# Patient Record
Sex: Female | Born: 1950 | Race: White | Hispanic: No | Marital: Married | Smoking: Former smoker
Health system: Southern US, Community
[De-identification: ages and names within clinical notes are randomized; demographics above are authoritative.]

## PROBLEM LIST (undated history)

## (undated) DIAGNOSIS — A6920 Lyme disease, unspecified: Secondary | ICD-10-CM

## (undated) DIAGNOSIS — M797 Fibromyalgia: Secondary | ICD-10-CM

## (undated) DIAGNOSIS — I499 Cardiac arrhythmia, unspecified: Secondary | ICD-10-CM

## (undated) DIAGNOSIS — S161XXA Strain of muscle, fascia and tendon at neck level, initial encounter: Secondary | ICD-10-CM

## (undated) DIAGNOSIS — R51 Headache: Secondary | ICD-10-CM

## (undated) DIAGNOSIS — C801 Malignant (primary) neoplasm, unspecified: Secondary | ICD-10-CM

## (undated) DIAGNOSIS — E039 Hypothyroidism, unspecified: Secondary | ICD-10-CM

## (undated) DIAGNOSIS — J84112 Idiopathic pulmonary fibrosis: Secondary | ICD-10-CM

## (undated) DIAGNOSIS — Z720 Tobacco use: Secondary | ICD-10-CM

## (undated) DIAGNOSIS — Z8739 Personal history of other diseases of the musculoskeletal system and connective tissue: Secondary | ICD-10-CM

## (undated) DIAGNOSIS — I739 Peripheral vascular disease, unspecified: Secondary | ICD-10-CM

## (undated) DIAGNOSIS — I8290 Acute embolism and thrombosis of unspecified vein: Secondary | ICD-10-CM

## (undated) DIAGNOSIS — Z87898 Personal history of other specified conditions: Secondary | ICD-10-CM

## (undated) DIAGNOSIS — J8410 Pulmonary fibrosis, unspecified: Secondary | ICD-10-CM

## (undated) DIAGNOSIS — K219 Gastro-esophageal reflux disease without esophagitis: Secondary | ICD-10-CM

## (undated) DIAGNOSIS — E785 Hyperlipidemia, unspecified: Secondary | ICD-10-CM

## (undated) DIAGNOSIS — I10 Essential (primary) hypertension: Secondary | ICD-10-CM

## (undated) HISTORY — DX: Acute embolism and thrombosis of unspecified vein: I82.90

## (undated) HISTORY — DX: Personal history of other specified conditions: Z87.898

## (undated) HISTORY — DX: Gastro-esophageal reflux disease without esophagitis: K21.9

## (undated) HISTORY — PX: VAGINA SURGERY: SHX829

## (undated) HISTORY — PX: TENDON REPAIR: SHX5111

## (undated) HISTORY — DX: Essential (primary) hypertension: I10

## (undated) HISTORY — PX: ABDOMINAL HYSTERECTOMY: SHX81

## (undated) HISTORY — PX: LUMBAR SPINE SURGERY: SHX701

## (undated) HISTORY — DX: Tobacco use: Z72.0

## (undated) HISTORY — DX: Hypothyroidism, unspecified: E03.9

## (undated) HISTORY — DX: Hyperlipidemia, unspecified: E78.5

## (undated) HISTORY — DX: Peripheral vascular disease, unspecified: I73.9

## (undated) HISTORY — PX: TUBAL LIGATION: SHX77

## (undated) HISTORY — PX: TONSILLECTOMY: SUR1361

## (undated) HISTORY — PX: OVARY SURGERY: SHX727

## (undated) HISTORY — DX: Headache: R51

## (undated) HISTORY — PX: ULNAR NERVE TRANSPOSITION: SHX2595

## (undated) HISTORY — DX: Idiopathic pulmonary fibrosis: J84.112

## (undated) HISTORY — DX: Cardiac arrhythmia, unspecified: I49.9

## (undated) HISTORY — DX: Malignant (primary) neoplasm, unspecified: C80.1

## (undated) HISTORY — DX: Strain of muscle, fascia and tendon at neck level, initial encounter: S16.1XXA

## (undated) HISTORY — DX: Lyme disease, unspecified: A69.20

## (undated) HISTORY — DX: Fibromyalgia: M79.7

## (undated) HISTORY — PX: VULVECTOMY PARTIAL: SHX6187

## (undated) HISTORY — DX: Personal history of other diseases of the musculoskeletal system and connective tissue: Z87.39

---

## 1988-12-05 HISTORY — PX: BREAST BIOPSY: SHX20

## 2004-11-02 ENCOUNTER — Ambulatory Visit: Payer: Self-pay

## 2005-12-22 ENCOUNTER — Ambulatory Visit: Payer: Self-pay

## 2006-03-31 ENCOUNTER — Ambulatory Visit: Payer: Self-pay

## 2007-09-18 ENCOUNTER — Ambulatory Visit: Payer: Self-pay

## 2008-03-25 ENCOUNTER — Ambulatory Visit: Payer: Self-pay

## 2008-07-25 ENCOUNTER — Encounter: Admission: RE | Admit: 2008-07-25 | Discharge: 2008-07-25 | Payer: Self-pay | Admitting: Neurology

## 2008-10-10 ENCOUNTER — Encounter: Admission: RE | Admit: 2008-10-10 | Discharge: 2008-10-10 | Payer: Self-pay | Admitting: Neurology

## 2008-12-29 ENCOUNTER — Encounter: Admission: RE | Admit: 2008-12-29 | Discharge: 2008-12-29 | Payer: Self-pay | Admitting: Neurology

## 2009-01-08 ENCOUNTER — Encounter: Admission: RE | Admit: 2009-01-08 | Discharge: 2009-01-08 | Payer: Self-pay | Admitting: Neurology

## 2009-03-20 ENCOUNTER — Encounter: Admission: RE | Admit: 2009-03-20 | Discharge: 2009-03-20 | Payer: Self-pay | Admitting: Neurology

## 2010-03-15 ENCOUNTER — Ambulatory Visit: Payer: Self-pay

## 2011-03-18 ENCOUNTER — Ambulatory Visit: Payer: Self-pay

## 2013-01-25 DIAGNOSIS — M47817 Spondylosis without myelopathy or radiculopathy, lumbosacral region: Secondary | ICD-10-CM | POA: Insufficient documentation

## 2013-01-25 DIAGNOSIS — R5382 Chronic fatigue, unspecified: Secondary | ICD-10-CM | POA: Insufficient documentation

## 2013-01-25 DIAGNOSIS — G039 Meningitis, unspecified: Secondary | ICD-10-CM | POA: Insufficient documentation

## 2013-01-25 DIAGNOSIS — Z5181 Encounter for therapeutic drug level monitoring: Secondary | ICD-10-CM | POA: Insufficient documentation

## 2013-01-25 DIAGNOSIS — M79609 Pain in unspecified limb: Secondary | ICD-10-CM | POA: Insufficient documentation

## 2013-01-25 DIAGNOSIS — M4800 Spinal stenosis, site unspecified: Secondary | ICD-10-CM | POA: Insufficient documentation

## 2013-01-25 DIAGNOSIS — IMO0001 Reserved for inherently not codable concepts without codable children: Secondary | ICD-10-CM | POA: Insufficient documentation

## 2013-02-14 ENCOUNTER — Ambulatory Visit: Payer: Self-pay

## 2013-05-01 ENCOUNTER — Encounter: Payer: Self-pay | Admitting: Neurology

## 2013-05-09 ENCOUNTER — Ambulatory Visit: Payer: Self-pay

## 2013-06-10 ENCOUNTER — Encounter: Payer: Self-pay | Admitting: Neurology

## 2013-06-10 DIAGNOSIS — Z5181 Encounter for therapeutic drug level monitoring: Secondary | ICD-10-CM

## 2013-06-10 DIAGNOSIS — M47817 Spondylosis without myelopathy or radiculopathy, lumbosacral region: Secondary | ICD-10-CM

## 2013-06-10 DIAGNOSIS — G039 Meningitis, unspecified: Secondary | ICD-10-CM

## 2013-06-10 DIAGNOSIS — M79609 Pain in unspecified limb: Secondary | ICD-10-CM

## 2013-06-10 DIAGNOSIS — R5382 Chronic fatigue, unspecified: Secondary | ICD-10-CM

## 2013-06-10 DIAGNOSIS — M4800 Spinal stenosis, site unspecified: Secondary | ICD-10-CM

## 2013-06-10 DIAGNOSIS — IMO0001 Reserved for inherently not codable concepts without codable children: Secondary | ICD-10-CM

## 2013-06-11 ENCOUNTER — Telehealth: Payer: Self-pay | Admitting: Neurology

## 2013-06-11 ENCOUNTER — Encounter: Payer: Self-pay | Admitting: Neurology

## 2013-06-11 ENCOUNTER — Ambulatory Visit (INDEPENDENT_AMBULATORY_CARE_PROVIDER_SITE_OTHER): Payer: BC Managed Care – PPO | Admitting: Neurology

## 2013-06-11 VITALS — BP 120/71 | HR 95 | Ht 64.5 in | Wt 134.0 lb

## 2013-06-11 DIAGNOSIS — Z5181 Encounter for therapeutic drug level monitoring: Secondary | ICD-10-CM

## 2013-06-11 DIAGNOSIS — M79609 Pain in unspecified limb: Secondary | ICD-10-CM

## 2013-06-11 DIAGNOSIS — IMO0001 Reserved for inherently not codable concepts without codable children: Secondary | ICD-10-CM

## 2013-06-11 LAB — CBC WITH DIFFERENTIAL
Hemoglobin: 11.6 g/dL (ref 11.1–15.9)
Lymphocytes Absolute: 2.4 10*3/uL (ref 0.7–3.1)
MCH: 31.4 pg (ref 26.6–33.0)
MCHC: 34.1 g/dL (ref 31.5–35.7)
Monocytes: 6 % (ref 4–12)
Neutrophils Absolute: 5 10*3/uL (ref 1.4–7.0)
Platelets: 333 10*3/uL (ref 150–379)

## 2013-06-11 LAB — COMPREHENSIVE METABOLIC PANEL
AST: 15 IU/L (ref 0–40)
Albumin/Globulin Ratio: 2.1 (ref 1.1–2.5)
Albumin: 4.4 g/dL (ref 3.6–4.8)
BUN: 14 mg/dL (ref 8–27)
CO2: 32 mmol/L — ABNORMAL HIGH (ref 18–29)
Chloride: 93 mmol/L — ABNORMAL LOW (ref 96–108)
GFR calc Af Amer: 89 mL/min/{1.73_m2} (ref >59)
GFR calc non Af Amer: 77 mL/min/{1.73_m2} (ref >59)
Globulin, Total: 2.1 g/dL (ref 1.5–4.5)
Total Bilirubin: 0.1 mg/dL (ref 0.0–1.2)

## 2013-06-11 MED ORDER — BACLOFEN 10 MG PO TABS: 10.0000 mg | ORAL_TABLET | Freq: Every day | ORAL | Status: DC

## 2013-06-11 MED ORDER — OXYCODONE-ACETAMINOPHEN 5-325 MG PO TABS: 1.0000 | ORAL_TABLET | Freq: Four times a day (QID) | ORAL | Status: DC | PRN

## 2013-06-11 MED ORDER — METHOCARBAMOL 500 MG PO TABS
500.0000 mg | ORAL_TABLET | Freq: Two times a day (BID) | ORAL | Status: DC | PRN
Start: 2013-06-11 — End: 2014-05-09

## 2013-06-11 NOTE — Progress Notes (Signed)
Reason for visit: Fibromyalgia  Kelli Morris is an 62 y.o. female  History of present illness:  Kelli Morris is a 62 year old right-handed white female with a history of fibromyalgia. The patient complains of almost nightly nocturnal leg cramps. The patient is on Flexeril at night, and she may take Robaxin during the daytime. The patient is also on magnesium supplementation. The patient is not sleeping well because of the cramps. The patient also has noted some increased numbness on the lateral aspect of the left leg below the knee including the dorsum of the left foot. The last EMG and nerve conduction study was in 2009, and evaluation of the legs were normal on nerve conductions, and EMG of the left leg was normal. The patient does have back pain, and lancinating pains in the legs, left greater than right. The patient is on carbamazepine for this, which is helpful to some degree. The patient returns for an evaluation. The patient has been treated for Lyme disease recently. The patient is on a gluten-free diet, and she has lost 20 pounds over the last year, and her cholesterol levels have improved. The patient occasionally will take Percocet if needed for pain.  Past Medical History  Diagnosis Date  . Fibromyalgia     Chronic fatigue  . GERD (gastroesophageal reflux disease)   . History of headache   . Strain, cervical   . History of low back pain   . Hypothyroidism   . Lyme disease   . ZOXWRUEA(540.9)     Past Surgical History  Procedure Laterality Date  . Tonsillectomy    . Tendon repair Right     Hand  . Abdominal hysterectomy    . Tubal ligation Bilateral   . Ovary surgery    . Vulvectomy partial    . Lumbar spine surgery    . Ulnar nerve transposition Left     Family History  Problem Relation Age of Onset  . Uterine cancer Mother   . Leukemia Mother   . Congestive Heart Failure Mother   . COPD Mother   . Diabetes Father   . Heart disease Maternal Grandfather   . Heart  disease Paternal Grandfather     Social history:  reports that she has been smoking Cigarettes.  She has been smoking about 0.50 packs per day. She does not have any smokeless tobacco history on file. She reports that  drinks alcohol. She reports that she does not use illicit drugs.  Allergies: No Known Allergies  Medications:  Current Outpatient Prescriptions on File Prior to Visit  Medication Sig Dispense Refill  . ALPRAZolam (XANAX) 0.5 MG tablet Take 0.5 mg by mouth at bedtime as needed for sleep.      . carbamazepine (TEGRETOL) 100 MG chewable tablet Chew by mouth 2 (two) times daily.      . cholecalciferol (VITAMIN D) 1000 UNITS tablet Take 1,000 Units by mouth daily.      . cyclobenzaprine (FLEXERIL) 10 MG tablet Take 10 mg by mouth daily.      Marland Kitchen esomeprazole (NEXIUM) 40 MG capsule Take 40 mg by mouth daily before breakfast.      . hydrochlorothiazide (HYDRODIURIL) 25 MG tablet Take 25 mg by mouth daily.      . Magnesium Oxide 250 MG TABS Take 250 mg by mouth daily.      . meloxicam (MOBIC) 7.5 MG tablet Take 7.5 mg by mouth 2 (two) times daily.      Marland Kitchen thyroid (ARMOUR) 60  MG tablet Take 60 mg by mouth daily before breakfast. Two tablets daily       No current facility-administered medications on file prior to visit.    ROS:  Out of a complete 14 system review of symptoms, the patient complains only of the following symptoms, and all other reviewed systems are negative.  Weight loss Swelling in the legs Cough Easy bruising, easy bleeding Joint pain, muscle cramps, achy muscles Headache, numbness, weakness, dizziness Anxiety  Blood pressure 120/71, pulse 95, height 5' 4.5" (1.638 m), weight 134 lb (60.782 kg).  Physical Exam  General: The patient is alert and cooperative at the time of the examination.  Neuromuscular: Range of movement of the lumbosacral spine is full.  Skin: No significant peripheral edema is noted.   Neurologic Exam  Cranial nerves: Facial  symmetry is present. Speech is normal, no aphasia or dysarthria is noted. Extraocular movements are full. Visual fields are full.  Motor: The patient has good strength in all 4 extremities. The patient is unable to walk on heel on the left foot, can perform this on the right.  Coordination: The patient has good finger-nose-finger and heel-to-shin bilaterally.  Gait and station: The patient has a normal gait. Tandem gait is slightly unsteady. Romberg is negative. No drift is seen.  Reflexes: Deep tendon reflexes are symmetric, but the ankle jerk reflexes are decreased.   Assessment/Plan:  1. Fibromyalgia  2. Nocturnal leg cramps  3. Back pain, bilateral leg pain, left greater than right  4. History of Lyme disease  The patient will be placed on baclofen at night, 10 mg dose. If this is effective, this will be continued, and the Flexeril will be discontinued. The patient will be sent for blood work today while on carbamazepine, and she will followup in 6 months. A prescription was given for Percocet and for the Robaxin. The patient now is on disability.  Kelli Palau MD 06/11/2013 1:01 PM  [facility] [address] [place], [place] [address]  Phone [phone] Fax [phone]

## 2013-06-11 NOTE — Telephone Encounter (Signed)
I called patient. The blood work reveals a low potassium and a high calcium level. This is likely related to hydrochlorothiazide. The patient will go on potassium supplementation and cut the hydrochlorothiazide to 12.5 mg daily. I'll send her primary care physician the blood work results. The CBC was unremarkable. Treating the potassium level may help the muscle cramps. The patient is not on calcium supplementation.

## 2013-07-04 ENCOUNTER — Ambulatory Visit (INDEPENDENT_AMBULATORY_CARE_PROVIDER_SITE_OTHER): Payer: BC Managed Care – PPO | Admitting: Cardiovascular Disease

## 2013-07-04 ENCOUNTER — Encounter: Payer: Self-pay | Admitting: Cardiovascular Disease

## 2013-07-04 VITALS — BP 110/64 | HR 100 | Ht 63.5 in | Wt 139.5 lb

## 2013-07-04 DIAGNOSIS — I739 Peripheral vascular disease, unspecified: Secondary | ICD-10-CM

## 2013-07-04 DIAGNOSIS — F17200 Nicotine dependence, unspecified, uncomplicated: Secondary | ICD-10-CM

## 2013-07-04 DIAGNOSIS — R0602 Shortness of breath: Secondary | ICD-10-CM

## 2013-07-04 DIAGNOSIS — R600 Localized edema: Secondary | ICD-10-CM

## 2013-07-04 DIAGNOSIS — R0789 Other chest pain: Secondary | ICD-10-CM

## 2013-07-04 DIAGNOSIS — Z720 Tobacco use: Secondary | ICD-10-CM

## 2013-07-04 DIAGNOSIS — R609 Edema, unspecified: Secondary | ICD-10-CM

## 2013-07-04 NOTE — Assessment & Plan Note (Signed)
She reports bilateral calf discomfort worse on the left side with diminished pulses. She has prolonged history of tobacco use. Thus, I will obtain an ABI.

## 2013-07-04 NOTE — Assessment & Plan Note (Signed)
She reports chest heaviness at rest and with physical activities. She has multiple risk factors for coronary artery disease. Baseline ECG is normal. I recommend further evaluation with a pharmacologic nuclear stress test. She's not able to exercise on and off on a treadmill due to leg pain.

## 2013-07-04 NOTE — Assessment & Plan Note (Signed)
I discussed different options for smoking cessation as the patient seems to be interested. I will discuss this further during a followup visit.

## 2013-07-04 NOTE — Assessment & Plan Note (Signed)
I suspect that this is due to chronic venous insufficiency. It is somewhat surprising that she did not improve after the addition of Lasix. I do not see clear signs of heart failure.  jugular venous pressure is not elevated. I will obtain an echocardiogram to evaluate LV systolic/diastolic function and pulmonary pressure. I advised her to elevate her legs frequently during the day and consider using support stockings.

## 2013-07-04 NOTE — Progress Notes (Signed)
Referring provider: Elie Goody at [facility] in [place].  HPI  This is a 62 year old female who is referred for evaluation of lower extremity edema and chest discomfort. She is not aware of any previous cardiac history. She has known history of fibromyalgia and chronic fatigue and sees Dr. Anne Hahn from neurology for this. She has prolonged history of tobacco use and family history of coronary artery disease. She reports having Lyme disease which required prolonged treatment with antibiotics. She went on gluten free diet and lost 40 pounds over the last year. She has been having substernal chest tightness over the last few months which can happen with physical activities and occasionally at rest. She started having lower extremity edema about 2 weeks ago. She was given Lasix 40 mg once daily with no significant improvement in the edema is worse at the end of the day. There is questionable history of atrial fibrillation. She complains of dyspnea with minimal activities. No orthopnea or PND. She also has been having significant leg cramps mostly at night and early morning. This happens at that as well as with short distance of walking. It is slightly worse on the left side than the right side.  She is known to have significantly elevated CRP of unclear etiology. He used to be on hydrochlorothiazide for hypertension but this was stopped as her blood pressure normalized. Recent labs were reviewed which showed normal renal function with a potassium of 3.2 (probably when she was taking Lasix) . Albumin was normal with normal liver profile. CBC was unremarkable.  No Known Allergies   Current Outpatient Prescriptions on File Prior to Visit  Medication Sig Dispense Refill  . ALPRAZolam (XANAX) 0.5 MG tablet Take 0.5 mg by mouth at bedtime as needed for sleep.      . carbamazepine (TEGRETOL) 100 MG chewable tablet Chew by mouth 2 (two) times daily.      . cholecalciferol (VITAMIN D) 1000  UNITS tablet Take 1,000 Units by mouth daily.      . Cyanocobalamin (B-12 PO) Take by mouth 4 (four) times a week.      . Cyanocobalamin 1000 MCG/ML KIT Inject as directed 3 (three) times a week.      . cyclobenzaprine (FLEXERIL) 10 MG tablet Take 10 mg by mouth daily.      Marland Kitchen esomeprazole (NEXIUM) 40 MG capsule Take 40 mg by mouth daily before breakfast.      . Magnesium Oxide 250 MG TABS Take 250 mg by mouth daily.      . meloxicam (MOBIC) 7.5 MG tablet Take 7.5 mg by mouth 2 (two) times daily.      . methocarbamol (ROBAXIN) 500 MG tablet Take 1 tablet (500 mg total) by mouth 2 (two) times daily as needed.  60 tablet  5  . oxyCODONE-acetaminophen (PERCOCET/ROXICET) 5-325 MG per tablet Take 1 tablet by mouth every 6 (six) hours as needed for pain (Must last 28 days).  30 tablet  0  . thyroid (ARMOUR) 60 MG tablet Take 60 mg by mouth daily before breakfast. Two tablets daily       No current facility-administered medications on file prior to visit.     Past Medical History  Diagnosis Date  . Fibromyalgia     Chronic fatigue  . GERD (gastroesophageal reflux disease)   . History of headache   . Strain, cervical   . History of low back pain   . Hypothyroidism   . Lyme disease   . Headache(784.0)   .  Hypertension   . Hyperlipidemia   . Thrombosis   . Cancer     uterine  . Arrhythmia     afib     Past Surgical History  Procedure Laterality Date  . Tonsillectomy    . Tendon repair Right     Hand  . Abdominal hysterectomy    . Tubal ligation Bilateral   . Ovary surgery    . Vulvectomy partial    . Lumbar spine surgery    . Ulnar nerve transposition Left      Family History  Problem Relation Age of Onset  . Uterine cancer Mother   . Leukemia Mother   . Congestive Heart Failure Mother   . COPD Mother   . Diabetes Father   . Congestive Heart Failure Father   . Heart disease Maternal Grandfather   . Heart disease Paternal Grandfather      History   Social History    . Marital Status: Married    Spouse Name: N/A    Number of Children: 2  . Years of Education: 14   Occupational History  . Unemployed     Disability   Social History Main Topics  . Smoking status: Current Every Day Smoker -- 0.50 packs/day for 45 years    Types: Cigarettes  . Smokeless tobacco: Not on file  . Alcohol Use: Yes     Comment: Consumes alcohol on occasion  . Drug Use: No  . Sexually Active: Not on file   Other Topics Concern  . Not on file   Social History Narrative  . No narrative on file     ROS Constitutional: Negative for fever, chills, diaphoresis, activity change, appetite change and fatigue.  HENT: Negative for hearing loss, nosebleeds, congestion, sore throat, facial swelling, drooling, trouble swallowing, neck pain, voice change, sinus pressure and tinnitus.  Eyes: Negative for photophobia, pain, discharge and visual disturbance.  Respiratory: Negative for apnea, cough and wheezing.  Cardiovascular: Negative for  palpitations . Gastrointestinal: Negative for nausea, vomiting, abdominal pain, diarrhea, constipation, blood in stool and abdominal distention.  Genitourinary: Negative for dysuria, urgency, frequency, hematuria and decreased urine volume.  Musculoskeletal: Negative for myalgias, back pain, joint swelling, arthralgias and gait problem.  Skin: Negative for color change, pallor, rash and wound.  Neurological: Negative for dizziness, tremors, seizures, syncope, speech difficulty, weakness, light-headedness, numbness and headaches.  Psychiatric/Behavioral: Negative for suicidal ideas, hallucinations, behavioral problems and agitation. The patient is not nervous/anxious.     PHYSICAL EXAM   BP 110/64  Pulse 100  Ht 5' 3.5" (1.613 m)  Wt 139 lb 8 oz (63.277 kg)  BMI 24.32 kg/m2 Constitutional: She is oriented to person, place, and time. She appears well-developed and well-nourished. No distress.  HENT: No nasal discharge.  Head:  Normocephalic and atraumatic.  Eyes: Pupils are equal and round. Right eye exhibits no discharge. Left eye exhibits no discharge.  Neck: Normal range of motion. Neck supple. No JVD present. No thyromegaly present. No carotid bruits. Cardiovascular: Normal rate, regular rhythm, normal heart sounds. Exam reveals no gallop and no friction rub. No murmur heard.  Pulmonary/Chest: Effort normal and breath sounds normal. No stridor. No respiratory distress. She has no wheezes. She has no rales. She exhibits no tenderness.  Abdominal: Soft. Bowel sounds are normal. She exhibits no distension. There is no tenderness. There is no rebound and no guarding.  Musculoskeletal: Normal range of motion. She exhibits +2 edema and no tenderness.  Neurological: She is alert and  oriented to person, place, and time. Coordination normal.  Skin: Skin is warm and dry. No rash noted. She is not diaphoretic. No erythema. No pallor.  Psychiatric: She has a normal mood and affect. Her behavior is normal. Judgment and thought content normal.  Distal pulses are not palpable.   ZOX:WRUEAV sinus rhythm   ASSESSMENT AND PLAN

## 2013-07-04 NOTE — Patient Instructions (Addendum)
Your physician has requested that you have a lexiscan myoview. For further information please visit [email or web address] Please follow instruction sheet, as given.  Your physician has requested that you have an echocardiogram. Echocardiography is a painless test that uses sound waves to create images of your heart. It provides your doctor with information about the size and shape of your heart and how well your heart's chambers and valves are working. This procedure takes approximately one hour. There are no restrictions for this procedure.  Your physician has requested that you have an ankle brachial index (ABI). During this test an ultrasound and blood pressure cuff are used to evaluate the arteries that supply the arms and legs with blood. Allow thirty minutes for this exam. There are no restrictions or special instructions.  Follow up after tests.   [facility] MYOVIEW  Your caregiver has ordered a Stress Test with nuclear imaging. The purpose of this test is to evaluate the blood supply to your heart muscle. This procedure is referred to as a "Non-Invasive Stress Test." This is because other than having an IV started in your vein, nothing is inserted or "invades" your body. Cardiac stress tests are done to find areas of poor blood flow to the heart by determining the extent of coronary artery disease (CAD). Some patients exercise on a treadmill, which naturally increases the blood flow to your heart, while others who are  unable to walk on a treadmill due to physical limitations have a pharmacologic/chemical stress agent called Lexiscan . This medicine will mimic walking on a treadmill by temporarily increasing your coronary blood flow.   Please note: these test may take anywhere between 2-4 hours to complete  PLEASE REPORT TO [facility] MEDICAL MALL ENTRANCE  THE VOLUNTEERS AT THE FIRST DESK WILL DIRECT YOU WHERE TO GO  Date of Procedure:_______08/04/14______________________________  Arrival Time for  Procedure:______7am________________________  Instructions regarding medication: You can take all medications as normal with a sip of water.    PLEASE NOTIFY THE OFFICE AT LEAST 24 HOURS IN ADVANCE IF YOU ARE UNABLE TO KEEP YOUR APPOINTMENT.  [phone] AND  PLEASE NOTIFY NUCLEAR MEDICINE AT [facility] AT LEAST 24 HOURS IN ADVANCE IF YOU ARE UNABLE TO KEEP YOUR APPOINTMENT. [phone]  How to prepare for your Myoview test:  1. Do not eat or drink after midnight 2. No caffeine for 24 hours prior to test 3. No smoking 24 hours prior to test. 4. Your medication may be taken with water.  If your doctor stopped a medication because of this test, do not take that medication. 5. Ladies, please do not wear dresses.  Skirts or pants are appropriate. Please wear a short sleeve shirt. 6. No perfume, cologne or lotion. 7. Wear comfortable walking shoes. No heels!

## 2013-07-08 ENCOUNTER — Ambulatory Visit: Payer: Self-pay | Admitting: Cardiovascular Disease

## 2013-07-08 ENCOUNTER — Other Ambulatory Visit: Payer: Self-pay

## 2013-07-08 DIAGNOSIS — R0789 Other chest pain: Secondary | ICD-10-CM

## 2013-07-08 DIAGNOSIS — R0602 Shortness of breath: Secondary | ICD-10-CM

## 2013-07-10 ENCOUNTER — Other Ambulatory Visit: Payer: Self-pay | Admitting: Neurology

## 2013-07-26 ENCOUNTER — Other Ambulatory Visit: Payer: Self-pay

## 2013-07-26 ENCOUNTER — Other Ambulatory Visit (INDEPENDENT_AMBULATORY_CARE_PROVIDER_SITE_OTHER): Payer: BC Managed Care – PPO

## 2013-07-26 DIAGNOSIS — R609 Edema, unspecified: Secondary | ICD-10-CM

## 2013-07-26 DIAGNOSIS — R0602 Shortness of breath: Secondary | ICD-10-CM

## 2013-07-31 ENCOUNTER — Encounter (INDEPENDENT_AMBULATORY_CARE_PROVIDER_SITE_OTHER): Payer: BC Managed Care – PPO

## 2013-07-31 DIAGNOSIS — I70219 Atherosclerosis of native arteries of extremities with intermittent claudication, unspecified extremity: Secondary | ICD-10-CM

## 2013-07-31 DIAGNOSIS — I739 Peripheral vascular disease, unspecified: Secondary | ICD-10-CM

## 2013-08-01 ENCOUNTER — Telehealth: Payer: Self-pay

## 2013-08-01 NOTE — Telephone Encounter (Signed)
Message copied by Marilynne Halsted on Thu Aug 01, 2013  1:38 PM ------      Message from: Lorine Bears A      Created: Thu Aug 01, 2013 11:45 AM       Duplex showed significant blockage in the right leg. Make sure she keeps her follow up appointment so I can discuss treatment options. ------

## 2013-08-06 ENCOUNTER — Ambulatory Visit (INDEPENDENT_AMBULATORY_CARE_PROVIDER_SITE_OTHER): Payer: BC Managed Care – PPO | Admitting: Cardiovascular Disease

## 2013-08-06 ENCOUNTER — Encounter: Payer: Self-pay | Admitting: Cardiovascular Disease

## 2013-08-06 VITALS — BP 114/68 | HR 80 | Ht 63.5 in | Wt 135.8 lb

## 2013-08-06 DIAGNOSIS — R609 Edema, unspecified: Secondary | ICD-10-CM

## 2013-08-06 DIAGNOSIS — Z720 Tobacco use: Secondary | ICD-10-CM

## 2013-08-06 DIAGNOSIS — R600 Localized edema: Secondary | ICD-10-CM

## 2013-08-06 DIAGNOSIS — I739 Peripheral vascular disease, unspecified: Secondary | ICD-10-CM

## 2013-08-06 DIAGNOSIS — F17200 Nicotine dependence, unspecified, uncomplicated: Secondary | ICD-10-CM

## 2013-08-06 DIAGNOSIS — R0789 Other chest pain: Secondary | ICD-10-CM

## 2013-08-06 MED ORDER — CILOSTAZOL 50 MG PO TABS: 50.0000 mg | ORAL_TABLET | Freq: Two times a day (BID) | ORAL | Status: DC

## 2013-08-06 MED ORDER — VARENICLINE TARTRATE 1 MG PO TABS: 1.0000 mg | ORAL_TABLET | Freq: Two times a day (BID) | ORAL | Status: DC

## 2013-08-06 MED ORDER — VARENICLINE TARTRATE 0.5 MG X 11 & 1 MG X 42 PO MISC: ORAL | Status: DC

## 2013-08-06 NOTE — Assessment & Plan Note (Addendum)
A lot of her symptoms seem to be related to spinal stenosis. However, she clearly has claudication affecting the right calf more than the left calf with evidence of severe right SFA disease. I discussed with her management options including medical therapy with a walking program versus proceeding with angiography and possible endovascular intervention. She prefers to wait on procedures at this time. I will start her on cilostazol 50 mg twice daily. I instructed her to start a walking program and strongly stressed the importance of smoking cessation. I will reevaluate her symptoms in 3 months.

## 2013-08-06 NOTE — Assessment & Plan Note (Signed)
This is likely due to chronic venous insufficiency. Continue with leg elevation. I advised him to start using knee-high support stockings low- medium pressure.

## 2013-08-06 NOTE — Progress Notes (Signed)
Referring provider: Elie Goody at [facility] in [place].  HPI  This is a 62 year old female who is here today for a followup visit regarding lower extremity edema and chest discomfort.  She has known history of fibromyalgia and chronic fatigue and sees Dr. Anne Hahn from neurology for this. She has prolonged history of tobacco use and family history of coronary artery disease. She reports having Lyme disease which required prolonged treatment with antibiotics. She also had recent lower extremity edema that did not respond well to diuretics. She underwent evaluation with a nuclear stress test which showed no evidence of ischemia. Echocardiogram was also unremarkable with normal LV systolic and only grade 1 diastolic dysfunction with no significant pulmonary hypertension. for lower extremity edema, I instructed her to elevate her legs frequently during the day and use support stockings. The edema overall improved. Due to bilateral leg pain, I proceeded with an ABI which was abnormal at 0.78 on the right side and 0.87 on the left side. Duplex ultrasound showed severe stenosis in the right distal SFA and mild diffuse disease in the left SFA.  The patient has significant back discomfort related to spinal stenosis with significant numbness. He did difficult to differentiate that from claudication but she clearly has recurrent cramps in the right calf with walking and at night which forces her to wake up multiple times.   No Known Allergies   Current Outpatient Prescriptions on File Prior to Visit  Medication Sig Dispense Refill  . ALPRAZolam (XANAX) 0.5 MG tablet Take 0.5 mg by mouth at bedtime as needed for sleep.      . baclofen (LIORESAL) 10 MG tablet TAKE 1 TABLET (10 MG TOTAL) BY MOUTH AT BEDTIME.  30 tablet  3  . carbamazepine (TEGRETOL) 100 MG chewable tablet Chew by mouth 2 (two) times daily.      . cholecalciferol (VITAMIN D) 1000 UNITS tablet Take 1,000 Units by mouth daily.       . Cyanocobalamin (B-12 PO) Take by mouth 4 (four) times a week.      . Cyanocobalamin 1000 MCG/ML KIT Inject as directed 3 (three) times a week.      . cyclobenzaprine (FLEXERIL) 10 MG tablet Take 10 mg by mouth daily.      Marland Kitchen esomeprazole (NEXIUM) 40 MG capsule Take 40 mg by mouth daily before breakfast.      . Magnesium Oxide 250 MG TABS Take 250 mg by mouth daily.      . meloxicam (MOBIC) 7.5 MG tablet Take 7.5 mg by mouth 2 (two) times daily.      . methocarbamol (ROBAXIN) 500 MG tablet Take 1 tablet (500 mg total) by mouth 2 (two) times daily as needed.  60 tablet  5  . oxyCODONE-acetaminophen (PERCOCET/ROXICET) 5-325 MG per tablet Take 1 tablet by mouth every 6 (six) hours as needed for pain (Must last 28 days).  30 tablet  0  . thyroid (ARMOUR) 60 MG tablet Take 60 mg by mouth daily before breakfast. Two tablets daily       No current facility-administered medications on file prior to visit.     Past Medical History  Diagnosis Date  . Fibromyalgia     Chronic fatigue  . GERD (gastroesophageal reflux disease)   . History of headache   . Strain, cervical   . History of low back pain   . Hypothyroidism   . Lyme disease   . Headache(784.0)   . Hypertension   . Hyperlipidemia   .  Thrombosis   . Cancer     uterine  . Arrhythmia     afib  . Tobacco use      Past Surgical History  Procedure Laterality Date  . Tonsillectomy    . Tendon repair Right     Hand  . Abdominal hysterectomy    . Tubal ligation Bilateral   . Ovary surgery    . Vulvectomy partial    . Lumbar spine surgery    . Ulnar nerve transposition Left      Family History  Problem Relation Age of Onset  . Uterine cancer Mother   . Leukemia Mother   . Congestive Heart Failure Mother   . COPD Mother   . Diabetes Father   . Congestive Heart Failure Father   . Heart disease Maternal Grandfather   . Heart disease Paternal Grandfather      History   Social History  . Marital Status: Married     Spouse Name: N/A    Number of Children: 2  . Years of Education: 14   Occupational History  . Unemployed     Disability   Social History Main Topics  . Smoking status: Current Every Day Smoker -- 0.50 packs/day for 45 years    Types: Cigarettes  . Smokeless tobacco: Not on file  . Alcohol Use: Yes     Comment: Consumes alcohol on occasion  . Drug Use: No  . Sexual Activity: Not on file   Other Topics Concern  . Not on file   Social History Narrative  . No narrative on file       PHYSICAL EXAM   BP 114/68  Pulse 80  Ht 5' 3.5" (1.613 m)  Wt 135 lb 12 oz (61.576 kg)  BMI 23.67 kg/m2 Constitutional: She is oriented to person, place, and time. She appears well-developed and well-nourished. No distress.  HENT: No nasal discharge.  Head: Normocephalic and atraumatic.  Eyes: Pupils are equal and round. Right eye exhibits no discharge. Left eye exhibits no discharge.  Neck: Normal range of motion. Neck supple. No JVD present. No thyromegaly present. No carotid bruits. Cardiovascular: Normal rate, regular rhythm, normal heart sounds. Exam reveals no gallop and no friction rub. No murmur heard.  Pulmonary/Chest: Effort normal and breath sounds normal. No stridor. No respiratory distress. She has no wheezes. She has no rales. She exhibits no tenderness.  Abdominal: Soft. Bowel sounds are normal. She exhibits no distension. There is no tenderness. There is no rebound and no guarding.  Musculoskeletal: Normal range of motion. She exhibits +2 edema and no tenderness.  Neurological: She is alert and oriented to person, place, and time. Coordination normal.  Skin: Skin is warm and dry. No rash noted. She is not diaphoretic. No erythema. No pallor.  Psychiatric: She has a normal mood and affect. Her behavior is normal. Judgment and thought content normal.  Distal pulses are not palpable on the right side. Dorsalis pedis is +1 on the left side. Femoral pulses are normal    ASSESSMENT  AND PLAN

## 2013-08-06 NOTE — Assessment & Plan Note (Signed)
Recent stress test showed no evidence of ischemia with normal ejection fraction. Continue treatment of risk factors.

## 2013-08-06 NOTE — Patient Instructions (Addendum)
Start Cilostazol 50 mg twice daily for circulation Start Chantix as instructed to help you quit smoking (stop smoking after 1 week of starting the medication) Start walking for 30 minutes at least 4-5 time /week Use knee high support stockings to help swelling.  Follow up in 3 months.

## 2013-08-06 NOTE — Assessment & Plan Note (Signed)
Given her recent diagnosis of peripheral arterial disease, smoking cessation is now a priority. I discussed this extensively with her and she wishes to quit smoking but she needs help. I prescribed Chantix and explained all side effects.

## 2013-08-07 ENCOUNTER — Telehealth: Payer: Self-pay

## 2013-08-07 NOTE — Telephone Encounter (Signed)
Pt came in for visit w/ Dr. Kirke Corin to discuss.

## 2013-08-09 ENCOUNTER — Other Ambulatory Visit: Payer: Self-pay | Admitting: Neurology

## 2013-11-08 ENCOUNTER — Ambulatory Visit (INDEPENDENT_AMBULATORY_CARE_PROVIDER_SITE_OTHER): Payer: BC Managed Care – PPO | Admitting: Cardiovascular Disease

## 2013-11-08 ENCOUNTER — Encounter: Payer: Self-pay | Admitting: Cardiovascular Disease

## 2013-11-08 VITALS — BP 137/87 | HR 93 | Ht 63.5 in | Wt 141.8 lb

## 2013-11-08 DIAGNOSIS — R600 Localized edema: Secondary | ICD-10-CM

## 2013-11-08 DIAGNOSIS — R42 Dizziness and giddiness: Secondary | ICD-10-CM

## 2013-11-08 DIAGNOSIS — R609 Edema, unspecified: Secondary | ICD-10-CM

## 2013-11-08 DIAGNOSIS — I739 Peripheral vascular disease, unspecified: Secondary | ICD-10-CM

## 2013-11-08 NOTE — Patient Instructions (Signed)
Continue same medications.   Your physician wants you to follow-up in: 6 months.  You will receive a reminder letter in the mail two months in advance. If you don't receive a letter, please call our office to schedule the follow-up appointment.  

## 2013-11-08 NOTE — Progress Notes (Signed)
Referring provider: Elie Goody at [facility] in [place].  HPI  This is a 62 year old female who is here today for a followup visit regarding lower extremity edema, chest discomfort and peripheral arterial disease.  She has known history of fibromyalgia and chronic fatigue and sees Dr. Anne Hahn from neurology for this. She has prolonged history of tobacco use and family history of coronary artery disease. She reports having Lyme disease which required prolonged treatment with antibiotics.  Nuclear stress test in 06/2013 showed no evidence of ischemia. Echocardiogram was also unremarkable with normal LV systolic and only grade 1 diastolic dysfunction with no significant pulmonary hypertension. for lower extremity edema, I instructed her to elevate her legs frequently during the day and use support stockings. The edema overall improved. Due to bilateral leg pain, I proceeded with an ABI which was abnormal at 0.78 on the right side and 0.87 on the left side. Duplex ultrasound showed severe stenosis in the right distal SFA and mild diffuse disease in the left SFA.  The patient has significant back discomfort related to spinal stenosis with significant numbness. It was difficult to differentiate that from claudication but she clearly has recurrent cramps in the right calf with walking and at night which forces her to wake up multiple times. During last visit, I started her on cilostazol 50 mg twice daily as well as Chantix for smoking cessation. She reports significant improvement in cramps in her calves. She was doing well on smoking cessation with her father died in 04-Oct-2023 which cause significant stress and relapse in tobacco use. She has been feeling dizzy lately with no palpitations, chest pain or shortness of breath.   No Known Allergies   Current Outpatient Prescriptions on File Prior to Visit  Medication Sig Dispense Refill  . ALPRAZolam (XANAX) 0.5 MG tablet Take 0.5 mg by  mouth at bedtime as needed for sleep.      . Aspirin-Caffeine 1000-65 MG PACK Take by mouth as needed.      . baclofen (LIORESAL) 10 MG tablet TAKE 1 TABLET (10 MG TOTAL) BY MOUTH AT BEDTIME.  30 tablet  3  . carbamazepine (TEGRETOL) 100 MG chewable tablet Chew by mouth 2 (two) times daily.      . cholecalciferol (VITAMIN D) 1000 UNITS tablet Take 1,000 Units by mouth daily.      . cilostazol (PLETAL) 50 MG tablet Take 1 tablet (50 mg total) by mouth 2 (two) times daily.  60 tablet  6  . Cyanocobalamin (B-12 PO) Take by mouth 4 (four) times a week.      . Cyanocobalamin 1000 MCG/ML KIT Inject as directed 3 (three) times a week.      . cyclobenzaprine (FLEXERIL) 10 MG tablet Take 10 mg by mouth daily.      Marland Kitchen esomeprazole (NEXIUM) 40 MG capsule Take 40 mg by mouth daily before breakfast.      . Magnesium Oxide 250 MG TABS Take 250 mg by mouth daily.      . meloxicam (MOBIC) 7.5 MG tablet Take 7.5 mg by mouth 2 (two) times daily.      . methocarbamol (ROBAXIN) 500 MG tablet Take 1 tablet (500 mg total) by mouth 2 (two) times daily as needed.  60 tablet  5  . oxyCODONE-acetaminophen (PERCOCET/ROXICET) 5-325 MG per tablet Take 1 tablet by mouth every 6 (six) hours as needed for pain (Must last 28 days).  30 tablet  0  . thyroid (ARMOUR) 60 MG tablet Take 60  mg by mouth daily before breakfast. Two tablets daily      . varenicline (CHANTIX CONTINUING MONTH PAK) 1 MG tablet Take 1 tablet (1 mg total) by mouth 2 (two) times daily.  60 tablet  2  . varenicline (CHANTIX STARTING MONTH PAK) 0.5 MG X 11 & 1 MG X 42 tablet Take one 0.5 mg tablet by mouth once daily for 3 days, then increase to one 0.5 mg tablet twice daily for 4 days, then increase to one 1 mg tablet twice daily.  53 tablet  0   No current facility-administered medications on file prior to visit.     Past Medical History  Diagnosis Date  . Fibromyalgia     Chronic fatigue  . GERD (gastroesophageal reflux disease)   . History of headache    . Strain, cervical   . History of low back pain   . Hypothyroidism   . Lyme disease   . Headache(784.0)   . Hypertension   . Hyperlipidemia   . Thrombosis   . Cancer     uterine  . Arrhythmia     afib  . Tobacco use      Past Surgical History  Procedure Laterality Date  . Tonsillectomy    . Tendon repair Right     Hand  . Abdominal hysterectomy    . Tubal ligation Bilateral   . Ovary surgery    . Vulvectomy partial    . Lumbar spine surgery    . Ulnar nerve transposition Left      Family History  Problem Relation Age of Onset  . Uterine cancer Mother   . Leukemia Mother   . Congestive Heart Failure Mother   . COPD Mother   . Diabetes Father   . Congestive Heart Failure Father   . Heart disease Maternal Grandfather   . Heart disease Paternal Grandfather      History   Social History  . Marital Status: Married    Spouse Name: N/A    Number of Children: 2  . Years of Education: 14   Occupational History  . Unemployed     Disability   Social History Main Topics  . Smoking status: Current Every Day Smoker -- 0.50 packs/day for 45 years    Types: Cigarettes  . Smokeless tobacco: Not on file  . Alcohol Use: Yes     Comment: Consumes alcohol on occasion  . Drug Use: No  . Sexual Activity: Not on file   Other Topics Concern  . Not on file   Social History Narrative  . No narrative on file       PHYSICAL EXAM   BP 137/87  Pulse 93  Ht 5' 3.5" (1.613 m)  Wt 141 lb 12 oz (64.297 kg)  BMI 24.71 kg/m2 Constitutional: She is oriented to person, place, and time. She appears well-developed and well-nourished. No distress.  HENT: No nasal discharge.  Head: Normocephalic and atraumatic.  Eyes: Pupils are equal and round. Right eye exhibits no discharge. Left eye exhibits no discharge.  Neck: Normal range of motion. Neck supple. No JVD present. No thyromegaly present. No carotid bruits. Cardiovascular: Normal rate, regular rhythm, normal heart  sounds. Exam reveals no gallop and no friction rub. No murmur heard.  Pulmonary/Chest: Effort normal and breath sounds normal. No stridor. No respiratory distress. She has no wheezes. She has no rales. She exhibits no tenderness.  Abdominal: Soft. Bowel sounds are normal. She exhibits no distension. There is no tenderness.  There is no rebound and no guarding.  Musculoskeletal: Normal range of motion. She exhibits trace edema and no tenderness.  Neurological: She is alert and oriented to person, place, and time. Coordination normal.  Skin: Skin is warm and dry. No rash noted. She is not diaphoretic. No erythema. No pallor.  Psychiatric: She has a normal mood and affect. Her behavior is normal. Judgment and thought content normal.  Distal pulses are not palpable on the right side. Dorsalis pedis is +1 on the left side. Femoral pulses are normal  EKG: Normal sinus rhythm with no significant ST or T wave changes.  ASSESSMENT AND PLAN

## 2013-11-09 ENCOUNTER — Encounter: Payer: Self-pay | Admitting: Cardiovascular Disease

## 2013-11-09 DIAGNOSIS — R42 Dizziness and giddiness: Secondary | ICD-10-CM | POA: Insufficient documentation

## 2013-11-09 NOTE — Assessment & Plan Note (Signed)
Likely due to chronic venous insufficiency which resolved with conservative measures.

## 2013-11-09 NOTE — Assessment & Plan Note (Signed)
A lot of her symptoms seem to be related to spinal stenosis. However, she clearly has claudication affecting the right calf more than the left calf with evidence of severe right SFA disease. Symptoms improved significantly with the addition of cilostazol which will be continued.

## 2013-11-09 NOTE — Assessment & Plan Note (Signed)
Seems to be mild overall with no associated cardiac symptoms. She was not orthostatic by exam today although her heart rate did increase from 85-99. There was no significant drop in blood pressure. I advised her to have routine labs performed including CBC and basic metabolic profile to ensure no anemia or volume depletion. However, she prefers to have these done with her primary care physician as she is due for her physical.

## 2013-11-13 ENCOUNTER — Encounter: Payer: Self-pay | Admitting: *Deleted

## 2013-11-21 ENCOUNTER — Other Ambulatory Visit: Payer: Self-pay

## 2013-11-21 MED ORDER — OXYCODONE-ACETAMINOPHEN 5-325 MG PO TABS: 1.0000 | ORAL_TABLET | Freq: Four times a day (QID) | ORAL | Status: DC | PRN

## 2013-11-21 NOTE — Telephone Encounter (Signed)
Patient called requesting a refill on Percocet.  She would like the Rx mailed to her when it's ready.   

## 2013-11-22 NOTE — Telephone Encounter (Signed)
Rx mailed.

## 2013-12-25 ENCOUNTER — Encounter: Payer: Self-pay | Admitting: Neurology

## 2013-12-25 ENCOUNTER — Encounter (INDEPENDENT_AMBULATORY_CARE_PROVIDER_SITE_OTHER): Payer: Self-pay

## 2013-12-25 ENCOUNTER — Ambulatory Visit (INDEPENDENT_AMBULATORY_CARE_PROVIDER_SITE_OTHER): Payer: BC Managed Care – PPO | Admitting: Neurology

## 2013-12-25 VITALS — BP 129/81 | HR 88 | Ht 64.0 in | Wt 143.0 lb

## 2013-12-25 DIAGNOSIS — IMO0001 Reserved for inherently not codable concepts without codable children: Secondary | ICD-10-CM

## 2013-12-25 DIAGNOSIS — R5382 Chronic fatigue, unspecified: Secondary | ICD-10-CM

## 2013-12-25 DIAGNOSIS — G9332 Myalgic encephalomyelitis/chronic fatigue syndrome: Secondary | ICD-10-CM

## 2013-12-25 MED ORDER — VARENICLINE TARTRATE 0.5 MG PO TABS: ORAL_TABLET | ORAL | Status: DC

## 2013-12-25 MED ORDER — OXYCODONE-ACETAMINOPHEN 5-325 MG PO TABS: 1.0000 | ORAL_TABLET | Freq: Four times a day (QID) | ORAL | Status: DC | PRN

## 2013-12-25 MED ORDER — VARENICLINE TARTRATE 1 MG PO TABS: 1.0000 mg | ORAL_TABLET | Freq: Two times a day (BID) | ORAL | Status: DC

## 2013-12-25 NOTE — Progress Notes (Signed)
Reason for visit: Fibromyalgia  Kelli Morris is an 63 y.o. female  History of present illness:  Kelli Morris is a 63 year old right-handed white female with a history of fibromyalgia. The patient has some issues with degenerative arthritis as well. The patient has recently been diagnosed with peripheral vascular disease, and she continues to smoke about half a pack of cigarettes daily. The patient indicates that she has been under some stress with the loss of her father from progressive supranuclear palsy. The patient has been placed on Pletal with some benefit. The patient indicates that she is taking huge doses of vitamin B12 with oral tablets 4 days a week, and IM injections at 1000 mcg 3 times a week. The patient still has some muscle cramps at night in her legs, but she could not tolerate the baclofen. The patient returns for an evaluation.  Past Medical History  Diagnosis Date  . Fibromyalgia     Chronic fatigue  . GERD (gastroesophageal reflux disease)   . History of headache   . Strain, cervical   . History of low back pain   . Hypothyroidism   . Lyme disease   . Headache(784.0)   . Hypertension   . Hyperlipidemia   . Thrombosis   . Cancer     uterine  . Arrhythmia     afib  . Tobacco use   . PVD (peripheral vascular disease)     Past Surgical History  Procedure Laterality Date  . Tonsillectomy    . Tendon repair Right     Hand  . Abdominal hysterectomy    . Tubal ligation Bilateral   . Ovary surgery    . Vulvectomy partial    . Lumbar spine surgery    . Ulnar nerve transposition Left     Family History  Problem Relation Age of Onset  . Uterine cancer Mother   . Leukemia Mother   . Congestive Heart Failure Mother   . COPD Mother   . Diabetes Father   . Congestive Heart Failure Father   . Heart disease Maternal Grandfather   . Heart disease Paternal Grandfather   . Arthritis Brother   . Neuropathy Brother     Social history:  reports that she has  been smoking Cigarettes.  She has a 22.5 pack-year smoking history. She has never used smokeless tobacco. She reports that she drinks alcohol. She reports that she does not use illicit drugs.   No Known Allergies  Medications:  Current Outpatient Prescriptions on File Prior to Visit  Medication Sig Dispense Refill  . ALPRAZolam (XANAX) 0.5 MG tablet Take 0.5 mg by mouth at bedtime as needed for sleep.      . Aspirin-Caffeine 1000-65 MG PACK Take by mouth as needed.      . carbamazepine (TEGRETOL) 100 MG chewable tablet Chew by mouth 2 (two) times daily.      . cholecalciferol (VITAMIN D) 1000 UNITS tablet Take 1,000 Units by mouth daily.      . cilostazol (PLETAL) 50 MG tablet Take 1 tablet (50 mg total) by mouth 2 (two) times daily.  60 tablet  6  . Cyanocobalamin 1000 MCG/ML KIT Inject 1,000 mcg as directed every 28 (twenty-eight) days.       . cyclobenzaprine (FLEXERIL) 10 MG tablet Take 10 mg by mouth daily.      Marland Kitchen esomeprazole (NEXIUM) 40 MG capsule Take 40 mg by mouth daily before breakfast.      . Magnesium Oxide  250 MG TABS Take 250 mg by mouth daily.      . meloxicam (MOBIC) 7.5 MG tablet Take 7.5 mg by mouth 2 (two) times daily.      . methocarbamol (ROBAXIN) 500 MG tablet Take 1 tablet (500 mg total) by mouth 2 (two) times daily as needed.  60 tablet  5  . thyroid (ARMOUR) 60 MG tablet Take 60 mg by mouth daily before breakfast. Two tablets daily       No current facility-administered medications on file prior to visit.    ROS:  Out of a complete 14 system review of symptoms, the patient complains only of the following symptoms, and all other reviewed systems are negative.  Cough Leg swelling Insomnia, frequent waking Joint pain, back pain, achy muscles, muscle cramps Bruising easily Headache, numbness Nervous  Blood pressure 129/81, pulse 88, height '5\' 4"'  (1.626 m), weight 143 lb (64.864 kg).  Physical Exam  General: The patient is alert and cooperative at the time  of the examination.  Skin: 1-2+ edema in ankles is noted bilaterally.   Neurologic Exam  Mental status: The patient is oriented x 3.  Cranial nerves: Facial symmetry is present. Speech is normal, no aphasia or dysarthria is noted. Extraocular movements are full. Visual fields are full.  Motor: The patient has good strength in all 4 extremities.  Sensory examination: Soft touch sensation in the legs are symmetric, decreased on the right hand as compared to left, symmetric on the face.  Coordination: The patient has good finger-nose-finger and heel-to-shin bilaterally.  Gait and station: The patient has a normal gait. Tandem gait is normal. Romberg is negative. No drift is seen.  Reflexes: Deep tendon reflexes are symmetric.   Assessment/Plan:  One. Fibromyalgia  2. Nocturnal leg cramps  3. Peripheral vascular disease  The patient was given a prescription for Chantix to help her get her off of the cigarettes. The patient was given a prescription for her Percocet, and she is to reduce her vitamin B12 intake with one 1000 mcg IM injection a month. The patient will stop the oral tablets. The patient followup in 6 months.  Jill Alexanders MD 12/25/2013 8:15 PM  [facility] [address] [place], [place] [address]  Phone [phone] Fax [phone]

## 2013-12-25 NOTE — Patient Instructions (Signed)

## 2014-01-25 ENCOUNTER — Other Ambulatory Visit: Payer: Self-pay | Admitting: Neurology

## 2014-03-26 DIAGNOSIS — E039 Hypothyroidism, unspecified: Secondary | ICD-10-CM | POA: Diagnosis not present

## 2014-03-26 DIAGNOSIS — R6889 Other general symptoms and signs: Secondary | ICD-10-CM | POA: Diagnosis not present

## 2014-03-26 DIAGNOSIS — E349 Endocrine disorder, unspecified: Secondary | ICD-10-CM | POA: Diagnosis not present

## 2014-03-26 DIAGNOSIS — R7989 Other specified abnormal findings of blood chemistry: Secondary | ICD-10-CM | POA: Diagnosis not present

## 2014-03-26 DIAGNOSIS — N951 Menopausal and female climacteric states: Secondary | ICD-10-CM | POA: Diagnosis not present

## 2014-03-26 DIAGNOSIS — E559 Vitamin D deficiency, unspecified: Secondary | ICD-10-CM | POA: Diagnosis not present

## 2014-03-26 DIAGNOSIS — R5381 Other malaise: Secondary | ICD-10-CM | POA: Diagnosis not present

## 2014-04-04 ENCOUNTER — Other Ambulatory Visit: Payer: Self-pay | Admitting: Cardiovascular Disease

## 2014-04-07 ENCOUNTER — Other Ambulatory Visit: Payer: Self-pay | Admitting: Neurology

## 2014-04-07 MED ORDER — OXYCODONE-ACETAMINOPHEN 5-325 MG PO TABS: 1.0000 | ORAL_TABLET | Freq: Four times a day (QID) | ORAL | Status: DC | PRN

## 2014-04-07 NOTE — Telephone Encounter (Signed)
Request forwarded to provider for approval  

## 2014-04-07 NOTE — Telephone Encounter (Signed)
Patient was notified that the prescription would be mailed out on today.  Patient verbalized understanding.

## 2014-04-07 NOTE — Telephone Encounter (Signed)
Pt call requesting her written refill on her oxyCODONE-acetaminophen (PERCOCET/ROXICET) 5-325 MG per tablet, please call pt when this has been mailed out. Thanks

## 2014-05-02 DIAGNOSIS — I739 Peripheral vascular disease, unspecified: Secondary | ICD-10-CM | POA: Diagnosis not present

## 2014-05-02 DIAGNOSIS — Z780 Asymptomatic menopausal state: Secondary | ICD-10-CM | POA: Diagnosis not present

## 2014-05-02 DIAGNOSIS — Z8049 Family history of malignant neoplasm of other genital organs: Secondary | ICD-10-CM | POA: Diagnosis not present

## 2014-05-02 DIAGNOSIS — R5381 Other malaise: Secondary | ICD-10-CM | POA: Diagnosis not present

## 2014-05-02 DIAGNOSIS — N739 Female pelvic inflammatory disease, unspecified: Secondary | ICD-10-CM | POA: Diagnosis not present

## 2014-05-02 DIAGNOSIS — N9089 Other specified noninflammatory disorders of vulva and perineum: Secondary | ICD-10-CM | POA: Diagnosis not present

## 2014-05-02 DIAGNOSIS — Z87891 Personal history of nicotine dependence: Secondary | ICD-10-CM | POA: Diagnosis not present

## 2014-05-02 DIAGNOSIS — Z800 Family history of malignant neoplasm of digestive organs: Secondary | ICD-10-CM | POA: Diagnosis not present

## 2014-05-02 DIAGNOSIS — M129 Arthropathy, unspecified: Secondary | ICD-10-CM | POA: Diagnosis not present

## 2014-05-02 DIAGNOSIS — Z87412 Personal history of vulvar dysplasia: Secondary | ICD-10-CM | POA: Diagnosis not present

## 2014-05-02 DIAGNOSIS — R5383 Other fatigue: Secondary | ICD-10-CM | POA: Diagnosis not present

## 2014-05-02 DIAGNOSIS — N952 Postmenopausal atrophic vaginitis: Secondary | ICD-10-CM | POA: Diagnosis not present

## 2014-05-03 DIAGNOSIS — Z87412 Personal history of vulvar dysplasia: Secondary | ICD-10-CM | POA: Diagnosis not present

## 2014-05-09 ENCOUNTER — Ambulatory Visit: Payer: BC Managed Care – PPO | Admitting: Cardiovascular Disease

## 2014-05-09 ENCOUNTER — Encounter: Payer: Self-pay | Admitting: Cardiovascular Disease

## 2014-05-09 ENCOUNTER — Ambulatory Visit (INDEPENDENT_AMBULATORY_CARE_PROVIDER_SITE_OTHER): Payer: Medicare Other | Admitting: Cardiovascular Disease

## 2014-05-09 VITALS — BP 114/68 | HR 86 | Ht 62.5 in | Wt 147.0 lb

## 2014-05-09 DIAGNOSIS — R609 Edema, unspecified: Secondary | ICD-10-CM

## 2014-05-09 DIAGNOSIS — F17200 Nicotine dependence, unspecified, uncomplicated: Secondary | ICD-10-CM | POA: Diagnosis not present

## 2014-05-09 DIAGNOSIS — I739 Peripheral vascular disease, unspecified: Secondary | ICD-10-CM

## 2014-05-09 DIAGNOSIS — I499 Cardiac arrhythmia, unspecified: Secondary | ICD-10-CM | POA: Diagnosis not present

## 2014-05-09 DIAGNOSIS — R600 Localized edema: Secondary | ICD-10-CM

## 2014-05-09 DIAGNOSIS — Z720 Tobacco use: Secondary | ICD-10-CM

## 2014-05-09 NOTE — Patient Instructions (Signed)
Continue same medications.   Your physician wants you to follow-up in: 6 months.  You will receive a reminder letter in the mail two months in advance. If you don't receive a letter, please call our office to schedule the follow-up appointment.  

## 2014-05-09 NOTE — Progress Notes (Signed)
Referring provider: Carroll Kinds at [facility] in [place] [employer].  HPI  This is a 63 year old female who is here today for a followup visit regarding lower extremity edema, chest discomfort and peripheral arterial disease.  She has known history of fibromyalgia and chronic fatigue and sees Dr. Jannifer Franklin from neurology for this. She has prolonged history of tobacco use and family history of coronary artery disease. She reports having Lyme disease which required prolonged treatment with antibiotics.  Nuclear stress test in 06/2013 showed no evidence of ischemia. Echocardiogram was also unremarkable with normal LV systolic and only grade 1 diastolic dysfunction with no significant pulmonary hypertension. for lower extremity edema, I instructed her to elevate her legs frequently during the day and use support stockings. The edema overall improved. Due to bilateral leg pain, I proceeded with an ABI which was abnormal at 0.78 on the right side and 0.87 on the left side. Duplex ultrasound showed severe stenosis in the right distal SFA and mild diffuse disease in the left SFA.  The patient has significant back discomfort related to spinal stenosis with significant numbness. It was difficult to differentiate that from claudication but she clearly has recurrent cramps in the right calf with walking and at night which forces her to wake up multiple times. Claudication improved with cilostazol. She is trying to quit smoking with Chantix. She has been doing reasonably well overall.    No Known Allergies   Current Outpatient Prescriptions on File Prior to Visit  Medication Sig Dispense Refill  . ALPRAZolam (XANAX) 0.5 MG tablet Take 0.5 mg by mouth at bedtime as needed for sleep.      . Aspirin-Caffeine 1000-65 MG PACK Take by mouth as needed.      . carbamazepine (TEGRETOL) 100 MG chewable tablet ONE TABLET TWICE A DAY  60 tablet  1  . cholecalciferol (VITAMIN D) 1000 UNITS tablet Take 1,000 Units  by mouth 2 (two) times a week.       . cilostazol (PLETAL) 50 MG tablet TAKE 1 TABLET (50 MG TOTAL) BY MOUTH 2 (TWO) TIMES DAILY.  60 tablet  5  . Cyanocobalamin 1000 MCG/ML KIT Inject 1,000 mcg as directed every 7 (seven) days.       . cyclobenzaprine (FLEXERIL) 10 MG tablet Take 10 mg by mouth daily.      . Magnesium Oxide 250 MG TABS Take 250 mg by mouth daily.      . meloxicam (MOBIC) 7.5 MG tablet Take 7.5 mg by mouth 2 (two) times daily.      Marland Kitchen oxyCODONE-acetaminophen (PERCOCET/ROXICET) 5-325 MG per tablet Take 1 tablet by mouth every 6 (six) hours as needed.  30 tablet  0  . thyroid (ARMOUR) 60 MG tablet Take 60 mg by mouth daily before breakfast. Two tablets daily      . varenicline (CHANTIX) 1 MG tablet Take 1 tablet (1 mg total) by mouth 2 (two) times daily.  60 tablet  3   No current facility-administered medications on file prior to visit.     Past Medical History  Diagnosis Date  . Fibromyalgia     Chronic fatigue  . GERD (gastroesophageal reflux disease)   . History of headache   . Strain, cervical   . History of low back pain   . Hypothyroidism   . Lyme disease   . Headache(784.0)   . Hypertension   . Hyperlipidemia   . Thrombosis   . Cancer     uterine  .  Arrhythmia     afib  . Tobacco use   . PVD (peripheral vascular disease)      Past Surgical History  Procedure Laterality Date  . Tonsillectomy    . Tendon repair Right     Hand  . Abdominal hysterectomy    . Tubal ligation Bilateral   . Ovary surgery    . Vulvectomy partial    . Lumbar spine surgery    . Ulnar nerve transposition Left   . Vagina surgery      biopsy     Family History  Problem Relation Age of Onset  . Uterine cancer Mother   . Leukemia Mother   . Congestive Heart Failure Mother   . COPD Mother   . Diabetes Father   . Congestive Heart Failure Father   . Heart disease Maternal Grandfather   . Heart disease Paternal Grandfather   . Arthritis Brother   . Neuropathy Brother       History   Social History  . Marital Status: Married    Spouse Name: Shanon Brow    Number of Children: 2  . Years of Education: 14   Occupational History  . Unemployed     Disability   Social History Main Topics  . Smoking status: Current Every Day Smoker -- 0.50 packs/day for 45 years    Types: Cigarettes  . Smokeless tobacco: Never Used  . Alcohol Use: Yes     Comment: Consumes alcohol on occasion  . Drug Use: No  . Sexual Activity: Not on file   Other Topics Concern  . Not on file   Social History Narrative   Patient is married for 52 years and lives with her husband Shanon Brow)   Retired    Scientist, physiological- college   Right handed.   Caffeine- sodas three daily.       PHYSICAL EXAM   BP 114/68  Pulse 86  Ht 5' 2.5" (1.588 m)  Wt 147 lb (66.679 kg)  BMI 26.44 kg/m2 Constitutional: She is oriented to person, place, and time. She appears well-developed and well-nourished. No distress.  HENT: No nasal discharge.  Head: Normocephalic and atraumatic.  Eyes: Pupils are equal and round. Right eye exhibits no discharge. Left eye exhibits no discharge.  Neck: Normal range of motion. Neck supple. No JVD present. No thyromegaly present. No carotid bruits. Cardiovascular: Normal rate, regular rhythm, normal heart sounds. Exam reveals no gallop and no friction rub. No murmur heard.  Pulmonary/Chest: Effort normal and breath sounds normal. No stridor. No respiratory distress. She has no wheezes. She has no rales. She exhibits no tenderness.  Abdominal: Soft. Bowel sounds are normal. She exhibits no distension. There is no tenderness. There is no rebound and no guarding.  Musculoskeletal: Normal range of motion. She exhibits trace edema and no tenderness.  Neurological: She is alert and oriented to person, place, and time. Coordination normal.  Skin: Skin is warm and dry. No rash noted. She is not diaphoretic. No erythema. No pallor.  Psychiatric: She has a normal mood and affect.  Her behavior is normal. Judgment and thought content normal.  Distal pulses are not palpable on the right side. Dorsalis pedis is +1 on the left side. Femoral pulses are normal  APO:LIDCV  Rhythm  -Prominent R(V1) -nonspecific.   BORDERLINE  ASSESSMENT AND PLAN

## 2014-05-09 NOTE — Assessment & Plan Note (Signed)
She is trying to quit smoking but continues to be under significant stress.

## 2014-05-09 NOTE — Assessment & Plan Note (Signed)
A lot of her symptoms seem to be related to spinal stenosis. However, she clearly has claudication affecting the right calf more than the left calf with evidence of severe right SFA disease. Symptoms improved significantly with the addition of cilostazol which will be continued. She complains of significant cramping mostly at night which is likely related to known history of myositis. She is not on a statin for that reason.

## 2014-05-09 NOTE — Assessment & Plan Note (Signed)
This is due to chronic venous insufficiency. Continue to use low pressure support stockings as tolerated and leg elevation.

## 2014-05-12 ENCOUNTER — Ambulatory Visit: Payer: Self-pay

## 2014-05-12 DIAGNOSIS — Z1231 Encounter for screening mammogram for malignant neoplasm of breast: Secondary | ICD-10-CM | POA: Diagnosis not present

## 2014-05-13 ENCOUNTER — Telehealth: Payer: Self-pay | Admitting: Neurology

## 2014-05-13 ENCOUNTER — Other Ambulatory Visit: Payer: Self-pay | Admitting: Neurology

## 2014-05-13 MED ORDER — OXYCODONE-ACETAMINOPHEN 5-325 MG PO TABS: 1.0000 | ORAL_TABLET | Freq: Four times a day (QID) | ORAL | Status: DC | PRN

## 2014-05-13 NOTE — Telephone Encounter (Signed)
Pt Rx was mailed out today, per Jessica,CPhT.

## 2014-05-13 NOTE — Telephone Encounter (Signed)
Patient requesting refill for oxyCODONE-acetaminophen (PERCOCET/ROXICET) 5-325 MG per tablet.

## 2014-06-03 DIAGNOSIS — D071 Carcinoma in situ of vulva: Secondary | ICD-10-CM | POA: Diagnosis not present

## 2014-06-19 DIAGNOSIS — D071 Carcinoma in situ of vulva: Secondary | ICD-10-CM | POA: Diagnosis not present

## 2014-06-19 DIAGNOSIS — N900 Mild vulvar dysplasia: Secondary | ICD-10-CM | POA: Diagnosis not present

## 2014-06-25 ENCOUNTER — Telehealth: Payer: Self-pay | Admitting: Neurology

## 2014-06-25 ENCOUNTER — Ambulatory Visit: Payer: BC Managed Care – PPO | Admitting: Neurology

## 2014-06-25 NOTE — Telephone Encounter (Signed)
The patient canceled her appointment within 24 hours of the appointment date.

## 2014-07-29 ENCOUNTER — Encounter: Payer: Self-pay | Admitting: Neurology

## 2014-07-29 ENCOUNTER — Ambulatory Visit (INDEPENDENT_AMBULATORY_CARE_PROVIDER_SITE_OTHER): Payer: Medicare Other | Admitting: Neurology

## 2014-07-29 VITALS — BP 122/77 | HR 116 | Wt 151.0 lb

## 2014-07-29 DIAGNOSIS — IMO0001 Reserved for inherently not codable concepts without codable children: Secondary | ICD-10-CM

## 2014-07-29 MED ORDER — HYDROCODONE-ACETAMINOPHEN 5-325 MG PO TABS: 1.0000 | ORAL_TABLET | Freq: Four times a day (QID) | ORAL | Status: DC | PRN

## 2014-07-29 NOTE — Progress Notes (Signed)
Reason for visit: Fibromyalgia  Kelli Morris is an 63 y.o. female  History of present illness:  Kelli Morris is a 63 year old right-handed white female with a history of fibromyalgia. The patient has been under a lot of stress since the death of her father. The patient has had recent issues with peripheral vascular disease, and she has just now started to get into some exercise. The patient is not sleeping well at night. She indicates that she was given hydrocodone for pain following a surgical procedure, and this has worked better than [employer], as it causes less nausea. The patient still is smoking, even after a trial on Chantix. The patient returns for an evaluation.  Past Medical History  Diagnosis Date  . Fibromyalgia     Chronic fatigue  . GERD (gastroesophageal reflux disease)   . History of headache   . Strain, cervical   . History of low back pain   . Hypothyroidism   . Lyme disease   . Headache(784.0)   . Hypertension   . Hyperlipidemia   . Thrombosis   . Cancer     uterine  . Arrhythmia     afib  . Tobacco use   . PVD (peripheral vascular disease)     Past Surgical History  Procedure Laterality Date  . Tonsillectomy    . Tendon repair Right     Hand  . Abdominal hysterectomy    . Tubal ligation Bilateral   . Ovary surgery    . Vulvectomy partial    . Lumbar spine surgery    . Ulnar nerve transposition Left   . Vagina surgery      biopsy    Family History  Problem Relation Age of Onset  . Uterine cancer Mother   . Leukemia Mother   . Congestive Heart Failure Mother   . COPD Mother   . Diabetes Father   . Congestive Heart Failure Father   . Heart disease Maternal Grandfather   . Heart disease Paternal Grandfather   . Arthritis Brother   . Neuropathy Brother     Social history:  reports that she has been smoking Cigarettes.  She has a 22.5 pack-year smoking history. She has never used smokeless tobacco. She reports that she drinks alcohol. She  reports that she does not use illicit drugs.   No Known Allergies  Medications:  Current Outpatient Prescriptions on File Prior to Visit  Medication Sig Dispense Refill  . ALPRAZolam (XANAX) 0.5 MG tablet Take 0.5 mg by mouth at bedtime as needed for sleep.      . carbamazepine (TEGRETOL) 100 MG chewable tablet ONE TABLET TWICE A DAY  60 tablet  1  . cholecalciferol (VITAMIN D) 1000 UNITS tablet Take 1,000 Units by mouth 2 (two) times a week.       . cilostazol (PLETAL) 50 MG tablet TAKE 1 TABLET (50 MG TOTAL) BY MOUTH 2 (TWO) TIMES DAILY.  60 tablet  5  . Cyanocobalamin 1000 MCG/ML KIT Inject 1,000 mcg as directed every 7 (seven) days.       . cyclobenzaprine (FLEXERIL) 10 MG tablet Take 10 mg by mouth daily.      Marland Kitchen estradiol (ESTRING) 2 MG vaginal ring Place 2 mg vaginally every 3 (three) months. follow package directions      . Furosemide (LASIX PO) Take 10 mg by mouth as needed.      . Magnesium Oxide 250 MG TABS Take 250 mg by mouth daily.      Marland Kitchen  meloxicam (MOBIC) 7.5 MG tablet Take 7.5 mg by mouth 2 (two) times daily.      Marland Kitchen thyroid (ARMOUR) 60 MG tablet Take 60 mg by mouth daily before breakfast. Two tablets daily      . varenicline (CHANTIX) 1 MG tablet Take 1 tablet (1 mg total) by mouth 2 (two) times daily.  60 tablet  3   No current facility-administered medications on file prior to visit.    ROS:  Out of a complete 14 system review of symptoms, the patient complains only of the following symptoms, and all other reviewed systems are negative.  Fatigue Cough Leg swelling Constipation Frequent waking Joint pain, joint swelling, back pain, achy muscles, muscle cramps, neck pain Moles, itching Bruising easily Dizziness, headache, numbness, weakness Anxiety  Blood pressure 122/77, pulse 116, weight 151 lb (68.493 kg).  Physical Exam  General: The patient is alert and cooperative at the time of the examination.  Skin: 1-2+ edema is noted in the ankles  bilaterally.   Neurologic Exam  Mental status: The patient is oriented x 3.  Cranial nerves: Facial symmetry is present. Speech is normal, no aphasia or dysarthria is noted. Extraocular movements are full. Visual fields are full.  Motor: The patient has good strength in all 4 extremities.  Sensory examination: Soft touch sensation is symmetric on the face, somewhat tingling on the left hand and left foot.  Coordination: The patient has good finger-nose-finger and heel-to-shin bilaterally.  Gait and station: The patient has a normal gait. Tandem gait is normal. Romberg is negative. No drift is seen.  Reflexes: Deep tendon reflexes are symmetric.   Assessment/Plan:  One. Fibromyalgia  The patient continues to have some discomfort. She is to get back into an exercise program on a regular basis. I have once again encouraged her to stop smoking completely. She will be taken off of Percocet, and switched to hydrocodone getting 30 tablets monthly. A prescription was written today. She will followup in about 6 months.  Jill Alexanders MD 07/29/2014 7:57 PM  [facility] [address] [place], [place] [address]  Phone [phone] Fax [phone]

## 2014-07-29 NOTE — Patient Instructions (Signed)
Fibromyalgia Fibromyalgia is a disorder that is often misunderstood. It is associated with muscular pains and tenderness that comes and goes. It is often associated with fatigue and sleep disturbances. Though it tends to be long-lasting, fibromyalgia is not life-threatening. CAUSES  The exact cause of fibromyalgia is unknown. People with certain gene types are predisposed to developing fibromyalgia and other conditions. Certain factors can play a role as triggers, such as:  Spine disorders.  Arthritis.  Severe injury (trauma) and other physical stressors.  Emotional stressors. SYMPTOMS   The main symptom is pain and stiffness in the muscles and joints, which can vary over time.  Sleep and fatigue problems. Other related symptoms may include:  Bowel and bladder problems.  Headaches.  Visual problems.  Problems with odors and noises.  Depression or mood changes.  Painful periods (dysmenorrhea).  Dryness of the skin or eyes. DIAGNOSIS  There are no specific tests for diagnosing fibromyalgia. Patients can be diagnosed accurately from the specific symptoms they have. The diagnosis is made by determining that nothing else is causing the problems. TREATMENT  There is no cure. Management includes medicines and an active, healthy lifestyle. The goal is to enhance physical fitness, decrease pain, and improve sleep. HOME CARE INSTRUCTIONS   Only take over-the-counter or prescription medicines as directed by your caregiver. Sleeping pills, tranquilizers, and pain medicines may make your problems worse.  Low-impact aerobic exercise is very important and advised for treatment. At first, it may seem to make pain worse. Gradually increasing your tolerance will overcome this feeling.  Learning relaxation techniques and how to control stress will help you. Biofeedback, visual imagery, hypnosis, muscle relaxation, yoga, and meditation are all options.  Anti-inflammatory medicines and  physical therapy may provide short-term help.  Acupuncture or massage treatments may help.  Take muscle relaxant medicines as suggested by your caregiver.  Avoid stressful situations.  Plan a healthy lifestyle. This includes your diet, sleep, rest, exercise, and friends.  Find and practice a hobby you enjoy.  Join a fibromyalgia support group for interaction, ideas, and sharing advice. This may be helpful. SEEK MEDICAL CARE IF:  You are not having good results or improvement from your treatment. FOR MORE INFORMATION  National Fibromyalgia Association: www.fmaware.org Arthritis Foundation: www.arthritis.org Document Released: 11/21/2005 Document Revised: 02/13/2012 Document Reviewed: 03/03/2010 ExitCare Patient Information 2015 [employer]. This information is not intended to replace advice given to you by your health care provider. Make sure you discuss any questions you have with your health care provider.  

## 2014-10-13 DIAGNOSIS — H43813 Vitreous degeneration, bilateral: Secondary | ICD-10-CM | POA: Diagnosis not present

## 2014-10-22 ENCOUNTER — Encounter: Payer: Self-pay | Admitting: Neurology

## 2014-10-28 ENCOUNTER — Encounter: Payer: Self-pay | Admitting: Neurology

## 2014-11-20 ENCOUNTER — Other Ambulatory Visit: Payer: Self-pay | Admitting: Neurology

## 2014-11-20 MED ORDER — HYDROCODONE-ACETAMINOPHEN 5-325 MG PO TABS: 1.0000 | ORAL_TABLET | Freq: Four times a day (QID) | ORAL | Status: DC | PRN

## 2014-11-20 NOTE — Telephone Encounter (Signed)
Patient requesting Rx refill for HYDROcodone-acetaminophen (NORCO) 5-325 MG per tablet.  Please call when ready for pick up.

## 2014-11-20 NOTE — Telephone Encounter (Signed)
Dr Jannifer Franklin is out of the office, request entered, forwarded to [facility] for approval.

## 2014-11-21 NOTE — Telephone Encounter (Signed)
I called the patient to let them know their Rx for Hydrocodone was ready for pickup. Patient was instructed to bring Photo ID.

## 2014-11-25 DIAGNOSIS — R799 Abnormal finding of blood chemistry, unspecified: Secondary | ICD-10-CM | POA: Diagnosis not present

## 2014-11-25 DIAGNOSIS — E039 Hypothyroidism, unspecified: Secondary | ICD-10-CM | POA: Diagnosis not present

## 2014-11-25 DIAGNOSIS — D510 Vitamin B12 deficiency anemia due to intrinsic factor deficiency: Secondary | ICD-10-CM | POA: Diagnosis not present

## 2014-11-25 DIAGNOSIS — E28310 Symptomatic premature menopause: Secondary | ICD-10-CM | POA: Diagnosis not present

## 2014-11-25 DIAGNOSIS — E569 Vitamin deficiency, unspecified: Secondary | ICD-10-CM | POA: Diagnosis not present

## 2014-12-25 ENCOUNTER — Encounter: Payer: Self-pay | Admitting: Cardiovascular Disease

## 2014-12-25 ENCOUNTER — Ambulatory Visit (INDEPENDENT_AMBULATORY_CARE_PROVIDER_SITE_OTHER): Payer: Medicare PPO | Admitting: Cardiovascular Disease

## 2014-12-25 VITALS — BP 110/76 | HR 99 | Ht 63.5 in | Wt 159.8 lb

## 2014-12-25 DIAGNOSIS — R0602 Shortness of breath: Secondary | ICD-10-CM

## 2014-12-25 DIAGNOSIS — Z720 Tobacco use: Secondary | ICD-10-CM

## 2014-12-25 DIAGNOSIS — I739 Peripheral vascular disease, unspecified: Secondary | ICD-10-CM

## 2014-12-25 NOTE — Assessment & Plan Note (Signed)
A lot of her symptoms seem to be related to spinal stenosis. Bilateral calf claudication is stable and worse on the right side. Symptoms improved significantly with the addition of cilostazol which will be continued. She is not on a statin due to known history of myositis.

## 2014-12-25 NOTE — Assessment & Plan Note (Signed)
We again had a prolonged discussion about smoking cessation. She is trying to quit smoking and has cut down significantly.

## 2014-12-25 NOTE — Patient Instructions (Signed)
Continue same medications.   Your physician wants you to follow-up in: 6 months.  You will receive a reminder letter in the mail two months in advance. If you don't receive a letter, please call our office to schedule the follow-up appointment.  

## 2014-12-25 NOTE — Progress Notes (Signed)
Referring provider: Carroll Kinds at [facility] in [place].  HPI  This is a 64 year old female who is here today for a followup visit regarding chronic venous insufficiency and peripheral arterial disease.  She has known history of fibromyalgia and chronic fatigue and sees Dr. Jannifer Franklin from neurology for this. She has prolonged history of tobacco use and family history of coronary artery disease. She reports having Lyme disease which required prolonged treatment with antibiotics.  Nuclear stress test in 06/2013 showed no evidence of ischemia. Echocardiogram was also unremarkable with normal LV systolic and only grade 1 diastolic dysfunction with no significant pulmonary hypertension. for lower extremity edema,  ABI  was abnormal at 0.78 on the right side and 0.87 on the left side. Duplex ultrasound showed severe stenosis in the right distal SFA and mild diffuse disease in the left SFA.  The patient has significant back discomfort related to spinal stenosis with significant numbness. She has been treated medically for peripheral arterial disease. Claudication improved with cilostazol. She is still trying to quit smoking with Chantix. She has been doing reasonably well overall.    No Known Allergies   Current Outpatient Prescriptions on File Prior to Visit  Medication Sig Dispense Refill  . ALPRAZolam (XANAX) 0.5 MG tablet Take 0.5 mg by mouth at bedtime as needed for sleep.    . Aspirin-Caffeine (BC FAST PAIN RELIEF ARTHRITIS) 1000-65 MG PACK Take 1,000 mg by mouth 4 (four) times daily.    . carbamazepine (TEGRETOL) 100 MG chewable tablet ONE TABLET TWICE A DAY 60 tablet 1  . cholecalciferol (VITAMIN D) 1000 UNITS tablet Take 1,000 Units by mouth 2 (two) times a week.     . cilostazol (PLETAL) 50 MG tablet TAKE 1 TABLET (50 MG TOTAL) BY MOUTH 2 (TWO) TIMES DAILY. 60 tablet 5  . Cyanocobalamin 1000 MCG/ML KIT Inject 1,000 mcg as directed every 7 (seven) days.     .  cyclobenzaprine (FLEXERIL) 10 MG tablet Take 10 mg by mouth daily.    Marland Kitchen DHEA 25 MG CAPS Take 25 mg by mouth daily.    Marland Kitchen estradiol (ESTRING) 2 MG vaginal ring Place 2 mg vaginally every 3 (three) months. follow package directions    . Furosemide (LASIX PO) Take 10 mg by mouth as needed.    Marland Kitchen HYDROcodone-acetaminophen (NORCO) 5-325 MG per tablet Take 1 tablet by mouth every 6 (six) hours as needed. 30 tablet 0  . Magnesium Oxide 250 MG TABS Take 250 mg by mouth daily.    . meloxicam (MOBIC) 7.5 MG tablet Take 7.5 mg by mouth 2 (two) times daily.    Marland Kitchen Specialty Vitamins Products (MAGNESIUM, AMINO ACID CHELATE,) 133 MG tablet Take 4 tablets by mouth daily.    Marland Kitchen thyroid (ARMOUR) 60 MG tablet Take 120 mg by mouth 2 (two) times daily. Two tablets daily    . varenicline (CHANTIX) 1 MG tablet Take 1 tablet (1 mg total) by mouth 2 (two) times daily. 60 tablet 3  . Vitamin D, Ergocalciferol, (DRISDOL) 50000 UNITS CAPS capsule Take 50,000 Units by mouth once a week.     No current facility-administered medications on file prior to visit.     Past Medical History  Diagnosis Date  . Fibromyalgia     Chronic fatigue  . GERD (gastroesophageal reflux disease)   . History of headache   . Strain, cervical   . History of low back pain   . Hypothyroidism   . Lyme disease   .  Headache(784.0)   . Hypertension   . Hyperlipidemia   . Thrombosis   . Cancer     uterine  . Arrhythmia     afib  . Tobacco use   . PVD (peripheral vascular disease)      Past Surgical History  Procedure Laterality Date  . Tonsillectomy    . Tendon repair Right     Hand  . Abdominal hysterectomy    . Tubal ligation Bilateral   . Ovary surgery    . Vulvectomy partial    . Lumbar spine surgery    . Ulnar nerve transposition Left   . Vagina surgery      biopsy     Family History  Problem Relation Age of Onset  . Uterine cancer Mother   . Leukemia Mother   . Congestive Heart Failure Mother   . COPD Mother   .  Diabetes Father   . Congestive Heart Failure Father   . Heart disease Maternal Grandfather   . Heart disease Paternal Grandfather   . Arthritis Brother   . Neuropathy Brother      History   Social History  . Marital Status: Married    Spouse Name: Shanon Brow    Number of Children: 2  . Years of Education: 14   Occupational History  . Unemployed     Disability   Social History Main Topics  . Smoking status: Current Every Day Smoker -- 0.50 packs/day for 45 years    Types: Cigarettes  . Smokeless tobacco: Never Used  . Alcohol Use: Yes     Comment: Consumes alcohol on occasion  . Drug Use: No  . Sexual Activity: Not on file   Other Topics Concern  . Not on file   Social History Narrative   Patient is married for 73 years and lives with her husband Shanon Brow)   Retired    Scientist, physiological- college   Right handed.   Caffeine- sodas three daily.       PHYSICAL EXAM   BP 110/76 mmHg  Pulse 99  Ht 5' 3.5" (1.613 m)  Wt 159 lb 12 oz (72.462 kg)  BMI 27.85 kg/m2 Constitutional: She is oriented to person, place, and time. She appears well-developed and well-nourished. No distress.  HENT: No nasal discharge.  Head: Normocephalic and atraumatic.  Eyes: Pupils are equal and round. Right eye exhibits no discharge. Left eye exhibits no discharge.  Neck: Normal range of motion. Neck supple. No JVD present. No thyromegaly present. No carotid bruits. Cardiovascular: Normal rate, regular rhythm, normal heart sounds. Exam reveals no gallop and no friction rub. No murmur heard.  Pulmonary/Chest: Effort normal and breath sounds normal. No stridor. No respiratory distress. She has no wheezes. She has no rales. She exhibits no tenderness.  Abdominal: Soft. Bowel sounds are normal. She exhibits no distension. There is no tenderness. There is no rebound and no guarding.  Musculoskeletal: Normal range of motion. She exhibits trace edema and no tenderness.  Neurological: She is alert and oriented  to person, place, and time. Coordination normal.  Skin: Skin is warm and dry. No rash noted. She is not diaphoretic. No erythema. No pallor.  Psychiatric: She has a normal mood and affect. Her behavior is normal. Judgment and thought content normal.  Distal pulses are not palpable on the right side. Dorsalis pedis is +1 on the left side. Femoral pulses are normal  [id number]  Rhythm  -Prominent R(V1) -nonspecific.   BORDERLINE  ASSESSMENT AND PLAN

## 2014-12-26 ENCOUNTER — Ambulatory Visit: Payer: 59 | Admitting: Cardiovascular Disease

## 2015-01-29 ENCOUNTER — Encounter: Payer: Self-pay | Admitting: Adult Health

## 2015-01-29 ENCOUNTER — Ambulatory Visit (INDEPENDENT_AMBULATORY_CARE_PROVIDER_SITE_OTHER): Payer: Medicare PPO | Admitting: Adult Health

## 2015-01-29 DIAGNOSIS — IMO0001 Reserved for inherently not codable concepts without codable children: Secondary | ICD-10-CM

## 2015-01-29 DIAGNOSIS — M609 Myositis, unspecified: Secondary | ICD-10-CM

## 2015-01-29 DIAGNOSIS — M791 Myalgia: Secondary | ICD-10-CM

## 2015-01-29 MED ORDER — OXYCODONE-ACETAMINOPHEN 5-325 MG PO TABS: 1.0000 | ORAL_TABLET | Freq: Four times a day (QID) | ORAL | Status: DC | PRN

## 2015-01-29 NOTE — Patient Instructions (Signed)
Percocet was reordered.  If your symptoms worsen or you develop new symptoms please let us know.

## 2015-01-29 NOTE — Progress Notes (Signed)
I have read the note, and I agree with the clinical assessment and plan.  WILLIS,CHARLES KEITH   

## 2015-01-29 NOTE — Progress Notes (Signed)
PATIENT: Kelli Morris DOB: 1951-04-29  REASON FOR VISIT: follow up- fibromyalgia HISTORY FROM: patient  HISTORY OF PRESENT ILLNESS: Kelli Morris is a 64 year old female with a history of fibromyalgia. She returns today for follow-up. The patient is currently taking hydrocodone for her pain. She reports that she feels that her pain was better controlled on percocet. She thought the hydrocodone would be better after she had surgical procedure. She states that she can only take 1/2 tablet daily because it causes her to be nausea. 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 The patient is still smoking. She has cut back but has been unable to quit.   HISTORY 07/29/14: Kelli Morris is a 65 year old right-handed white female with a history of fibromyalgia. The patient has been under a lot of stress since the death of her father. The patient has had recent issues with peripheral vascular disease, and she has just now started to get into some exercise. The patient is not sleeping well at night. She indicates that she was given hydrocodone for pain following a surgical procedure, and this has worked better than [employer], as it causes less nausea. The patient still is smoking, even after a trial on Chantix. The patient returns for an evaluation.  REVIEW OF SYSTEMS: Out of a complete 14 system review of symptoms, the patient complains only of the following symptoms, and all other reviewed systems are negative.  Fatigue, fever, blurred vision, leg swelling, constipation, insomnia, frequent waking, joint pian, joint swelling, back pain, aching muscles, muscle cramps, walking difficulty, dizziness, headache, weakness  ALLERGIES: No Known Allergies  HOME MEDICATIONS: Outpatient Prescriptions Prior to Visit  Medication Sig Dispense Refill  . ALPRAZolam (XANAX) 0.5 MG tablet Take 0.5 mg by mouth at bedtime as needed for sleep.    .  Aspirin-Caffeine (BC FAST PAIN RELIEF ARTHRITIS) 1000-65 MG PACK Take 1,000 mg by mouth 4 (four) times daily.    . carbamazepine (TEGRETOL) 100 MG chewable tablet ONE TABLET TWICE A DAY 60 tablet 1  . cholecalciferol (VITAMIN D) 1000 UNITS tablet Take 1,000 Units by mouth 2 (two) times a week.     . cilostazol (PLETAL) 50 MG tablet TAKE 1 TABLET (50 MG TOTAL) BY MOUTH 2 (TWO) TIMES DAILY. 60 tablet 5  . Cyanocobalamin 1000 MCG/ML KIT Inject 1,000 mcg as directed every 7 (seven) days.     . cyclobenzaprine (FLEXERIL) 10 MG tablet Take 10 mg by mouth daily.    Marland Kitchen DHEA 25 MG CAPS Take 25 mg by mouth daily.    Marland Kitchen estradiol (ESTRING) 2 MG vaginal ring 2 mg by Implant route every 3 (three) months. follow package directions    . Furosemide (LASIX PO) Take 10 mg by mouth as needed.    Marland Kitchen HYDROcodone-acetaminophen (NORCO) 5-325 MG per tablet Take 1 tablet by mouth every 6 (six) hours as needed. 30 tablet 0  . Magnesium Oxide 250 MG TABS Take 250 mg by mouth daily.    . meloxicam (MOBIC) 7.5 MG tablet Take 7.5 mg by mouth 2 (two) times daily.    Marland Kitchen Specialty Vitamins Products (MAGNESIUM, AMINO ACID CHELATE,) 133 MG tablet Take 4 tablets by mouth daily.    Marland Kitchen thyroid (ARMOUR) 60 MG tablet Take 120 mg by mouth 2 (two) times daily. Two tablets daily    . Vitamin D, Ergocalciferol, (DRISDOL) 50000 UNITS CAPS capsule Take 50,000 Units by  mouth once a week.    . varenicline (CHANTIX) 1 MG tablet Take 1 tablet (1 mg total) by mouth 2 (two) times daily. (Patient not taking: Reported on 01/29/2015) 60 tablet 3   No facility-administered medications prior to visit.    PAST MEDICAL HISTORY: Past Medical History  Diagnosis Date  . Fibromyalgia     Chronic fatigue  . GERD (gastroesophageal reflux disease)   . History of headache   . Strain, cervical   . History of low back pain   . Hypothyroidism   . Lyme disease   . Headache(784.0)   . Hypertension   . Hyperlipidemia   . Thrombosis   . Cancer     uterine  .  Arrhythmia     afib  . Tobacco use   . PVD (peripheral vascular disease)     PAST SURGICAL HISTORY: Past Surgical History  Procedure Laterality Date  . Tonsillectomy    . Tendon repair Right     Hand  . Abdominal hysterectomy    . Tubal ligation Bilateral   . Ovary surgery    . Vulvectomy partial    . Lumbar spine surgery    . Ulnar nerve transposition Left   . Vagina surgery      biopsy    FAMILY HISTORY: Family History  Problem Relation Age of Onset  . Uterine cancer Mother   . Leukemia Mother   . Congestive Heart Failure Mother   . COPD Mother   . Diabetes Father   . Congestive Heart Failure Father   . Heart disease Maternal Grandfather   . Heart disease Paternal Grandfather   . Arthritis Brother   . Neuropathy Brother     PHYSICAL EXAM  Filed Vitals:   01/29/15 1459  BP: 125/80  Pulse: 90  Height: _0  (1.626 m)  Weight: 159 lb (72.122 kg)   Body mass index is 27.28 kg/(m^2).  Generalized: Well developed, in no acute distress   Neurological examination  Mentation: Alert oriented to time, place, history taking. Follows all commands speech and language fluent Cranial nerve II-XII: Pupils were equal round reactive to light. Extraocular movements were full, visual field were full on confrontational test. Facial sensation and strength were normal. Uvula tongue midline. Head turning and shoulder shrug  were normal and symmetric. Motor: The motor testing reveals 5 over 5 strength of all 4 extremities. Good symmetric motor tone is noted throughout.  Sensory: Sensory testing is intact to soft touch on all 4 extremities. No evidence of extinction is noted.  Coordination: Cerebellar testing reveals good finger-nose-finger and heel-to-shin bilaterally.  Gait and station: Gait is normal. Tandem gait is unsteady. Romberg is negative. No drift is seen.  Reflexes: Deep tendon reflexes are symmetric and normal bilaterally.  Marland Kitchen   DIAGNOSTIC DATA (LABS, IMAGING,  TESTING) - I reviewed patient records, labs, notes, testing and imaging myself where available.     ASSESSMENT AND PLAN 64 y.o. year old female  has a past medical history of Fibromyalgia; GERD (gastroesophageal reflux disease); History of headache; Strain, cervical; History of low back pain; Hypothyroidism; Lyme disease; Headache(784.0); Hypertension; Hyperlipidemia; Thrombosis; Cancer; Arrhythmia; Tobacco use; and PVD (peripheral vascular disease). here with:  1.  Fibromyalgia   Patient feels that the percocet was more helpful for her pain. She will be switched from hydrocodone to Percocet. The patient has signed a pain contract with [place] in the past. Overall the patient states that she is doing well. She will let us know  if her symptoms worsen or she develops new symptoms. She will follow-up in 6 months or sooner if needed.     Ward Givens, MSN, NP-C 01/29/2015, 3:21 PM [facility] [address] [place], [place] [address] [phone]  Note: This document was prepared with digital dictation and possible smart phrase technology. Any transcriptional errors that result from this process are unintentional.

## 2015-04-22 ENCOUNTER — Other Ambulatory Visit: Payer: Self-pay | Admitting: Neurology

## 2015-05-01 ENCOUNTER — Telehealth: Payer: Self-pay

## 2015-05-01 MED ORDER — CARBAMAZEPINE 100 MG PO CHEW
CHEWABLE_TABLET | ORAL | Status: DC
Start: 2015-05-01 — End: 2015-10-01

## 2015-05-01 NOTE — Telephone Encounter (Signed)
Patient states she is having patient in her legs, and feels may be myalgia related.  She used to take Carbamazepine 100mg  one twice daily, but has not filled this Rx in over 1 year and would like to know if she should restart this med, or if something different recommended.  Indicated this drug has helped in the past.  If okay to restart, she would like Rx sent to [facility].  Please advise.  Thank you.

## 2015-05-01 NOTE — Telephone Encounter (Signed)
I called patient. She is having recurence of lancinating pains involving the legs. The carbamazepine has helped in the past, she has run out of the prescription. I will call in another prescription for her.

## 2015-05-21 ENCOUNTER — Telehealth: Payer: Self-pay

## 2015-05-21 ENCOUNTER — Other Ambulatory Visit: Payer: Self-pay | Admitting: Neurology

## 2015-05-21 MED ORDER — OXYCODONE-ACETAMINOPHEN 5-325 MG PO TABS: 1.0000 | ORAL_TABLET | Freq: Four times a day (QID) | ORAL | Status: DC | PRN

## 2015-05-21 NOTE — Telephone Encounter (Signed)
Rx ready for pick up. 

## 2015-05-21 NOTE — Telephone Encounter (Signed)
Patient called requested a refill on Rx. oxyCODONE-acetaminophen (PERCOCET/ROXICET) 5-325 MG per tablet.

## 2015-05-21 NOTE — Telephone Encounter (Signed)
Request entered, forwarded to provider for approval.  

## 2015-07-30 ENCOUNTER — Ambulatory Visit (INDEPENDENT_AMBULATORY_CARE_PROVIDER_SITE_OTHER): Payer: Medicare PPO | Admitting: Adult Health

## 2015-07-30 ENCOUNTER — Encounter: Payer: Self-pay | Admitting: Adult Health

## 2015-07-30 ENCOUNTER — Ambulatory Visit: Payer: Medicare PPO | Admitting: Neurology

## 2015-07-30 VITALS — BP 120/76 | HR 67 | Ht 64.0 in | Wt 161.0 lb

## 2015-07-30 DIAGNOSIS — M797 Fibromyalgia: Secondary | ICD-10-CM | POA: Diagnosis not present

## 2015-07-30 MED ORDER — OXYCODONE-ACETAMINOPHEN 5-325 MG PO TABS: 1.0000 | ORAL_TABLET | Freq: Four times a day (QID) | ORAL | Status: DC | PRN

## 2015-07-30 NOTE — Progress Notes (Signed)
PATIENT: Kelli Morris DOB: November 27, 1951  REASON FOR VISIT: follow up- fibromyalgia HISTORY FROM: patient  HISTORY OF PRESENT ILLNESS: Kelli Morris is a 64 year old female with a history of fibromyalgia. She returns today for follow-up. The patient continues to take Percocet for severe pain. She states that she only takes half a tablet at bedtime if needed. She states higher doses of narcotic medication causes her to feel nauseous. She states otherwise she takes aspirin. The patient was having sharp stabbing pains in the legs that were intermittent. She was started on carbamazepine. She states this has been beneficial. She states the pains are not that often now. However she is only taken 1 dose of the carbamazepine. She states that she forgets to take the nighttime dose. Patient states that she does have leg cramps at night occasionally. She states that she normally can take a spoonful of mustard and this will resolve the cramps. She denies any new neurological symptoms. She returns today for an evaluation.  HISTORY 01/29/15: Kelli Morris is a 64 year old female with a history of fibromyalgia. She returns today for follow-up. The patient is currently taking hydrocodone for her pain. She reports that she feels that her pain was better controlled on percocet. She thought the hydrocodone would be better after she had surgical procedure. She states that she can only take 1/2 tablet daily because it causes her to be nausea. 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 The patient is still smoking. She has cut back but has been unable to quit.   HISTORY 07/29/14: Kelli Morris is a 64 year old right-handed white female with a history of fibromyalgia. The patient has been under a lot of stress since the death of her father. The patient has had recent issues with peripheral vascular disease, and she has just now started to get into some exercise.  The patient is not sleeping well at night. She indicates that she was given hydrocodone for pain following a surgical procedure, and this has worked better than [employer], as it causes less nausea. The patient still is smoking, even after a trial on Chantix. The patient returns for an evaluation.  REVIEW OF SYSTEMS: Out of a complete 14 system review of symptoms, the patient complains only of the following symptoms, and all other reviewed systems are negative.  Joint pain, joint swelling, back pain, aching muscles, muscle cramps, insomnia, frequent waking, dizziness, headache, bruise/bleed easily  ALLERGIES: No Known Allergies  HOME MEDICATIONS: Outpatient Prescriptions Prior to Visit  Medication Sig Dispense Refill  . ALPRAZolam (XANAX) 0.5 MG tablet Take 0.5 mg by mouth at bedtime as needed for sleep.    . Aspirin-Caffeine (BC FAST PAIN RELIEF ARTHRITIS) 1000-65 MG PACK Take 1,000 mg by mouth 4 (four) times daily.    . carbamazepine (TEGRETOL) 100 MG chewable tablet ONE TABLET TWICE A DAY 60 tablet 3  . cholecalciferol (VITAMIN D) 1000 UNITS tablet Take 1,000 Units by mouth 2 (two) times a week.     . cilostazol (PLETAL) 50 MG tablet TAKE 1 TABLET (50 MG TOTAL) BY MOUTH 2 (TWO) TIMES DAILY. 60 tablet 5  . Cyanocobalamin 1000 MCG/ML KIT Inject 1,000 mcg as directed every 7 (seven) days.     . cyclobenzaprine (FLEXERIL) 10 MG tablet Take 10 mg by mouth daily.    Marland Kitchen DHEA 25 MG CAPS Take 25 mg by mouth daily.    Marland Kitchen  Furosemide (LASIX PO) Take 10 mg by mouth as needed.    . Magnesium Oxide 250 MG TABS Take 250 mg by mouth daily.    . meloxicam (MOBIC) 7.5 MG tablet Take 7.5 mg by mouth 2 (two) times daily.    Marland Kitchen oxyCODONE-acetaminophen (PERCOCET/ROXICET) 5-325 MG per tablet Take 1 tablet by mouth every 6 (six) hours as needed. 30 tablet 0  . Specialty Vitamins Products (MAGNESIUM, AMINO ACID CHELATE,) 133 MG tablet Take 4 tablets by mouth daily.    Marland Kitchen thyroid (ARMOUR) 60 MG tablet Take 120 mg by  mouth 2 (two) times daily. Two tablets daily    . Vitamin D, Ergocalciferol, (DRISDOL) 50000 UNITS CAPS capsule Take 50,000 Units by mouth once a week.    . estradiol (ESTRING) 2 MG vaginal ring 2 mg by Implant route every 3 (three) months. follow package directions    . varenicline (CHANTIX) 1 MG tablet Take 1 tablet (1 mg total) by mouth 2 (two) times daily. (Patient not taking: Reported on 07/30/2015) 60 tablet 3   No facility-administered medications prior to visit.    PAST MEDICAL HISTORY: Past Medical History  Diagnosis Date  . Fibromyalgia     Chronic fatigue  . GERD (gastroesophageal reflux disease)   . History of headache   . Strain, cervical   . History of low back pain   . Hypothyroidism   . Lyme disease   . Headache(784.0)   . Hypertension   . Hyperlipidemia   . Thrombosis   . Cancer     uterine  . Arrhythmia     afib  . Tobacco use   . PVD (peripheral vascular disease)     PAST SURGICAL HISTORY: Past Surgical History  Procedure Laterality Date  . Tonsillectomy    . Tendon repair Right     Hand  . Abdominal hysterectomy    . Tubal ligation Bilateral   . Ovary surgery    . Vulvectomy partial    . Lumbar spine surgery    . Ulnar nerve transposition Left   . Vagina surgery      biopsy    FAMILY HISTORY: Family History  Problem Relation Age of Onset  . Uterine cancer Mother   . Leukemia Mother   . Congestive Heart Failure Mother   . COPD Mother   . Diabetes Father   . Congestive Heart Failure Father   . Heart disease Maternal Grandfather   . Heart disease Paternal Grandfather   . Arthritis Brother   . Neuropathy Brother     SOCIAL HISTORY: Social History   Social History  . Marital Status: Married    Spouse Name: Shanon Brow  . Number of Children: 2  . Years of Education: 14   Occupational History  . Unemployed     Disability   Social History Main Topics  . Smoking status: Current Every Day Smoker -- 0.50 packs/day for 45 years    Types:  Cigarettes  . Smokeless tobacco: Never Used  . Alcohol Use: Yes     Comment: Consumes alcohol on occasion  . Drug Use: No  . Sexual Activity: Not on file   Other Topics Concern  . Not on file   Social History Narrative   Patient is married for 64 years and lives with her husband Shanon Brow)   Retired    Scientist, physiological- college   Right handed.   Caffeine- sodas three daily.      PHYSICAL EXAM  Filed Vitals:   07/30/15 0725  BP: 120/76  Pulse: 67  Height: '5\' 4"'  (1.626 m)  Weight: 161 lb (73.029 kg)   Body mass index is 27.62 kg/(m^2).  Generalized: Well developed, in no acute distress   Neurological examination  Mentation: Alert oriented to time, place, history taking. Follows all commands speech and language fluent Cranial nerve II-XII: Pupils were equal round reactive to light. Extraocular movements were full, visual field were full on confrontational test. Facial sensation and strength were normal. Uvula tongue midline. Head turning and shoulder shrug  were normal and symmetric. Motor: The motor testing reveals 5 over 5 strength of all 4 extremities. Good symmetric motor tone is noted throughout.  Sensory: Sensory testing is intact to soft touch on all 4 extremities. No evidence of extinction is noted.  Coordination: Cerebellar testing reveals good finger-nose-finger and heel-to-shin bilaterally.  Gait and station: Gait is normal. Tandem gait is normal. Romberg is negative. No drift is seen.  Reflexes: Deep tendon reflexes are symmetric and normal bilaterally.   DIAGNOSTIC DATA (LABS, IMAGING, TESTING) - I reviewed patient records, labs, notes, testing and imaging myself where available.    ASSESSMENT AND PLAN 64 y.o. year old female  has a past medical history of Fibromyalgia; GERD (gastroesophageal reflux disease); History of headache; Strain, cervical; History of low back pain; Hypothyroidism; Lyme disease; Headache(784.0); Hypertension; Hyperlipidemia; Thrombosis; Cancer;  Arrhythmia; Tobacco use; and PVD (peripheral vascular disease). here with:  1. Fibromyalgia  Overall the patient is doing well. The pain in her legs have improved with carbamazepine. I have encouraged the patient to take carbamazepine twice a day she may find that this further improves her discomfort. Patient verbalized understanding. Patient will continue taking Percocet as needed for severe pain. I will refill this today. Patient advised that if her symptoms worsen or she develops new symptoms she should let us know. She will follow-up in 6 months or sooner if needed.     Ward Givens, MSN, NP-C 07/30/2015, 7:36 AM [facility] [address] [place], [place] [address] [phone]

## 2015-07-30 NOTE — Patient Instructions (Signed)
Continue Carbamazepine- try taking twice a day as prescribed I will refill percocet today. If your symptoms worsen or you develop new symptoms please let us know.

## 2015-07-30 NOTE — Progress Notes (Signed)
I have read the note, and I agree with the clinical assessment and plan.  Kelli Morris KEITH   

## 2015-07-31 ENCOUNTER — Other Ambulatory Visit: Payer: Self-pay

## 2015-07-31 ENCOUNTER — Encounter: Payer: Self-pay | Admitting: Cardiovascular Disease

## 2015-07-31 ENCOUNTER — Ambulatory Visit (INDEPENDENT_AMBULATORY_CARE_PROVIDER_SITE_OTHER): Payer: Medicare PPO | Admitting: Cardiovascular Disease

## 2015-07-31 ENCOUNTER — Telehealth: Payer: Self-pay

## 2015-07-31 ENCOUNTER — Telehealth: Payer: Self-pay | Admitting: *Deleted

## 2015-07-31 ENCOUNTER — Other Ambulatory Visit
Admission: RE | Admit: 2015-07-31 | Discharge: 2015-07-31 | Disposition: A | Discharge status: Home / Self Care | Payer: Medicare PPO | Source: Ambulatory Visit | Attending: Cardiovascular Disease | Admitting: Cardiovascular Disease

## 2015-07-31 ENCOUNTER — Ambulatory Visit (INDEPENDENT_AMBULATORY_CARE_PROVIDER_SITE_OTHER): Payer: Medicare PPO

## 2015-07-31 VITALS — BP 112/60 | HR 69 | Ht 63.0 in | Wt 159.8 lb

## 2015-07-31 DIAGNOSIS — R0602 Shortness of breath: Secondary | ICD-10-CM

## 2015-07-31 DIAGNOSIS — I739 Peripheral vascular disease, unspecified: Secondary | ICD-10-CM

## 2015-07-31 DIAGNOSIS — Z720 Tobacco use: Secondary | ICD-10-CM

## 2015-07-31 DIAGNOSIS — M7989 Other specified soft tissue disorders: Secondary | ICD-10-CM | POA: Insufficient documentation

## 2015-07-31 LAB — BASIC METABOLIC PANEL
ANION GAP: 7 (ref 5–15)
BUN: 10 mg/dL (ref 6–20)
CHLORIDE: 106 mmol/L (ref 101–111)
CO2: 25 mmol/L (ref 22–32)
Calcium: 8.9 mg/dL (ref 8.9–10.3)
Creatinine, Ser: 0.98 mg/dL (ref 0.44–1.00)
GFR calc non Af Amer: 60 mL/min — ABNORMAL LOW (ref >60)
Glucose, Bld: 135 mg/dL — ABNORMAL HIGH (ref 65–99)
POTASSIUM: 3.9 mmol/L (ref 3.5–5.1)
SODIUM: 138 mmol/L (ref 135–145)

## 2015-07-31 LAB — CBC
HEMATOCRIT: 40.3 % (ref 35.0–47.0)
HEMOGLOBIN: 13.3 g/dL (ref 12.0–16.0)
MCH: 31 pg (ref 26.0–34.0)
MCHC: 33.1 g/dL (ref 32.0–36.0)
MCV: 93.8 fL (ref 80.0–100.0)
Platelets: 269 10*3/uL (ref 150–440)
RBC: 4.29 MIL/uL (ref 3.80–5.20)
RDW: 14.5 % (ref 11.5–14.5)
WBC: 7.7 10*3/uL (ref 3.6–11.0)

## 2015-07-31 LAB — FIBRIN DERIVATIVES D-DIMER (ARMC ONLY): Fibrin derivatives D-dimer (ARMC): 523 — ABNORMAL HIGH (ref 0–499)

## 2015-07-31 MED ORDER — RIVAROXABAN 15 MG PO TABS: 15.0000 mg | ORAL_TABLET | Freq: Two times a day (BID) | ORAL | Status: DC

## 2015-07-31 MED ORDER — CILOSTAZOL 100 MG PO TABS: 100.0000 mg | ORAL_TABLET | Freq: Two times a day (BID) | ORAL | Status: DC

## 2015-07-31 MED ORDER — RIVAROXABAN 20 MG PO TABS: 20.0000 mg | ORAL_TABLET | Freq: Every day | ORAL | Status: DC

## 2015-07-31 NOTE — Patient Instructions (Signed)
Medication Instructions:  Your physician has recommended you make the following change in your medication:  INCREASE pletal to 100mg  twice per day   Labwork: none  Testing/Procedures: Your physician has requested that you have a lower or upper extremity venous duplex. This test is an ultrasound of the veins in the legs or arms. It looks at venous blood flow that carries blood from the heart to the legs or arms. Allow one hour for a Lower Venous exam. Allow thirty minutes for an Upper Venous exam. There are no restrictions or special instructions.     Follow-Up: Your physician wants you to follow-up in: six months with Dr. Fletcher Anon.  You will receive a reminder letter in the mail two months in advance. If you don't receive a letter, please call our office to schedule the follow-up appointment.   Any Other Special Instructions Will Be Listed Below (If Applicable).

## 2015-07-31 NOTE — Telephone Encounter (Signed)
Per verbal from Dr. Fletcher Anon: "Tell her there is a DVT in one of the small veins. Start Xarelto 15mg  BID for 21 days then 20mg  daily. She will likely require treatment for 3 months. Follow up in one month.   S/w pt regarding results and Dr. Tyrell Antonio recommendations.  Will leave Xarelto samples at front. Pt states she will pick them up today and make 1 month f/u appt at that time. Pt verbalized understanding of our conversation with no further questions.

## 2015-07-31 NOTE — Assessment & Plan Note (Signed)
Given her long trip to [place], I think we should evaluate for DVT. Thus, I requested venous duplex.

## 2015-07-31 NOTE — Assessment & Plan Note (Signed)
A lot of her symptoms seem to be related to spinal stenosis. Bilateral calf claudication is stable and worse on the right side. Symptoms improved significantly with the addition of cilostazol which will be continued. She is not on a statin due to known history of myositis but I asked her to bring me a copy of her lipid profile to see if treatment is needed. Usually I have a low threshold to start treatment in patients with PAD. Claudication is currently not lifestyle limiting. I increased the dose of cilostazol total 100 mg twice daily to improve her symptoms. I asked her to continue with her walking program.

## 2015-07-31 NOTE — Assessment & Plan Note (Signed)
I again discussed with her the importance of smoking cessation but she has not decided about quitting at this time.

## 2015-07-31 NOTE — Telephone Encounter (Signed)
Pt is not sure if she is to keep taking pletal since we put her on Xearelto  Please advise.

## 2015-07-31 NOTE — Progress Notes (Signed)
Referring provider: Carroll Kinds at [facility] in [place].  HPI  This is a 64 year old female who is here today for a followup visit regarding chronic venous insufficiency and peripheral arterial disease.  She has known history of fibromyalgia and chronic fatigue and sees Dr. Jannifer Franklin from neurology for this. She has prolonged history of tobacco use and family history of coronary artery disease. She reports having Lyme disease which required prolonged treatment with antibiotics.  Nuclear stress test in 06/2013 showed no evidence of ischemia. Echocardiogram was also unremarkable with normal LV systolic and only grade 1 diastolic dysfunction with no significant pulmonary hypertension. for lower extremity edema,  ABI  was abnormal at 0.78 on the right side and 0.87 on the left side. Duplex ultrasound showed severe stenosis in the right distal SFA and mild diffuse disease in the left SFA.  The patient has significant back discomfort related to spinal stenosis with significant numbness. She has been treated medically for peripheral arterial disease. Claudication improved with cilostazol.  She has not been able to quit smoking in spite of using Chantix. She has been trying to be more active and usually walks 30 minutes 3 times a week. She gets mild right calf discomfort with walking which is usually not bad enough to make her stop. She is able to walk through it. She went to [place] in July and did a lot of walking there. She fell after she missed a step and had discomfort in her right leg after that. Since that trip, she has experienced swelling in her right leg with some discomfort.   No Known Allergies   Current Outpatient Prescriptions on File Prior to Visit  Medication Sig Dispense Refill  . ALPRAZolam (XANAX) 0.5 MG tablet Take 0.5 mg by mouth at bedtime as needed for sleep.    . Aspirin-Caffeine (BC FAST PAIN RELIEF ARTHRITIS) 1000-65 MG PACK Take 1,000 mg by mouth 4 (four) times  daily.    . carbamazepine (TEGRETOL) 100 MG chewable tablet ONE TABLET TWICE A DAY 60 tablet 3  . cholecalciferol (VITAMIN D) 1000 UNITS tablet Take 1,000 Units by mouth 2 (two) times a week.     . cilostazol (PLETAL) 50 MG tablet TAKE 1 TABLET (50 MG TOTAL) BY MOUTH 2 (TWO) TIMES DAILY. 60 tablet 5  . Cyanocobalamin 1000 MCG/ML KIT Inject 1,000 mcg as directed every 7 (seven) days.     . cyclobenzaprine (FLEXERIL) 10 MG tablet Take 10 mg by mouth daily.    Marland Kitchen DHEA 25 MG CAPS Take 25 mg by mouth daily.    . Furosemide (LASIX PO) Take 10 mg by mouth as needed.    . Magnesium Oxide 250 MG TABS Take 250 mg by mouth daily.    . meloxicam (MOBIC) 7.5 MG tablet Take 7.5 mg by mouth 2 (two) times daily.    Marland Kitchen oxyCODONE-acetaminophen (PERCOCET/ROXICET) 5-325 MG per tablet Take 1 tablet by mouth every 6 (six) hours as needed. 30 tablet 0  . Specialty Vitamins Products (MAGNESIUM, AMINO ACID CHELATE,) 133 MG tablet Take 4 tablets by mouth daily.    Marland Kitchen thyroid (ARMOUR) 60 MG tablet Take 120 mg by mouth 2 (two) times daily. Two tablets daily    . Vitamin D, Ergocalciferol, (DRISDOL) 50000 UNITS CAPS capsule Take 50,000 Units by mouth once a week.     No current facility-administered medications on file prior to visit.     Past Medical History  Diagnosis Date  . Fibromyalgia  Chronic fatigue  . GERD (gastroesophageal reflux disease)   . History of headache   . Strain, cervical   . History of low back pain   . Hypothyroidism   . Lyme disease   . Headache(784.0)   . Hypertension   . Hyperlipidemia   . Thrombosis   . Cancer     uterine  . Arrhythmia     afib  . Tobacco use   . PVD (peripheral vascular disease)      Past Surgical History  Procedure Laterality Date  . Tonsillectomy    . Tendon repair Right     Hand  . Abdominal hysterectomy    . Tubal ligation Bilateral   . Ovary surgery    . Vulvectomy partial    . Lumbar spine surgery    . Ulnar nerve transposition Left   .  Vagina surgery      biopsy     Family History  Problem Relation Age of Onset  . Uterine cancer Mother   . Leukemia Mother   . Congestive Heart Failure Mother   . COPD Mother   . Diabetes Father   . Congestive Heart Failure Father   . Heart disease Maternal Grandfather   . Heart disease Paternal Grandfather   . Arthritis Brother   . Neuropathy Brother      Social History   Social History  . Marital Status: Married    Spouse Name: Shanon Brow  . Number of Children: 2  . Years of Education: 14   Occupational History  . Unemployed     Disability   Social History Main Topics  . Smoking status: Current Every Day Smoker -- 0.50 packs/day for 45 years    Types: Cigarettes  . Smokeless tobacco: Never Used  . Alcohol Use: Yes     Comment: Consumes alcohol on occasion  . Drug Use: No  . Sexual Activity: Not on file   Other Topics Concern  . Not on file   Social History Narrative   Patient is married for 54 years and lives with her husband Shanon Brow)   Retired    Scientist, physiological- college   Right handed.   Caffeine- sodas three daily.       PHYSICAL EXAM   BP 112/60 mmHg  Ht '5\' 3"'  (1.6 m)  Wt 159 lb 12 oz (72.462 kg)  BMI 28.31 kg/m2 Constitutional: She is oriented to person, place, and time. She appears well-developed and well-nourished. No distress.  HENT: No nasal discharge.  Head: Normocephalic and atraumatic.  Eyes: Pupils are equal and round. Right eye exhibits no discharge. Left eye exhibits no discharge.  Neck: Normal range of motion. Neck supple. No JVD present. No thyromegaly present. No carotid bruits. Cardiovascular: Normal rate, regular rhythm, normal heart sounds. Exam reveals no gallop and no friction rub. No murmur heard.  Pulmonary/Chest: Effort normal and breath sounds normal. No stridor. No respiratory distress. She has no wheezes. She has no rales. She exhibits no tenderness.  Abdominal: Soft. Bowel sounds are normal. She exhibits no distension. There is  no tenderness. There is no rebound and no guarding.  Musculoskeletal: Normal range of motion. She exhibits trace edema and no tenderness.  Neurological: She is alert and oriented to person, place, and time. Coordination normal.  Skin: Skin is warm and dry. No rash noted. She is not diaphoretic. No erythema. No pallor.  Psychiatric: She has a normal mood and affect. Her behavior is normal. Judgment and thought content normal.  Distal pulses  are not palpable on the right side. Dorsalis pedis is +1 on the left side. Femoral pulses are normal  EPN:TBHGR  Rhythm  -Prominent R(V1) -nonspecific.   BORDERLINE  ASSESSMENT AND PLAN

## 2015-07-31 NOTE — Telephone Encounter (Signed)
S/w pt regarding her concern of taking pletal and xaralto Indicated to pt that Dr. Fletcher Anon aware she is taking pletal but will forward message to him. States she will start xarelto and wait to hear back from [place]. Forward to [place].

## 2015-08-03 NOTE — Telephone Encounter (Signed)
If she is taking aspirin then I definitely recommend that we hold Pletal until we are done with anticoagulation. Otherwise the risk of bleeding is high.

## 2015-08-03 NOTE — Telephone Encounter (Signed)
S/w pt regarding Dr. Tyrell Antonio recommendations. States she takes [facility] Arthritis 4 times a day for pain and can not discontinue it as her pain will be too great. Takes percocet 1/2 tablet as needed when [facility] isn't enough however, percocet makes her sick. States she knows [facility] is not good for her but says "it is no worse than the oxycodone"  Asking what Dr. Fletcher Anon would recommend for pain. States that she will probably continue pletal. Reviewed signs of bleeding and what to monitor. Will forward to [facility] for pain suggestions.

## 2015-08-03 NOTE — Telephone Encounter (Signed)
She is on a small dose Pletal. First make sure she is not taking an aspirin or NSAIDs. In terms of Pletal, we have 2 options: Either discontinue while she is on anticoagulation to decrease the risk of bleeding. Claudication might worsen as a result. The second option is to keep her on both. The risk of bleeding is slightly higher.

## 2015-08-04 NOTE — Telephone Encounter (Signed)
Left detailed VM message on home and cell phone with CB number.

## 2015-08-04 NOTE — Telephone Encounter (Signed)
S/w pt regarding Dr. Tyrell Antonio recommendations. States she will continue [facility] and discontinue pletal as she states narcotics make her sick. Confirmed October 7 appt. Pt verbalized understanding with no further questions.

## 2015-08-07 ENCOUNTER — Telehealth: Payer: Self-pay

## 2015-08-07 NOTE — Telephone Encounter (Signed)
S/w pt who states she is leaving today to go out town today. States trip is only 1 1/2 hours. Concerned about new swelling behind her right knee. Would like to know if it is ok to travel.  Pt taking Eliquis 15mg  BID for DVT in right leg.  Forward to [place] to advise on travel.

## 2015-08-07 NOTE — Telephone Encounter (Signed)
Per verbal from Dr. Fletcher Anon, pt ok to travel.  S/w pt regarding Dr. Tyrell Antonio reply. Pt verbalized understanding and states she will stop during trip and get out to walk. Pt had no other questions.

## 2015-08-07 NOTE — Telephone Encounter (Signed)
Pt states her right leg is swollen behind her knee. States she has a blood clot, is going out of town this afternoon, and wanted to make sure it is ok to travel. Please call and advise

## 2015-08-31 DIAGNOSIS — E559 Vitamin D deficiency, unspecified: Secondary | ICD-10-CM | POA: Diagnosis not present

## 2015-08-31 DIAGNOSIS — D510 Vitamin B12 deficiency anemia due to intrinsic factor deficiency: Secondary | ICD-10-CM | POA: Diagnosis not present

## 2015-08-31 DIAGNOSIS — R5382 Chronic fatigue, unspecified: Secondary | ICD-10-CM | POA: Diagnosis not present

## 2015-08-31 DIAGNOSIS — E039 Hypothyroidism, unspecified: Secondary | ICD-10-CM | POA: Diagnosis not present

## 2015-08-31 DIAGNOSIS — E569 Vitamin deficiency, unspecified: Secondary | ICD-10-CM | POA: Diagnosis not present

## 2015-08-31 DIAGNOSIS — R799 Abnormal finding of blood chemistry, unspecified: Secondary | ICD-10-CM | POA: Diagnosis not present

## 2015-08-31 DIAGNOSIS — E28310 Symptomatic premature menopause: Secondary | ICD-10-CM | POA: Diagnosis not present

## 2015-09-11 ENCOUNTER — Encounter: Payer: Self-pay | Admitting: Cardiovascular Disease

## 2015-09-11 ENCOUNTER — Ambulatory Visit (INDEPENDENT_AMBULATORY_CARE_PROVIDER_SITE_OTHER): Payer: Medicare PPO | Admitting: Cardiovascular Disease

## 2015-09-11 VITALS — BP 110/70 | HR 85 | Ht 63.0 in | Wt 160.8 lb

## 2015-09-11 DIAGNOSIS — I739 Peripheral vascular disease, unspecified: Secondary | ICD-10-CM | POA: Diagnosis not present

## 2015-09-11 DIAGNOSIS — Z720 Tobacco use: Secondary | ICD-10-CM | POA: Diagnosis not present

## 2015-09-11 DIAGNOSIS — I82491 Acute embolism and thrombosis of other specified deep vein of right lower extremity: Secondary | ICD-10-CM | POA: Diagnosis not present

## 2015-09-11 DIAGNOSIS — I82401 Acute embolism and thrombosis of unspecified deep veins of right lower extremity: Secondary | ICD-10-CM | POA: Insufficient documentation

## 2015-09-11 MED ORDER — VARENICLINE TARTRATE 0.5 MG X 11 & 1 MG X 42 PO MISC: ORAL | Status: DC

## 2015-09-11 MED ORDER — RIVAROXABAN 20 MG PO TABS: 20.0000 mg | ORAL_TABLET | Freq: Every day | ORAL | Status: DC

## 2015-09-11 NOTE — Assessment & Plan Note (Signed)
I again discussed with her the importance of smoking cessation. She wants to try Chantix again which was prescribed today.

## 2015-09-11 NOTE — Progress Notes (Signed)
Referring provider: Carroll Kinds at [facility] in [place].  HPI  This is a 64 year old female who is here today for a followup visit regarding chronic venous insufficiency and peripheral arterial disease.  She has known history of fibromyalgia and chronic fatigue and sees Dr. Jannifer Franklin from neurology for this. She has prolonged history of tobacco use and family history of coronary artery disease. She reports having Lyme disease which required prolonged treatment with antibiotics.  Nuclear stress test in 06/2013 showed no evidence of ischemia. Echocardiogram was also unremarkable with normal LV systolic and only grade 1 diastolic dysfunction with no significant pulmonary hypertension. for lower extremity edema,  ABI  was abnormal at 0.78 on the right side and 0.87 on the left side. Duplex ultrasound showed severe stenosis in the right distal SFA and mild diffuse disease in the left SFA.  The patient has significant back discomfort related to spinal stenosis with significant numbness. She has been treated medically for peripheral arterial disease. Claudication improved with cilostazol.  She was seen recently for right calf swelling after a trip to [place] with injury related to a fall to that area.  Venous duplex showed possible small thrombus in the peroneal vein. Thus, she was started on anticoagulation with Xarelto. She reports improvement in swelling. Claudication is stable. She continues to smoke one pack per day and wants to try to quit smoking again.    No Known Allergies   Current Outpatient Prescriptions on File Prior to Visit  Medication Sig Dispense Refill  . ALPRAZolam (XANAX) 0.5 MG tablet Take 0.5 mg by mouth at bedtime as needed for sleep.    . Aspirin-Caffeine (BC FAST PAIN RELIEF ARTHRITIS) 1000-65 MG PACK Take 1,000 mg by mouth 4 (four) times daily.    . carbamazepine (TEGRETOL) 100 MG chewable tablet ONE TABLET TWICE A DAY 60 tablet 3  . cholecalciferol (VITAMIN  D) 1000 UNITS tablet Take 1,000 Units by mouth 2 (two) times a week.     . Cyanocobalamin 1000 MCG/ML KIT Inject 1,000 mcg as directed every 7 (seven) days.     . cyclobenzaprine (FLEXERIL) 10 MG tablet Take 10 mg by mouth daily.    Marland Kitchen DHEA 25 MG CAPS Take 25 mg by mouth daily.    . Furosemide (LASIX PO) Take 10 mg by mouth as needed.    . Magnesium Oxide 250 MG TABS Take 250 mg by mouth daily.    . meloxicam (MOBIC) 7.5 MG tablet Take 7.5 mg by mouth 2 (two) times daily.    Marland Kitchen oxyCODONE-acetaminophen (PERCOCET/ROXICET) 5-325 MG per tablet Take 1 tablet by mouth every 6 (six) hours as needed. 30 tablet 0  . Specialty Vitamins Products (MAGNESIUM, AMINO ACID CHELATE,) 133 MG tablet Take 4 tablets by mouth daily.    Marland Kitchen thyroid (ARMOUR) 60 MG tablet Take 120 mg by mouth 2 (two) times daily. Two tablets daily    . Vitamin D, Ergocalciferol, (DRISDOL) 50000 UNITS CAPS capsule Take 50,000 Units by mouth once a week.     No current facility-administered medications on file prior to visit.     Past Medical History  Diagnosis Date  . Fibromyalgia     Chronic fatigue  . GERD (gastroesophageal reflux disease)   . History of headache   . Strain, cervical   . History of low back pain   . Hypothyroidism   . Lyme disease   . Headache(784.0)   . Hypertension   . Hyperlipidemia   . Thrombosis   .  Cancer ([place])     uterine  . Arrhythmia     afib  . Tobacco use   . PVD (peripheral vascular disease) [facility])      Past Surgical History  Procedure Laterality Date  . Tonsillectomy    . Tendon repair Right     Hand  . Abdominal hysterectomy    . Tubal ligation Bilateral   . Ovary surgery    . Vulvectomy partial    . Lumbar spine surgery    . Ulnar nerve transposition Left   . Vagina surgery      biopsy     Family History  Problem Relation Age of Onset  . Uterine cancer Mother   . Leukemia Mother   . Congestive Heart Failure Mother   . COPD Mother   . Diabetes Father   . Congestive  Heart Failure Father   . Heart disease Maternal Grandfather   . Heart disease Paternal Grandfather   . Arthritis Brother   . Neuropathy Brother      Social History   Social History  . Marital Status: Married    Spouse Name: Shanon Brow  . Number of Children: 2  . Years of Education: 14   Occupational History  . Unemployed     Disability   Social History Main Topics  . Smoking status: Current Every Day Smoker -- 0.50 packs/day for 45 years    Types: Cigarettes  . Smokeless tobacco: Never Used  . Alcohol Use: Yes     Comment: Consumes alcohol on occasion  . Drug Use: No  . Sexual Activity: Not on file   Other Topics Concern  . Not on file   Social History Narrative   Patient is married for 74 years and lives with her husband Shanon Brow)   Retired    Scientist, physiological- college   Right handed.   Caffeine- sodas three daily.       PHYSICAL EXAM   BP 110/70 mmHg  Pulse 85  Ht '5\' 3"'  (1.6 m)  Wt 160 lb 12 oz (72.916 kg)  BMI 28.48 kg/m2 Constitutional: She is oriented to person, place, and time. She appears well-developed and well-nourished. No distress.  HENT: No nasal discharge.  Head: Normocephalic and atraumatic.  Eyes: Pupils are equal and round. Right eye exhibits no discharge. Left eye exhibits no discharge.  Neck: Normal range of motion. Neck supple. No JVD present. No thyromegaly present. No carotid bruits. Cardiovascular: Normal rate, regular rhythm, normal heart sounds. Exam reveals no gallop and no friction rub. No murmur heard.  Pulmonary/Chest: Effort normal and breath sounds normal. No stridor. No respiratory distress. She has no wheezes. She has no rales. She exhibits no tenderness.  Abdominal: Soft. Bowel sounds are normal. She exhibits no distension. There is no tenderness. There is no rebound and no guarding.  Musculoskeletal: Normal range of motion. She exhibits trace edema and no tenderness.  Neurological: She is alert and oriented to person, place, and time.  Coordination normal.  Skin: Skin is warm and dry. No rash noted. She is not diaphoretic. No erythema. No pallor.  Psychiatric: She has a normal mood and affect. Her behavior is normal. Judgment and thought content normal.  Distal pulses are not palpable on the right side. Dorsalis pedis is +1 on the left side. Femoral pulses are normal    ASSESSMENT AND PLAN

## 2015-09-11 NOTE — Assessment & Plan Note (Signed)
This was a provoked event and also the thrombus was relatively small in the peroneal vein. Thus, I am planning to treat her with anticoagulation only for 3-4 months. I switched Xarelto to 20 mg once daily.

## 2015-09-11 NOTE — Assessment & Plan Note (Signed)
Cilostazol has been on hold given the addition of anticoagulation. Claudication seems to be stable.

## 2015-09-11 NOTE — Patient Instructions (Signed)
Medication Instructions:  Your physician has recommended you make the following change in your medication:  START taking chantix TAKE xarelto 20mg  once per day   Labwork: none  Testing/Procedures: none  Follow-Up: Your physician recommends that you schedule a follow-up appointment in: three months with Dr. Fletcher Anon   Any Other Special Instructions Will Be Listed Below (If Applicable).

## 2015-10-01 ENCOUNTER — Other Ambulatory Visit: Payer: Self-pay | Admitting: Neurology

## 2015-10-01 MED ORDER — OXYCODONE-ACETAMINOPHEN 5-325 MG PO TABS: 1.0000 | ORAL_TABLET | Freq: Four times a day (QID) | ORAL | Status: DC | PRN

## 2015-10-01 NOTE — Telephone Encounter (Signed)
Request entered, forwarded to provider for approval.  

## 2015-10-01 NOTE — Telephone Encounter (Signed)
Patient called to request refill of oxyCODONE-acetaminophen (PERCOCET/ROXICET) 5-325 MG per tablet

## 2015-10-02 ENCOUNTER — Telehealth: Payer: Self-pay

## 2015-10-02 NOTE — Telephone Encounter (Signed)
Rx ready for pick up. 

## 2015-12-14 ENCOUNTER — Ambulatory Visit: Payer: Medicare PPO | Admitting: Cardiovascular Disease

## 2015-12-15 ENCOUNTER — Encounter: Payer: Self-pay | Admitting: Cardiovascular Disease

## 2015-12-15 ENCOUNTER — Ambulatory Visit (INDEPENDENT_AMBULATORY_CARE_PROVIDER_SITE_OTHER): Payer: Medicare Other | Admitting: Cardiovascular Disease

## 2015-12-15 VITALS — BP 116/70 | HR 82 | Ht 63.0 in | Wt 167.8 lb

## 2015-12-15 DIAGNOSIS — E785 Hyperlipidemia, unspecified: Secondary | ICD-10-CM

## 2015-12-15 DIAGNOSIS — I739 Peripheral vascular disease, unspecified: Secondary | ICD-10-CM | POA: Diagnosis not present

## 2015-12-15 DIAGNOSIS — I82591 Chronic embolism and thrombosis of other specified deep vein of right lower extremity: Secondary | ICD-10-CM

## 2015-12-15 DIAGNOSIS — Z720 Tobacco use: Secondary | ICD-10-CM | POA: Diagnosis not present

## 2015-12-15 NOTE — Assessment & Plan Note (Signed)
She reports no claudication at the present time and thus I elected to continue medical therapy. Continue daily aspirin.

## 2015-12-15 NOTE — Assessment & Plan Note (Signed)
Unfortunately, she was not able to quit with Chantix. She is down to half a pack per day and I again discussed with her the importance of smoking cessation.

## 2015-12-15 NOTE — Patient Instructions (Signed)
Medication Instructions:  Your physician has recommended you make the following change in your medication:  STOP taking xarelto  Labwork: Lipid and liver profile today  Testing/Procedures: none  Follow-Up: Your physician wants you to follow-up in: six months with Dr. Fletcher Anon.  You will receive a reminder letter in the mail two months in advance. If you don't receive a letter, please call our office to schedule the follow-up appointment.   Any Other Special Instructions Will Be Listed Below (If Applicable).     If you need a refill on your cardiac medications before your next appointment, please call your pharmacy.

## 2015-12-15 NOTE — Assessment & Plan Note (Signed)
This was a provoked event and also the thrombus was relatively small in the peroneal vein. Thus, I am going to stop Xarelto today.

## 2015-12-15 NOTE — Assessment & Plan Note (Signed)
I requested fasting lipid and liver profile with a low threshold to start treatment with a statin given that she has underlying peripheral arterial disease.

## 2015-12-15 NOTE — Progress Notes (Signed)
Referring provider: Carroll Kinds at [facility] in [place].  HPI  This is a 65 year old female who is here today for a followup visit regarding chronic venous insufficiency , right lower extremity small DVT and peripheral arterial disease.  She has known history of fibromyalgia and chronic fatigue and sees Dr. Jannifer Franklin from neurology for this. She has prolonged history of tobacco use and family history of coronary artery disease. She reports having Lyme disease which required prolonged treatment with antibiotics.  Nuclear stress test in 06/2013 showed no evidence of ischemia. Echocardiogram was also unremarkable with normal LV systolic and only grade 1 diastolic dysfunction with no significant pulmonary hypertension. for lower extremity edema,  ABI  was abnormal at 0.78 on the right side and 0.87 on the left side. Duplex ultrasound showed severe stenosis in the right distal SFA and mild diffuse disease in the left SFA.  The patient has significant back discomfort related to spinal stenosis with significant numbness. She has been treated medically for peripheral arterial disease.  She was seen in August last year for right calf swelling after a trip to [place] with injury related to a fall to that area.  Venous duplex showed possible small thrombus in the peroneal vein. Thus, she was started on anticoagulation with Xarelto.  She reports no pain or swelling in the right leg. She is not having claudication at the present time. During last visit, I started her on Chantix for smoking cessation which she took for about 2 months but was not able to quit. She cut down to half a pack per day.   No Known Allergies   Current Outpatient Prescriptions on File Prior to Visit  Medication Sig Dispense Refill  . ALPRAZolam (XANAX) 0.5 MG tablet Take 0.5 mg by mouth at bedtime as needed for sleep.    . Aspirin-Caffeine (BC FAST PAIN RELIEF ARTHRITIS) 1000-65 MG PACK Take 1,000 mg by mouth 4 (four)  times daily.    . carbamazepine (TEGRETOL) 100 MG chewable tablet TAKE 1 TABLET TWICE DAILY (Patient taking differently: TAKE 1 TABLET BY MOUTH TWICE DAILY) 60 tablet 3  . cholecalciferol (VITAMIN D) 1000 UNITS tablet Take 1,000 Units by mouth 2 (two) times a week.     . Cyanocobalamin 1000 MCG/ML KIT Inject 1,000 mcg as directed every 7 (seven) days.     Marland Kitchen DHEA 25 MG CAPS Take 25 mg by mouth daily.    . Magnesium Oxide 250 MG TABS Take 250 mg by mouth daily.    Marland Kitchen oxyCODONE-acetaminophen (PERCOCET/ROXICET) 5-325 MG tablet Take 1 tablet by mouth every 6 (six) hours as needed. (Patient taking differently: Take 1 tablet by mouth every 6 (six) hours as needed for moderate pain or severe pain. ) 30 tablet 0  . rivaroxaban (XARELTO) 20 MG TABS tablet Take 1 tablet (20 mg total) by mouth daily with supper. 30 tablet 5  . thyroid (ARMOUR) 60 MG tablet Take 120 mg by mouth 2 (two) times daily. Two tablets daily    . Furosemide (LASIX PO) Take 10 mg by mouth as needed (AS NEEDED FOR FLUID OR SWELLING). Reported on 12/15/2015    . varenicline (CHANTIX STARTING MONTH PAK) 0.5 MG X 11 & 1 MG X 42 tablet Take one 0.5 mg tablet by mouth once daily for 3 days, then increase to one 0.5 mg tablet twice daily for 4 days, then increase to one 1 mg tablet twice daily. (Patient not taking: Reported on 12/15/2015) 53 tablet 0  No current facility-administered medications on file prior to visit.     Past Medical History  Diagnosis Date  . Fibromyalgia     Chronic fatigue  . GERD (gastroesophageal reflux disease)   . History of headache   . Strain, cervical   . History of low back pain   . Hypothyroidism   . Lyme disease   . Headache(784.0)   . Hypertension   . Hyperlipidemia   . Thrombosis   . Cancer ([place])     uterine  . Arrhythmia     afib  . Tobacco use   . PVD (peripheral vascular disease) [facility])      Past Surgical History  Procedure Laterality Date  . Tonsillectomy    . Tendon repair Right      Hand  . Abdominal hysterectomy    . Tubal ligation Bilateral   . Ovary surgery    . Vulvectomy partial    . Lumbar spine surgery    . Ulnar nerve transposition Left   . Vagina surgery      biopsy     Family History  Problem Relation Age of Onset  . Uterine cancer Mother   . Leukemia Mother   . Congestive Heart Failure Mother   . COPD Mother   . Diabetes Father   . Congestive Heart Failure Father   . Heart disease Maternal Grandfather   . Heart disease Paternal Grandfather   . Arthritis Brother   . Neuropathy Brother      Social History   Social History  . Marital Status: Married    Spouse Name: Shanon Brow  . Number of Children: 2  . Years of Education: 14   Occupational History  . Unemployed     Disability   Social History Main Topics  . Smoking status: Current Every Day Smoker -- 0.50 packs/day for 45 years    Types: Cigarettes  . Smokeless tobacco: Never Used  . Alcohol Use: Yes     Comment: Consumes alcohol on occasion  . Drug Use: No  . Sexual Activity: Not on file   Other Topics Concern  . Not on file   Social History Narrative   Patient is married for 69 years and lives with her husband Shanon Brow)   Retired    Scientist, physiological- college   Right handed.   Caffeine- sodas three daily.       PHYSICAL EXAM   BP 116/70 mmHg  Pulse 82  Ht '5\' 3"'  (1.6 m)  Wt 167 lb 12.8 oz (76.114 kg)  BMI 29.73 kg/m2 Constitutional: She is oriented to person, place, and time. She appears well-developed and well-nourished. No distress.  HENT: No nasal discharge.  Head: Normocephalic and atraumatic.  Eyes: Pupils are equal and round. Right eye exhibits no discharge. Left eye exhibits no discharge.  Neck: Normal range of motion. Neck supple. No JVD present. No thyromegaly present. No carotid bruits. Cardiovascular: Normal rate, regular rhythm, normal heart sounds. Exam reveals no gallop and no friction rub. No murmur heard.  Pulmonary/Chest: Effort normal and breath sounds  normal. No stridor. No respiratory distress. She has no wheezes. She has no rales. She exhibits no tenderness.  Abdominal: Soft. Bowel sounds are normal. She exhibits no distension. There is no tenderness. There is no rebound and no guarding.  Musculoskeletal: Normal range of motion. She exhibits trace edema and no tenderness.  Neurological: She is alert and oriented to person, place, and time. Coordination normal.  Skin: Skin is warm and dry. No  rash noted. She is not diaphoretic. No erythema. No pallor.  Psychiatric: She has a normal mood and affect. Her behavior is normal. Judgment and thought content normal.  Distal pulses are not palpable on the right side. Dorsalis pedis is +1 on the left side. Femoral pulses are normal    ASSESSMENT AND PLAN

## 2015-12-16 ENCOUNTER — Other Ambulatory Visit: Payer: Self-pay

## 2015-12-16 DIAGNOSIS — E785 Hyperlipidemia, unspecified: Secondary | ICD-10-CM

## 2015-12-16 LAB — HEPATIC FUNCTION PANEL: BILIRUBIN, DIRECT: 0.05 mg/dL (ref 0.00–0.40)

## 2015-12-16 LAB — LIPID PANEL
Chol/HDL Ratio: 4.7 ratio units — ABNORMAL HIGH (ref 0.0–4.4)
Cholesterol, Total: 207 mg/dL — ABNORMAL HIGH (ref 100–199)
HDL: 44 mg/dL (ref >39)
LDL Calculated: 149 mg/dL — ABNORMAL HIGH (ref 0–99)
TRIGLYCERIDES: 70 mg/dL (ref 0–149)
VLDL CHOLESTEROL CAL: 14 mg/dL (ref 5–40)

## 2015-12-16 MED ORDER — ATORVASTATIN CALCIUM 40 MG PO TABS: 40.0000 mg | ORAL_TABLET | Freq: Every day | ORAL | Status: DC

## 2016-01-14 ENCOUNTER — Ambulatory Visit
Admission: RE | Admit: 2016-01-14 | Discharge: 2016-01-14 | Disposition: A | Discharge status: Home / Self Care | Payer: Medicare Other | Source: Ambulatory Visit | Attending: Family Medicine | Admitting: Family Medicine

## 2016-01-14 ENCOUNTER — Telehealth: Payer: Self-pay | Admitting: *Deleted

## 2016-01-14 ENCOUNTER — Ambulatory Visit (INDEPENDENT_AMBULATORY_CARE_PROVIDER_SITE_OTHER): Payer: Medicare Other | Admitting: Family Medicine

## 2016-01-14 VITALS — BP 124/70 | HR 82 | Temp 98.2°F | Ht 64.0 in | Wt 167.8 lb

## 2016-01-14 DIAGNOSIS — I82591 Chronic embolism and thrombosis of other specified deep vein of right lower extremity: Secondary | ICD-10-CM

## 2016-01-14 DIAGNOSIS — G44229 Chronic tension-type headache, not intractable: Secondary | ICD-10-CM

## 2016-01-14 DIAGNOSIS — Z8701 Personal history of pneumonia (recurrent): Secondary | ICD-10-CM

## 2016-01-14 DIAGNOSIS — F419 Anxiety disorder, unspecified: Secondary | ICD-10-CM

## 2016-01-14 DIAGNOSIS — M7989 Other specified soft tissue disorders: Secondary | ICD-10-CM

## 2016-01-14 NOTE — Telephone Encounter (Signed)
Please let the patient know that her ultrasound did not show any DVT. They were unable to visualize 1 vein in her leg. No evidence of DVT.

## 2016-01-14 NOTE — Progress Notes (Signed)
Patient ID: Kelli Morris, female   DOB: 1951/09/28, 65 y.o.   MRN: [id number]  Tommi Rumps, MD Phone: [phone]  Kelli Morris is a 65 y.o. female who presents today for new patient visit.  Patient reports no acute complaints today. She does note a number of stable chronic issues.  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 Headaches are an achy pain in the back of her head. She has neuropathy in her hands and feet and has evidently lost a nerve in one of her arms. Notes at some point she lost the use of both hands due to weakness. At this point most of her strength is coming back. She does have decreased sensation in her hands. She notes she takes a number of aspirin and BC powders daily for this. 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 She has some imbalance when she walks due to her neuropathy. No lightheadedness. She takes carbamazepine for her neuropathy.  She additionally notes she had pneumonia in January and was treated with Levaquin and codeine cough syrup by the walk-in clinic. She now she is much improved. Minimal cough now. She feels significantly better. She states she was advised that she needed to see pulmonology due to her chest x-ray findings.  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 She came off Xarelto at some point in January. She notes over the last several weeks her right leg has intermittently gotten swollen. Notes it is normal sized at the beginning of the day and increases in size towards the end of the day. Size of her leg decreases after propping it up. She notes no calf pain with this. Sometimes she hurts in the anterior portion of her ankle. She is no longer on Xarelto.  Anxiety: Patient takes Xanax as needed infrequently. She notes she lost both of her parents recently she has been seeing a Social worker for this. She feels that this is helping. No depression.  Active Ambulatory Problems    Diagnosis Date Noted    . Encounter for therapeutic drug monitoring 01/25/2013  . Pain in limb 01/25/2013  . Lumbosacral spondylosis without myelopathy 01/25/2013  . Spinal stenosis, unspecified region other than cervical 01/25/2013  . Meningitis, unspecified(322.9) 01/25/2013  . Chronic fatigue syndrome 01/25/2013  . Myalgia and myositis, unspecified 01/25/2013  . Chest heaviness 07/04/2013  . SOB (shortness of breath) 07/04/2013  . Claudication ([place]) 07/04/2013  . Bilateral leg edema 07/04/2013  . Tobacco use   . Dizziness 11/09/2013  . Right leg swelling 07/31/2015  . PAD (peripheral artery disease) ([place]) 07/31/2015  . Right leg DVT ([place]) 09/11/2015  . Hyperlipidemia 12/15/2015  . Chronic headaches 01/17/2016  . History of pneumonia 01/17/2016  . Anxiety 01/17/2016   Resolved Ambulatory Problems    Diagnosis Date Noted  . No Resolved Ambulatory Problems   Past Medical History  Diagnosis Date  . Fibromyalgia   . GERD (gastroesophageal reflux disease)   . History of headache   . Strain, cervical   . History of low back pain   . Hypothyroidism   . Lyme disease   . Headache(784.0)   . Hypertension   . Thrombosis   . Cancer ([place])   . Arrhythmia   . PVD (peripheral vascular disease) ([facility])     Family History  Problem Relation Age of Onset  . Uterine cancer Mother   . Leukemia Mother   . Congestive Heart Failure Mother   .  COPD Mother   . Diabetes Father   . Congestive Heart Failure Father   . Heart disease Maternal Grandfather   . Heart disease Paternal Grandfather   . Arthritis Brother   . Neuropathy Brother     Social History   Social History  . Marital Status: Married    Spouse Name: Shanon Brow  . Number of Children: 2  . Years of Education: 14   Occupational History  . Unemployed     Disability   Social History Main Topics  . Smoking status: Current Every Day Smoker -- 0.50 packs/day for 45 years    Types: Cigarettes  . Smokeless tobacco: Never Used  . Alcohol Use: Yes      Comment: Consumes alcohol on occasion  . Drug Use: No  . Sexual Activity: Not on file   Other Topics Concern  . Not on file   Social History Narrative   Patient is married for 35 years and lives with her husband Shanon Brow)   Retired    Scientist, physiological- college   Right handed.   Caffeine- sodas three daily.    ROS   General:  Negative for nexplained weight loss, fever Skin: Negative for new or changing mole, sore that won't heal HEENT: Negative for trouble hearing, trouble seeing, ringing in ears, mouth sores, hoarseness, change in voice, dysphagia. CV:  Positive for edema, Negative for chest pain, dyspnea, palpitations Resp: Positive for cough, Negative for dyspnea, hemoptysis GI: Negative for nausea, vomiting, diarrhea, constipation, abdominal pain, melena, hematochezia. GU: Negative for dysuria, incontinence, urinary hesitance, hematuria, vaginal or penile discharge, polyuria, sexual difficulty, lumps in testicle or breasts MSK: Positive for muscle cramps or aches, joint pain or swelling Neuro: Positive for headaches, weakness, numbness, dizziness, negative for passing out/fainting Psych: Positive for anxiety, Negative for depression, memory problems  Objective  Physical Exam Filed Vitals:   01/14/16 0827  BP: 124/70  Pulse: 82  Temp: 98.2 F (36.8 C)    BP Readings from Last 3 Encounters:  01/14/16 124/70  12/15/15 116/70  09/11/15 110/70   Wt Readings from Last 3 Encounters:  01/14/16 167 lb 12.8 oz (76.114 kg)  12/15/15 167 lb 12.8 oz (76.114 kg)  09/11/15 160 lb 12 oz (72.916 kg)    Physical Exam  Constitutional: She is well-developed, well-nourished, and in no distress.  HENT:  Head: Normocephalic and atraumatic.  Right Ear: External ear normal.  Left Ear: External ear normal.  Mouth/Throat: Oropharynx is clear and moist. No oropharyngeal exudate.  Eyes: Conjunctivae are normal. Pupils are equal, round, and reactive to light.  Neck: Neck supple.   Cardiovascular: Normal rate, regular rhythm and normal heart sounds.  Exam reveals no gallop and no friction rub.   No murmur heard. Pulmonary/Chest: Effort normal and breath sounds normal. No respiratory distress. She has no wheezes. She has no rales.  Abdominal: Soft. Bowel sounds are normal. She exhibits no distension. There is no tenderness. There is no rebound and no guarding.  Musculoskeletal:  Right lower extremity minimally larger than left lower extremity, no calf tenderness or cords  Lymphadenopathy:    She has no cervical adenopathy.  Neurological: She is alert.  CN 2-12 intact, 5/5 strength in bilateral biceps, triceps, grip, quads, hamstrings, plantar and dorsiflexion, sensation to light touch decreased in hands and feet, otherwise intact in bilateral UE and LE, normal gait, 2+ patellar reflexes  Skin: Skin is warm and dry. She is not diaphoretic.  Psychiatric: Mood and affect normal.  Assessment/Plan:   Right leg DVT [facility]) Patient with history right leg DVT. Has noted mild increase in swelling that occurs as the day goes on and resolves with propping her legs up since coming off treatment for DVT. No longer on anticoagulation. Suspect most likely venous insufficiency. Doubt DVT given that this improves with propping her legs up and is not associated with pain, tenderness, or cords. We'll obtain ultrasound to rule out DVT. Given return precautions.  Chronic headaches Patient reports tension headaches likely related to arthritis in her neck. These are chronic. They're unchanged. She is neurologically at her baseline. She is followed by neurology for this. She'll continue to follow with neurology. She does report some imbalance with walking related to her neuropathy. Orthostatics were negative. She will continue to monitor the symptoms. Given return precautions.  History of pneumonia Had pneumonia earlier this year. She is much improved at this time. Per her report she was  advised she needed to see a pulmonologist given recent chest x-ray findings. I will request the read of the chest x-ray as patient requests that I look at it prior to her seeing a pulmonologist.  Anxiety Seeing a counselor and taking Xanax for this. She'll continue these things. Given return precautions.    Orders Placed This Encounter  Procedures  . US Venous Img Lower Unilateral Right    Standing Status: Future     Number of Occurrences: 1     Standing Expiration Date: 03/13/2017    Order Specific Question:  Reason for Exam (SYMPTOM  OR DIAGNOSIS REQUIRED)    Answer:  right lower extremity swelling with recent history of DVT off xarelto    Order Specific Question:  Preferred imaging location?    Answer:  [facility]    Order Specific Question:  Call Results- Best Contact Number?    Answer:  [phone]    No orders of the defined types were placed in this encounter.     Tommi Rumps

## 2016-01-14 NOTE — Patient Instructions (Signed)
Nice to meet you. Please continue to monitor your neurological issues and follow-up with neurology. Please contact Dr. Tyrell Antonio office for follow-up on your leg. If you develop increasing leg swelling, leg pain, chest pain, shortness of breath, palpitations, headaches, numbness, weakness, vision changes, anxiety, depression, thoughts of harming herself or others, or any new or changing symptoms please seek medical attention.

## 2016-01-14 NOTE — Progress Notes (Signed)
Pre visit review using our clinic review tool, if applicable. No additional management support is needed unless otherwise documented below in the visit note. 

## 2016-01-14 NOTE — Telephone Encounter (Signed)
Spoke with Joycie at Ultrasound.  Report available in EPIC.  STAT US: No advice of a DVT.   Patient had told staff there that she had a DVT in calf last year.  They attempted to look there also and they were unable to see calf veins.   Patient went home.

## 2016-01-14 NOTE — Telephone Encounter (Signed)
Stat Report, ultrasound  Please call ASAP  [phone] Lauren from Ultrasound

## 2016-01-14 NOTE — Telephone Encounter (Signed)
Notified patient of US results. Patient verbalized understanding.  

## 2016-01-17 DIAGNOSIS — F419 Anxiety disorder, unspecified: Secondary | ICD-10-CM | POA: Insufficient documentation

## 2016-01-17 DIAGNOSIS — Z8701 Personal history of pneumonia (recurrent): Secondary | ICD-10-CM | POA: Insufficient documentation

## 2016-01-17 DIAGNOSIS — R51 Headache: Secondary | ICD-10-CM

## 2016-01-17 DIAGNOSIS — G8929 Other chronic pain: Secondary | ICD-10-CM | POA: Insufficient documentation

## 2016-01-17 NOTE — Assessment & Plan Note (Signed)
Patient reports tension headaches likely related to arthritis in her neck. These are chronic. They're unchanged. She is neurologically at her baseline. She is followed by neurology for this. She'll continue to follow with neurology. She does report some imbalance with walking related to her neuropathy. Orthostatics were negative. She will continue to monitor the symptoms. Given return precautions.

## 2016-01-17 NOTE — Assessment & Plan Note (Signed)
Had pneumonia earlier this year. She is much improved at this time. Per her report she was advised she needed to see a pulmonologist given recent chest x-ray findings. I will request the read of the chest x-ray as patient requests that I look at it prior to her seeing a pulmonologist.

## 2016-01-17 NOTE — Assessment & Plan Note (Signed)
Seeing a counselor and taking Xanax for this. She'll continue these things. Given return precautions.

## 2016-01-17 NOTE — Assessment & Plan Note (Signed)
Patient with history right leg DVT. Has noted mild increase in swelling that occurs as the day goes on and resolves with propping her legs up since coming off treatment for DVT. No longer on anticoagulation. Suspect most likely venous insufficiency. Doubt DVT given that this improves with propping her legs up and is not associated with pain, tenderness, or cords. We'll obtain ultrasound to rule out DVT. Given return precautions.

## 2016-01-18 ENCOUNTER — Telehealth: Payer: Self-pay | Admitting: Surgical

## 2016-01-18 ENCOUNTER — Encounter: Payer: Self-pay | Admitting: Family Medicine

## 2016-01-18 DIAGNOSIS — R9389 Abnormal findings on diagnostic imaging of other specified body structures: Secondary | ICD-10-CM

## 2016-01-18 NOTE — Telephone Encounter (Signed)
Spoke with patient and she would like to have the CT Scan order from the chest xray from [facility]. Can you please place the order.

## 2016-01-18 NOTE — Telephone Encounter (Signed)
Order placed

## 2016-01-21 ENCOUNTER — Encounter: Payer: Self-pay | Admitting: Family Medicine

## 2016-01-26 ENCOUNTER — Ambulatory Visit
Admission: RE | Admit: 2016-01-26 | Discharge: 2016-01-26 | Disposition: A | Discharge status: Home / Self Care | Payer: Medicare Other | Source: Ambulatory Visit | Attending: Family Medicine | Admitting: Family Medicine

## 2016-01-26 ENCOUNTER — Other Ambulatory Visit: Payer: Self-pay | Admitting: Family Medicine

## 2016-01-26 DIAGNOSIS — R9389 Abnormal findings on diagnostic imaging of other specified body structures: Secondary | ICD-10-CM

## 2016-01-26 DIAGNOSIS — J479 Bronchiectasis, uncomplicated: Secondary | ICD-10-CM | POA: Insufficient documentation

## 2016-01-26 DIAGNOSIS — R918 Other nonspecific abnormal finding of lung field: Secondary | ICD-10-CM | POA: Insufficient documentation

## 2016-01-26 DIAGNOSIS — R590 Localized enlarged lymph nodes: Secondary | ICD-10-CM | POA: Insufficient documentation

## 2016-01-26 DIAGNOSIS — I2510 Atherosclerotic heart disease of native coronary artery without angina pectoris: Secondary | ICD-10-CM | POA: Diagnosis not present

## 2016-01-26 DIAGNOSIS — R938 Abnormal findings on diagnostic imaging of other specified body structures: Secondary | ICD-10-CM | POA: Diagnosis not present

## 2016-01-27 ENCOUNTER — Telehealth: Payer: Self-pay | Admitting: Cardiovascular Disease

## 2016-01-27 ENCOUNTER — Telehealth: Payer: Self-pay | Admitting: Family Medicine

## 2016-01-27 ENCOUNTER — Other Ambulatory Visit: Payer: Self-pay | Admitting: *Deleted

## 2016-01-27 DIAGNOSIS — J8410 Pulmonary fibrosis, unspecified: Secondary | ICD-10-CM

## 2016-01-27 NOTE — Telephone Encounter (Signed)
Patient had ct that showed CAD and wants to discuss with nurse.   Patient was not aware that she may have had cad and wants to know if Dr. Fletcher Anon was aware or if she should make him aware.

## 2016-01-27 NOTE — Telephone Encounter (Signed)
Called and spoke with patient regarding CT scan results. Advised that it shows findings of pulmonary fibrosis and that she needs evaluation by pulmonologist. Referral will be placed. Also discussed the findings of three-vessel coronary artery disease. Advised that I would send a message to her cardiologist regarding this to make him aware. Patient is already on statin and taking aspirin.

## 2016-01-27 NOTE — Telephone Encounter (Signed)
Patient states that she had a CT that showed some coronary artery disease and that her doctors office had called with those results. Let her know that Dr. Fletcher Anon reviewed chest CT and stated that he agreed with current treatment since she had a previous normal stress test and no symptoms. Instructed her to call or go to the nearest emergency room if she has any chest pain and associated jaw pain, arm pain, or mid back pain. She verbalized understanding of all instructions and had no further questions. Confirmed that she is coming in 02/04/2016 for some labs to be drawn. Patient also stated that she was recently seen for leg swelling and a ultrasound did not show any evidence of DVT. Her primary physician is seeing her for the leg swelling. Told her to please call if she has any questions or symptoms.

## 2016-01-28 ENCOUNTER — Other Ambulatory Visit: Payer: Medicare Other

## 2016-01-29 ENCOUNTER — Telehealth: Payer: Self-pay | Admitting: *Deleted

## 2016-01-29 DIAGNOSIS — R05 Cough: Secondary | ICD-10-CM

## 2016-01-29 DIAGNOSIS — R059 Cough, unspecified: Secondary | ICD-10-CM

## 2016-01-29 NOTE — Telephone Encounter (Signed)
Patient called regarding recent CT scan and stated that her physician told her that she had coronary artery disease and wanted to make sure that you were aware. She has an upcoming appointment to have labs done on 02/04/2016. Let her know that you were aware of these results.

## 2016-01-29 NOTE — Telephone Encounter (Signed)
Yes i am aware and agree with management.

## 2016-01-29 NOTE — Telephone Encounter (Signed)
-----   Message from Wilhelmina Mcardle, MD sent at 01/29/2016 12:28 PM EST ----- Regarding: RE: consult wants sooner It sounds like her respiratory symptoms have improved since Jan. If this is true, 04/04 should be OK. She will need a CXR prior to that visit. If her respiratory symptoms are worsening or have worsened, you may overbook her with me (again, with CXR prior to visit)  Merton Border, MD PCCM service Mobile [phone] Pager [phone]  ----- Message -----    From: Renelda Mom, LPN    Sent: [id number]   2:52 PM      To: Wilhelmina Mcardle, MD, Vilinda Boehringer, MD, # Subject: FW: consult wants sooner                       Will you guys look at this pt's CT and see how soon you think I need to work her in. Right now she is scheduled for 03/08/16. Anywhere I put her in someone will be double booked. Please let me know how soon. Thanks.  Azriel Jakob ----- Message -----    From: Clarisse Gouge    Sent: 01/27/2016   1:15 PM      To: Renelda Mom, LPN Subject: consult wants sooner                           Patient wants to be seen sooner pcp said ct scan showed nodules ? Cancer.  Patient wants to be worked in [place] .   Added to waitlist.

## 2016-01-29 NOTE — Telephone Encounter (Signed)
Spoke with pt and she states her cough had been bad but is better now. Informed pt to get CXR prior to appt. Nothing further needed.

## 2016-02-01 ENCOUNTER — Encounter: Payer: Self-pay | Admitting: Adult Health

## 2016-02-01 ENCOUNTER — Ambulatory Visit (INDEPENDENT_AMBULATORY_CARE_PROVIDER_SITE_OTHER): Payer: Medicare Other | Admitting: Adult Health

## 2016-02-01 VITALS — BP 142/82 | HR 72 | Resp 20 | Ht 63.0 in | Wt 166.0 lb

## 2016-02-01 DIAGNOSIS — G8929 Other chronic pain: Secondary | ICD-10-CM | POA: Diagnosis not present

## 2016-02-01 DIAGNOSIS — Z79891 Long term (current) use of opiate analgesic: Secondary | ICD-10-CM | POA: Diagnosis not present

## 2016-02-01 DIAGNOSIS — M797 Fibromyalgia: Secondary | ICD-10-CM | POA: Diagnosis not present

## 2016-02-01 DIAGNOSIS — M545 Low back pain: Secondary | ICD-10-CM

## 2016-02-01 DIAGNOSIS — G4762 Sleep related leg cramps: Secondary | ICD-10-CM

## 2016-02-01 DIAGNOSIS — Z5181 Encounter for therapeutic drug level monitoring: Secondary | ICD-10-CM

## 2016-02-01 MED ORDER — OXYCODONE-ACETAMINOPHEN 5-325 MG PO TABS: 1.0000 | ORAL_TABLET | Freq: Four times a day (QID) | ORAL | Status: DC | PRN

## 2016-02-01 NOTE — Progress Notes (Signed)
PATIENT: Kelli Morris DOB: 12-22-1950  REASON FOR VISIT: follow up- fibromyalgia HISTORY FROM: patient  HISTORY OF PRESENT ILLNESS:  Kelli Morris is a 65 year old female with a history of fibromyalgia and chronic low back pain. She returns today for follow-up. The patient continues to take carbamazepine for sharp shooting pains in the legs. She states that this continues to be beneficial. She also uses Percocet for severe pain in the lower back. She states that she only takes half a tablet usually at bedtime. She states that her back pain normally occurs when she is trying to go to bed. She does not have to take it nightly. She does state that she also has leg cramps only occurring at night. These are intermittent. She states that the cramps can be severe enough that she has to get out of bed and put hot packs on her legs. In the past she has tried Flexeril with good benefit however it did cause some dizziness the next morning. She reports that she recently had pneumonia and they found some nodules in her lungs. Unsure if this is pulmonary fibrosis or a malignancy. She has an appointment with a pulmonologist in the next couple weeks. She denies any new neurological symptoms. She returns today for an evaluation. HISTORY  07/30/15: Kelli Morris is a 65 year old female with a history of fibromyalgia. She returns today for follow-up. The patient continues to take Percocet for severe pain. She states that she only takes half a tablet at bedtime if needed. She states higher doses of narcotic medication causes her to feel nauseous. She states otherwise she takes aspirin. The patient was having sharp stabbing pains in the legs that were intermittent. She was started on carbamazepine. She states this has been beneficial. She states the pains are not that often now. However she is only taken 1 dose of the carbamazepine. She states that she forgets to take the nighttime dose. Patient states that she does have leg cramps  at night occasionally. She states that she normally can take a spoonful of mustard and this will resolve the cramps. She denies any new neurological symptoms. She returns today for an evaluation.  HISTORY 01/29/15: Kelli Morris is a 65 year old female with a history of fibromyalgia. She returns today for follow-up. The patient is currently taking hydrocodone for her pain. She reports that she feels that her pain was better controlled on percocet. She thought the hydrocodone would be better after she had surgical procedure. She states that she can only take 1/2 tablet daily because it causes her to be nausea. 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 The patient is still smoking. She has cut back but has been unable to quit.   HISTORY 07/29/14: Kelli Morris is a 65 year old right-handed white female with a history of fibromyalgia. The patient has been under a lot of stress since the death of her father. The patient has had recent issues with peripheral vascular disease, and she has just now started to get into some exercise. The patient is not sleeping well at night. She indicates that she was given hydrocodone for pain following a surgical procedure, and this has worked better than [employer], as it causes less nausea. The patient still is smoking, even after a trial on Chantix. The patient returns for an evaluation.  REVIEW OF SYSTEMS: Out of a complete 14 system review of symptoms, the  patient complains only of the following symptoms, and all other reviewed systems are negative.  Runny nose, trouble swallowing  ALLERGIES: No Known Allergies  HOME MEDICATIONS: Outpatient Prescriptions Prior to Visit  Medication Sig Dispense Refill  . ALPRAZolam (XANAX) 0.5 MG tablet Take 0.5 mg by mouth at bedtime as needed for sleep.    . Aspirin-Caffeine (BC FAST PAIN RELIEF ARTHRITIS) 1000-65 MG PACK Take 1,000 mg by mouth 4 (four) times  daily.    Marland Kitchen atorvastatin (LIPITOR) 40 MG tablet Take 1 tablet (40 mg total) by mouth daily. 30 tablet 3  . carbamazepine (TEGRETOL) 100 MG chewable tablet TAKE 1 TABLET TWICE DAILY (Patient taking differently: TAKE 1 TABLET BY MOUTH TWICE DAILY) 60 tablet 3  . cholecalciferol (VITAMIN D) 1000 UNITS tablet Take 1,000 Units by mouth 2 (two) times a week.     . Cyanocobalamin 1000 MCG/ML KIT Inject 1,000 mcg as directed every 7 (seven) days.     Marland Kitchen DHEA 25 MG CAPS Take 25 mg by mouth daily.    . Furosemide (LASIX PO) Take 10 mg by mouth as needed (AS NEEDED FOR FLUID OR SWELLING). Reported on 01/14/2016    . Magnesium Oxide 250 MG TABS Take 250 mg by mouth daily.    Marland Kitchen oxyCODONE-acetaminophen (PERCOCET/ROXICET) 5-325 MG tablet Take 1 tablet by mouth every 6 (six) hours as needed. (Patient taking differently: Take 1 tablet by mouth every 6 (six) hours as needed for moderate pain or severe pain. ) 30 tablet 0  . thyroid (ARMOUR) 60 MG tablet Take 120 mg by mouth 2 (two) times daily. Two tablets daily    . varenicline (CHANTIX STARTING MONTH PAK) 0.5 MG X 11 & 1 MG X 42 tablet Take one 0.5 mg tablet by mouth once daily for 3 days, then increase to one 0.5 mg tablet twice daily for 4 days, then increase to one 1 mg tablet twice daily. (Patient not taking: Reported on 12/15/2015) 53 tablet 0   No facility-administered medications prior to visit.    PAST MEDICAL HISTORY: Past Medical History  Diagnosis Date  . Fibromyalgia     Chronic fatigue  . GERD (gastroesophageal reflux disease)   . History of headache   . Strain, cervical   . History of low back pain   . Hypothyroidism   . Lyme disease   . Headache(784.0)   . Hypertension   . Hyperlipidemia   . Thrombosis   . Cancer ([place])     uterine  . Arrhythmia     afib  . Tobacco use   . PVD (peripheral vascular disease) ([place])     PAST SURGICAL HISTORY: Past Surgical History  Procedure Laterality Date  . Tonsillectomy    . Tendon repair Right       Hand  . Abdominal hysterectomy    . Tubal ligation Bilateral   . Ovary surgery    . Vulvectomy partial    . Lumbar spine surgery    . Ulnar nerve transposition Left   . Vagina surgery      biopsy    FAMILY HISTORY: Family History  Problem Relation Age of Onset  . Uterine cancer Mother   . Leukemia Mother   . Congestive Heart Failure Mother   . COPD Mother   . Diabetes Father   . Congestive Heart Failure Father   . Heart disease Maternal Grandfather   . Heart disease Paternal Grandfather   . Arthritis Brother   . Neuropathy Brother  SOCIAL HISTORY: Social History   Social History  . Marital Status: Married    Spouse Name: Shanon Brow  . Number of Children: 2  . Years of Education: 14   Occupational History  . Unemployed     Disability   Social History Main Topics  . Smoking status: Current Every Day Smoker -- 0.50 packs/day for 45 years    Types: Cigarettes  . Smokeless tobacco: Never Used  . Alcohol Use: Yes     Comment: Consumes alcohol on occasion  . Drug Use: No  . Sexual Activity: Not on file   Other Topics Concern  . Not on file   Social History Narrative   Patient is married for 17 years and lives with her husband Shanon Brow)   Retired    Scientist, physiological- college   Right handed.   Caffeine- sodas three daily.      PHYSICAL EXAM  Filed Vitals:   02/01/16 0732  BP: 142/82  Pulse: 72  Resp: 20  Height: '5\' 3"'  (1.6 m)  Weight: 166 lb (75.297 kg)   Body mass index is 29.41 kg/(m^2).  Generalized: Well developed, in no acute distress   Neurological examination  Mentation: Alert oriented to time, place, history taking. Follows all commands speech and language fluent Cranial nerve II-XII: Pupils were equal round reactive to light. Extraocular movements were full, visual field were full on confrontational test. Facial sensation and strength were normal. Uvula tongue midline. Head turning and shoulder shrug  were normal and symmetric. Motor: The motor  testing reveals 5 over 5 strength of all 4 extremities. Good symmetric motor tone is noted throughout.  Sensory: Sensory testing is intact to soft touch on all 4 extremities. No evidence of extinction is noted.  Coordination: Cerebellar testing reveals good finger-nose-finger and heel-to-shin bilaterally.  Gait and station: Gait is normal. Tandem gait is Unsteady. Romberg is negative. No drift is seen.  Reflexes: Deep tendon reflexes are symmetric and normal bilaterally.   DIAGNOSTIC DATA (LABS, IMAGING, TESTING) - I reviewed patient records, labs, notes, testing and imaging myself where available.  Lab Results  Component Value Date   WBC 7.7 07/31/2015   HGB 13.3 07/31/2015   HCT 40.3 07/31/2015   MCV 93.8 07/31/2015   PLT 269 07/31/2015      Component Value Date/Time   NA 138 07/31/2015 1145   NA 135 06/11/2013 0840   K 3.9 07/31/2015 1145   CL 106 07/31/2015 1145   CO2 25 07/31/2015 1145   GLUCOSE 135* 07/31/2015 1145   GLUCOSE 91 06/11/2013 0840   BUN 10 07/31/2015 1145   BUN 14 06/11/2013 0840   CREATININE 0.98 07/31/2015 1145   CALCIUM 8.9 07/31/2015 1145   PROT 6.5 06/11/2013 0840   ALBUMIN 4.4 06/11/2013 0840   AST 15 06/11/2013 0840   ALT 12 06/11/2013 0840   ALKPHOS 103 06/11/2013 0840   BILITOT 0.1 06/11/2013 0840   GFRNONAA 60* 07/31/2015 1145   GFRAA >60 07/31/2015 1145   Lab Results  Component Value Date   CHOL 207* 12/15/2015   HDL 44 12/15/2015   LDLCALC 149* 12/15/2015   TRIG 70 12/15/2015   CHOLHDL 4.7* 12/15/2015      ASSESSMENT AND PLAN 65 y.o. year old female  has a past medical history of Fibromyalgia; GERD (gastroesophageal reflux disease); History of headache; Strain, cervical; History of low back pain; Hypothyroidism; Lyme disease; Headache(784.0); Hypertension; Hyperlipidemia; Thrombosis; Cancer ([place]); Arrhythmia; Tobacco use; and PVD (peripheral vascular disease) ([place]). here with:  1.  Fibromyalgia 2. Chronic low back pain 3. Nocturnal  leg cramps  The patient will remain on carbamazepine. I will give her a refill on her Percocet today. I will also check a urine drug screen. The patient recently had blood work at the end of June. I do not have these results. As far as the leg cramps we could potentially try Flexeril again however I'll be curious to see what her blood work was before starting any new medication. Patient will call with these results. She will follow-up in 6 months with Dr. Jannifer Franklin or sooner if needed.  Ward Givens, MSN, NP-C 02/01/2016, 7:41 AM [facility] [address], [place], [place] [address] [phone]

## 2016-02-01 NOTE — Patient Instructions (Signed)
Continue Carbamazepine Urine drug screen today If your symptoms worsen or you develop new symptoms please let us know.

## 2016-02-01 NOTE — Progress Notes (Signed)
I have read the note, and I agree with the clinical assessment and plan.  WILLIS,CHARLES KEITH   

## 2016-02-04 ENCOUNTER — Other Ambulatory Visit (INDEPENDENT_AMBULATORY_CARE_PROVIDER_SITE_OTHER): Payer: Medicare Other

## 2016-02-04 DIAGNOSIS — E785 Hyperlipidemia, unspecified: Secondary | ICD-10-CM

## 2016-02-05 LAB — LIPID PANEL
CHOL/HDL RATIO: 3.8 ratio (ref 0.0–4.4)
Cholesterol, Total: 172 mg/dL (ref 100–199)
HDL: 45 mg/dL (ref >39)
LDL Calculated: 115 mg/dL — ABNORMAL HIGH (ref 0–99)
Triglycerides: 62 mg/dL (ref 0–149)
VLDL Cholesterol Cal: 12 mg/dL (ref 5–40)

## 2016-02-05 LAB — HEPATIC FUNCTION PANEL
ALBUMIN: 4 g/dL (ref 3.6–4.8)
ALK PHOS: 106 IU/L (ref 39–117)
ALT: 12 IU/L (ref 0–32)
AST: 18 IU/L (ref 0–40)
BILIRUBIN, DIRECT: 0.05 mg/dL (ref 0.00–0.40)
TOTAL PROTEIN: 6.6 g/dL (ref 6.0–8.5)

## 2016-02-06 LAB — COMPREHENSIVE DRUG ANALYSIS,UR: PDF: 0

## 2016-02-08 ENCOUNTER — Telehealth: Payer: Self-pay | Admitting: *Deleted

## 2016-02-08 NOTE — Telephone Encounter (Signed)
Spoke to pt and relayed the results of labs. (OK). She verbalized understanding, did see on my chart.

## 2016-02-08 NOTE — Telephone Encounter (Signed)
-----   Message from Ward Givens, NP sent at 02/08/2016 12:08 PM EST ----- Lab work ok. Please call patient.

## 2016-02-10 ENCOUNTER — Ambulatory Visit (INDEPENDENT_AMBULATORY_CARE_PROVIDER_SITE_OTHER): Payer: Medicare Other | Admitting: Family Medicine

## 2016-02-10 ENCOUNTER — Encounter: Payer: Self-pay | Admitting: Family Medicine

## 2016-02-10 VITALS — BP 132/74 | HR 64 | Temp 97.5°F | Ht 63.0 in | Wt 171.0 lb

## 2016-02-10 DIAGNOSIS — Z1239 Encounter for other screening for malignant neoplasm of breast: Secondary | ICD-10-CM | POA: Diagnosis not present

## 2016-02-10 DIAGNOSIS — Z1159 Encounter for screening for other viral diseases: Secondary | ICD-10-CM

## 2016-02-10 DIAGNOSIS — Z23 Encounter for immunization: Secondary | ICD-10-CM

## 2016-02-10 DIAGNOSIS — Z114 Encounter for screening for human immunodeficiency virus [HIV]: Secondary | ICD-10-CM

## 2016-02-10 DIAGNOSIS — E669 Obesity, unspecified: Secondary | ICD-10-CM

## 2016-02-10 DIAGNOSIS — Z0001 Encounter for general adult medical examination with abnormal findings: Secondary | ICD-10-CM | POA: Diagnosis not present

## 2016-02-10 DIAGNOSIS — R6889 Other general symptoms and signs: Secondary | ICD-10-CM | POA: Diagnosis not present

## 2016-02-10 DIAGNOSIS — R600 Localized edema: Secondary | ICD-10-CM

## 2016-02-10 LAB — CBC
HCT: 40.1 % (ref 36.0–46.0)
Hemoglobin: 13.2 g/dL (ref 12.0–15.0)
MCHC: 32.9 g/dL (ref 30.0–36.0)
MCV: 93.6 fl (ref 78.0–100.0)
PLATELETS: 312 10*3/uL (ref 150.0–400.0)
RBC: 4.28 Mil/uL (ref 3.87–5.11)
RDW: 16.6 % — AB (ref 11.5–15.5)
WBC: 8.4 10*3/uL (ref 4.0–10.5)

## 2016-02-10 LAB — TSH: TSH: 3.54 u[IU]/mL (ref 0.35–4.50)

## 2016-02-10 LAB — BASIC METABOLIC PANEL
BUN: 20 mg/dL (ref 6–23)
CHLORIDE: 101 meq/L (ref 96–112)
CO2: 25 mEq/L (ref 19–32)
Calcium: 9.6 mg/dL (ref 8.4–10.5)
Creatinine, Ser: 0.96 mg/dL (ref 0.40–1.20)
GFR: 62.02 mL/min (ref >60.00)
Glucose, Bld: 91 mg/dL (ref 70–99)
POTASSIUM: 4.5 meq/L (ref 3.5–5.1)
SODIUM: 135 meq/L (ref 135–145)

## 2016-02-10 LAB — HEMOGLOBIN A1C: HEMOGLOBIN A1C: 6.1 % (ref 4.6–6.5)

## 2016-02-10 NOTE — Assessment & Plan Note (Signed)
Suspect lower extremity edema is likely related to chronic venous insufficiency. She had a recent ultrasound that was negative for DVT. We'll continue support stockings and leg elevation. Lab work as outlined below to evaluate for other causes.

## 2016-02-10 NOTE — Assessment & Plan Note (Signed)
Overall patient is doing well. Chronic medical issues are stable and unchanged. Advised on increasing exercise. Advised on diet. We'll obtain lab work as outlined below. Mammogram ordered. She is advised to follow-up with her gynecologist for her pelvic exam. Given Tdap, flu shot, and Zostavax today. Advised to quit smoking. Uninterested in quitting at this time. She does not want to do a colonoscopy and thus she was given stool cards.

## 2016-02-10 NOTE — Progress Notes (Signed)
Pre visit review using our clinic review tool, if applicable. No additional management support is needed unless otherwise documented below in the visit note. 

## 2016-02-10 NOTE — Patient Instructions (Signed)
Nice to see you. Please continue to work on diet and exercise. We will check lab work today. We will schedule you for a mammogram. Please consider quitting smoking. Please keep your legs elevated as much as possible and start to wear compression stockings. If you develop chest pain, shortness of breath, increased swelling, pain in one leg or the other, or any new or changing symptoms please seek medical attention.

## 2016-02-10 NOTE — Progress Notes (Signed)
Patient ID: Kelli Morris, female   DOB: [birth year], 65 y.o.   MRN: [id number]  Tommi Rumps, MD Phone: [phone]  Kelli Morris is a 65 y.o. female who presents today for complete physical exam.  Patient reports overall doing well. Chronic issues are stable and unchanged. She does report swelling in her right leg but is unchanged from previously. It is worse at night after being on her feet all day. Swelling is resolved in the morning after lying down at night. No shortness of breath, chest pain, orthopnea, or PND. She had an ultrasound of her right lower extremity to evaluate for DVT that was negative. She is not using her compression stockings. Does not exercise very much. She is going to start Silver sneakers and water aerobics. Diet is described as regular. Not much fried foods. Drinks regular ginger ale. She is started drinking more water. Never had a hepatitis C or HIV test. No recent mammogram. Patient had a hysterectomy for uterine cancer when she was in her 21s and has had vulvar dysplasia. She has Pap smears performed by [facility]cology. She needs follow-up with them. Declines colonoscopy. We'll do stool cards. Last Tdap was greater than 10 years ago. Has not gotten a flu shot this year. No Zostavax. Smokes half a pack per day and is not interested in quitting at this time. Rare alcohol use. No illicit drug use. Reports she has an appointment with pulmonology next month.  Active Ambulatory Problems    Diagnosis Date Noted  . Encounter for therapeutic drug monitoring 01/25/2013  . Pain in limb 01/25/2013  . Lumbosacral spondylosis without myelopathy 01/25/2013  . Spinal stenosis, unspecified region other than cervical 01/25/2013  . Meningitis, unspecified(322.9) 01/25/2013  . Chronic fatigue syndrome 01/25/2013  . Myalgia and myositis, unspecified 01/25/2013  . Chest heaviness 07/04/2013  . SOB (shortness of breath) 07/04/2013  . Claudication ([place]) 07/04/2013  . Bilateral  leg edema 07/04/2013  . Tobacco use   . Dizziness 11/09/2013  . Right leg swelling 07/31/2015  . PAD (peripheral artery disease) ([place]) 07/31/2015  . Right leg DVT ([place]) 09/11/2015  . Hyperlipidemia 12/15/2015  . Chronic headaches 01/17/2016  . History of pneumonia 01/17/2016  . Anxiety 01/17/2016  . Encounter for general adult medical examination with abnormal findings 02/10/2016   Resolved Ambulatory Problems    Diagnosis Date Noted  . No Resolved Ambulatory Problems   Past Medical History  Diagnosis Date  . Fibromyalgia   . GERD (gastroesophageal reflux disease)   . History of headache   . Strain, cervical   . History of low back pain   . Hypothyroidism   . Lyme disease   . Headache(784.0)   . Hypertension   . Thrombosis   . Cancer ([place])   . Arrhythmia   . PVD (peripheral vascular disease) ([facility])     Family History  Problem Relation Age of Onset  . Uterine cancer Mother   . Leukemia Mother   . Congestive Heart Failure Mother   . COPD Mother   . Diabetes Father   . Congestive Heart Failure Father   . Heart disease Maternal Grandfather   . Heart disease Paternal Grandfather   . Arthritis Brother   . Neuropathy Brother     Social History   Social History  . Marital Status: Married    Spouse Name: Shanon Brow  . Number of Children: 2  . Years of Education: 14   Occupational History  . Unemployed     Disability  Social History Main Topics  . Smoking status: Current Every Day Smoker -- 0.50 packs/day for 45 years    Types: Cigarettes  . Smokeless tobacco: Never Used  . Alcohol Use: Yes     Comment: Consumes alcohol on occasion  . Drug Use: No  . Sexual Activity: Not on file   Other Topics Concern  . Not on file   Social History Narrative   Patient is married for 13 years and lives with her husband Shanon Brow)   Retired    Scientist, physiological- college   Right handed.   Caffeine- sodas three daily.    ROS   General:  Negative for nexplained weight loss,  fever Skin: Negative for new or changing mole, sore that won't heal HEENT: Negative for trouble hearing, trouble seeing, ringing in ears, mouth sores, hoarseness, change in voice, dysphagia. CV:  Positive for edema, Negative for chest pain, dyspnea, palpitations Resp: Positive for cough, Negative for dyspnea, hemoptysis GI: Negative for nausea, vomiting, diarrhea, constipation, abdominal pain, melena, hematochezia. GU: Negative for dysuria, incontinence, urinary hesitance, hematuria, vaginal or penile discharge, polyuria, sexual difficulty, lumps in testicle or breasts MSK: Negative for muscle cramps or aches, joint pain or swelling Neuro: Positive (the following issues are chronic and followed by neurology and are unchanged since her last visit) for headaches, weakness, numbness, dizziness, negative for passing out/fainting Psych: Negative for depression, anxiety, memory problems  Objective  Physical Exam Filed Vitals:   02/10/16 0832  BP: 132/74  Pulse: 64  Temp: 97.5 F (36.4 C)    BP Readings from Last 3 Encounters:  02/10/16 132/74  02/01/16 142/82  01/14/16 124/70   Wt Readings from Last 3 Encounters:  02/10/16 171 lb (77.565 kg)  02/01/16 166 lb (75.297 kg)  01/14/16 167 lb 12.8 oz (76.114 kg)    Physical Exam  Constitutional: She is well-developed, well-nourished, and in no distress.  HENT:  Head: Normocephalic and atraumatic.  Right Ear: External ear normal.  Left Ear: External ear normal.  Mouth/Throat: Oropharynx is clear and moist. No oropharyngeal exudate.  Eyes: Conjunctivae are normal. Pupils are equal, round, and reactive to light.  Neck: Neck supple.  Cardiovascular: Normal rate and normal heart sounds.  Exam reveals no gallop and no friction rub.   No murmur heard. Pulmonary/Chest: Effort normal. No respiratory distress. She has no wheezes. She has no rales.  Crackles in the mid and lower lung fields bilaterally  Abdominal: Soft. Bowel sounds are  normal. She exhibits no distension. There is no tenderness. There is no rebound.  Musculoskeletal: She exhibits no edema.  Lymphadenopathy:    She has no cervical adenopathy.  Neurological: She is alert. Gait normal.  Skin: Skin is warm and dry. She is not diaphoretic.  Psychiatric: Mood and affect normal.     Assessment/Plan:   Bilateral leg edema Suspect lower extremity edema is likely related to chronic venous insufficiency. She had a recent ultrasound that was negative for DVT. We'll continue support stockings and leg elevation. Lab work as outlined below to evaluate for other causes.  Encounter for general adult medical examination with abnormal findings Overall patient is doing well. Chronic medical issues are stable and unchanged. Advised on increasing exercise. Advised on diet. We'll obtain lab work as outlined below. Mammogram ordered. She is advised to follow-up with her gynecologist for her pelvic exam. Given Tdap, flu shot, and Zostavax today. Advised to quit smoking. Uninterested in quitting at this time. She does not want to do a colonoscopy and  thus she was given stool cards.    Orders Placed This Encounter  Procedures  . MM Digital Screening    Standing Status: Future     Number of Occurrences:      Standing Expiration Date: 04/11/2017    Order Specific Question:  Reason for Exam (SYMPTOM  OR DIAGNOSIS REQUIRED)    Answer:  breast cancer screening    Order Specific Question:  Preferred imaging location?    Answer:  [facility]  . Flu Vaccine QUAD 36+ mos IM  . Tdap vaccine greater than or equal to 7yo IM  . Varicella-zoster vaccine subcutaneous  . TSH  . HgB A1c  . CBC  . Basic Metabolic Panel (BMET)  . Hepatitis C Antibody  . HIV antibody (with reflex)    No orders of the defined types were placed in this encounter.     Tommi Rumps, MD [place]

## 2016-02-11 ENCOUNTER — Encounter: Payer: Self-pay | Admitting: Family Medicine

## 2016-02-11 LAB — HEPATITIS C ANTIBODY: HCV Ab: NEGATIVE

## 2016-02-11 LAB — HIV ANTIBODY (ROUTINE TESTING W REFLEX): HIV 1&2 Ab, 4th Generation: NONREACTIVE

## 2016-02-17 ENCOUNTER — Other Ambulatory Visit: Payer: Self-pay | Admitting: Family Medicine

## 2016-02-17 ENCOUNTER — Ambulatory Visit
Admission: RE | Admit: 2016-02-17 | Discharge: 2016-02-17 | Disposition: A | Discharge status: Home / Self Care | Payer: Medicare Other | Source: Ambulatory Visit | Attending: Family Medicine | Admitting: Family Medicine

## 2016-02-17 ENCOUNTER — Encounter: Payer: Self-pay | Admitting: Family Medicine

## 2016-02-17 DIAGNOSIS — Z1239 Encounter for other screening for malignant neoplasm of breast: Secondary | ICD-10-CM

## 2016-02-17 DIAGNOSIS — Z1231 Encounter for screening mammogram for malignant neoplasm of breast: Secondary | ICD-10-CM | POA: Diagnosis present

## 2016-02-22 ENCOUNTER — Other Ambulatory Visit: Payer: Medicare Other

## 2016-02-22 ENCOUNTER — Other Ambulatory Visit (INDEPENDENT_AMBULATORY_CARE_PROVIDER_SITE_OTHER): Payer: Medicare Other

## 2016-02-22 DIAGNOSIS — Z1211 Encounter for screening for malignant neoplasm of colon: Secondary | ICD-10-CM

## 2016-02-23 ENCOUNTER — Other Ambulatory Visit: Payer: Self-pay | Admitting: *Deleted

## 2016-02-23 DIAGNOSIS — Z1211 Encounter for screening for malignant neoplasm of colon: Secondary | ICD-10-CM

## 2016-02-24 LAB — FECAL OCCULT BLOOD, IMMUNOCHEMICAL: FECAL OCCULT BLD: NEGATIVE

## 2016-03-08 ENCOUNTER — Other Ambulatory Visit
Admission: RE | Admit: 2016-03-08 | Discharge: 2016-03-08 | Disposition: A | Discharge status: Home / Self Care | Payer: Medicare Other | Source: Ambulatory Visit | Attending: Pulmonary Disease | Admitting: Pulmonary Disease

## 2016-03-08 ENCOUNTER — Encounter: Payer: Self-pay | Admitting: Pulmonary Disease

## 2016-03-08 ENCOUNTER — Ambulatory Visit (INDEPENDENT_AMBULATORY_CARE_PROVIDER_SITE_OTHER): Payer: Medicare Other | Admitting: Pulmonary Disease

## 2016-03-08 VITALS — BP 122/64 | HR 75 | Ht 63.0 in | Wt 167.8 lb

## 2016-03-08 DIAGNOSIS — R911 Solitary pulmonary nodule: Secondary | ICD-10-CM

## 2016-03-08 DIAGNOSIS — Z86718 Personal history of other venous thrombosis and embolism: Secondary | ICD-10-CM

## 2016-03-08 DIAGNOSIS — J84112 Idiopathic pulmonary fibrosis: Secondary | ICD-10-CM | POA: Insufficient documentation

## 2016-03-08 DIAGNOSIS — F17200 Nicotine dependence, unspecified, uncomplicated: Secondary | ICD-10-CM | POA: Insufficient documentation

## 2016-03-08 DIAGNOSIS — Z720 Tobacco use: Secondary | ICD-10-CM | POA: Diagnosis not present

## 2016-03-08 DIAGNOSIS — M06079 Rheumatoid arthritis without rheumatoid factor, unspecified ankle and foot: Secondary | ICD-10-CM | POA: Insufficient documentation

## 2016-03-08 NOTE — Patient Instructions (Signed)
You have pulmonary fibrosis demonstrated on CT scan of chest. This is either a condition called IPF (idiopathic pulmonary fibrosis) or is related to possible rheumatoid arthritis. You must quit smoking to limit further damage to your lungs Our evaluation will be blood tests today Follow up in 5-6 weeks with pulmonary function tests and CXR

## 2016-03-09 LAB — RHEUMATOID FACTOR: Rhuematoid fact SerPl-aCnc: <10 IU/mL (ref 0.0–13.9)

## 2016-03-09 LAB — ANA W/REFLEX: ANA: NEGATIVE

## 2016-03-10 NOTE — Progress Notes (Signed)
PULMONARY CONSULT NOTE  Requesting MD/Service: Caryl Bis, MD Date of initial consultation: 03/08/16 Reason for consultation: Pulmonary fibrosis  PT PROFILE: 65 y.o. F smoker referred for evaluation of pulmonary fibrosis  HPI:  89 F smoker who initially presented with a "bad cold" in Nov 2016 with persistent symptoms into December when a CXR was performed and she was diagnosed with pneumonia. She was treated with antibiotics and believes that these were beneficial. However, she continues to have persistent cough which is nonproductive. Because of the abnormal CXR, she underwent CT chest with findings c/w IPF and she is referred for further evaluation. She reports persitent NP cough and mild SOB which is not limiting.She has never been told that she had an abnormal CXR in past. She does have a history of arthritis which has been labelled as RA in the past and she was even on MTX briefly years ago. She denies rash or other skin lesions, sicca symptoms, renal disease. She has a history of prior DVT that occurred after LE injury. She continues to smoke less than one PPD  Past Medical History  Diagnosis Date  . Fibromyalgia     Chronic fatigue  . GERD (gastroesophageal reflux disease)   . History of headache   . Strain, cervical   . History of low back pain   . Hypothyroidism   . Lyme disease   . Headache(784.0)   . Hypertension   . Hyperlipidemia   . Thrombosis   . Arrhythmia     afib  . Tobacco use   . PVD (peripheral vascular disease) ([place])   . Cancer [facility])     uterine    Past Surgical History  Procedure Laterality Date  . Tonsillectomy    . Tendon repair Right     Hand  . Abdominal hysterectomy    . Tubal ligation Bilateral   . Ovary surgery    . Vulvectomy partial    . Lumbar spine surgery    . Ulnar nerve transposition Left   . Vagina surgery      biopsy  . Breast biopsy Left 1990    MEDICATIONS: I have reviewed all medications and confirmed regimen as  documented  Social History   Social History  . Marital Status: Married    Spouse Name: Shanon Brow  . Number of Children: 2  . Years of Education: 14   Occupational History  . Unemployed     Disability   Social History Main Topics  . Smoking status: Current Every Day Smoker -- 0.50 packs/day for 45 years    Types: Cigarettes  . Smokeless tobacco: Never Used  . Alcohol Use: Yes     Comment: Consumes alcohol on occasion  . Drug Use: No  . Sexual Activity: Not on file   Other Topics Concern  . Not on file   Social History Narrative   Patient is married for 18 years and lives with her husband Shanon Brow)   Retired    Scientist, physiological- college   Right handed.   Caffeine- sodas three daily.    Family History  Problem Relation Age of Onset  . Uterine cancer Mother   . Leukemia Mother   . Congestive Heart Failure Mother   . COPD Mother   . Diabetes Father   . Congestive Heart Failure Father   . Heart disease Maternal Grandfather   . Heart disease Paternal Grandfather   . Arthritis Brother   . Neuropathy Brother   . Breast cancer Neg Hx  ROS: No fever, unexplained weight loss or weight gain No new focal weakness or sensory deficits No otalgia, hearing loss, visual changes, nasal and sinus symptoms, mouth and throat problems No neck pain or adenopathy No abdominal pain, N/V/D, diarrhea, change in bowel pattern No dysuria, change in urinary pattern No LE edema or calf tenderness She does have chronic back pain and neuropathic pain    Filed Vitals:   03/08/16 1021  BP: 122/64  Pulse: 75  Height: '5\' 3"'  (1.6 m)  Weight: 167 lb 12.8 oz (76.114 kg)  SpO2: 96%     EXAM:  Gen: WDWN, No overt respiratory distress HEENT: NCAT, TMs and canals normal, sclera white, nares and nasal mucosa normal, oropharynx normal Neck: Supple without LAN, thyromegaly, JVD Lungs: breath sounds full, percussion note normal, bilateral fine crackles Cardiovascular: Normal rate, reg rhythm, no  murmurs noted Abdomen: Soft, nontender, normal BS Ext: without clubbing. Mild R ankle edema Neuro: CNs grossly intact, motor and sensory intact, DTRs symmetric Skin: Limited exam, no lesions noted  DATA:   BMP Latest Ref Rng 02/10/2016 07/31/2015 06/11/2013  Glucose 70 - 99 mg/dL 91 135(H) 91  BUN 6 - 23 mg/dL '20 10 14  ' Creatinine 0.40 - 1.20 mg/dL 0.96 0.98 0.82  BUN/Creat Ratio 11 - 26 - - 17  Sodium 135 - 145 mEq/L 135 138 135  Potassium 3.5 - 5.1 mEq/L 4.5 3.9 3.2(L)  Chloride 96 - 112 mEq/L 101 106 93(L)  CO2 19 - 32 mEq/L 25 25 32(H)  Calcium 8.4 - 10.5 mg/dL 9.6 8.9 10.4(H)    CBC Latest Ref Rng 02/10/2016 07/31/2015 06/11/2013  WBC 4.0 - 10.5 K/uL 8.4 7.7 8.3  Hemoglobin 12.0 - 15.0 g/dL 13.2 13.3 11.6  Hematocrit 36.0 - 46.0 % 40.1 40.3 34.0  Platelets 150.0 - 400.0 K/uL 312.0 269 333    CXR:  N/A  HRCT 01/26/16: IMPRESSION: 1. Extensive peripheral basilar predominant reticulation, ground-glass attenuation and traction bronchiectasis in both lungs, with early honeycombing in the lower lobes. 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 2. Indeterminate irregular 1.2 cm nodular focus of consolidation in the basilar right lower lobe. This may represent part of the interstitial lung disease, however a true pulmonary nodule cannot be excluded. Recommend short-term follow-up routine unenhanced chest CT in 3 months.  IMPRESSION:     ICD-9-CM ICD-10-CM   1. History of DVT (deep vein thrombosis) V12.51 Z86.718   2. IPF (idiopathic pulmonary fibrosis) (HCC) 516.31 J84.112 Pulmonary function test     DG Chest 2 View     Rheumatoid Factor     Antinuclear Antib (ANA)     Sed Rate (ESR)  3. Smoker 305.1 Z72.0   4. 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 Lung nodule 793.11 R91.1   1) Pulmonary fibrosis - CT scan appearance highly suggestive of UIP. The history of  arthritis suggests possible RA. Therefore, this is either IPF or rheumatoid lung 2) RLL lung nodule - incidentally found on CT chest. The appearance is indeterminate and in a background of underlying fibrosis. Because she is a smoker and has pulmonary fiborsis, both of which increase her risk of malignancy, this will have to be followed 3) Smoker 4) history of DVT - fully treated. Mild residual venous insufficiency  PLAN:  1) Check RF, ANA today 2) counseled re: imperative of smoking cessation 3) ROV 4-6 weeks with PFTs and CXR to establish baseline (as we  only have CT chest available) 4) she will need repeat CT chest to follow RLL nodule in 3 months (June)  Merton Border, MD PCCM service Mobile [phone] Pager [phone] 03/10/2016

## 2016-03-14 ENCOUNTER — Other Ambulatory Visit: Payer: Self-pay | Admitting: Cardiovascular Disease

## 2016-04-12 ENCOUNTER — Ambulatory Visit: Payer: Medicare Other | Admitting: Family Medicine

## 2016-04-27 ENCOUNTER — Ambulatory Visit
Admission: RE | Admit: 2016-04-27 | Discharge: 2016-04-27 | Disposition: A | Discharge status: Home / Self Care | Payer: Medicare Other | Source: Ambulatory Visit | Attending: Pulmonary Disease | Admitting: Pulmonary Disease

## 2016-04-27 ENCOUNTER — Ambulatory Visit (INDEPENDENT_AMBULATORY_CARE_PROVIDER_SITE_OTHER): Payer: Medicare Other | Admitting: *Deleted

## 2016-04-27 DIAGNOSIS — J84112 Idiopathic pulmonary fibrosis: Secondary | ICD-10-CM

## 2016-04-27 DIAGNOSIS — J849 Interstitial pulmonary disease, unspecified: Secondary | ICD-10-CM | POA: Diagnosis not present

## 2016-04-27 LAB — PULMONARY FUNCTION TEST
DL/VA % PRED: 77 %
DL/VA: 3.68 ml/min/mmHg/L
DLCO unc % pred: 49 %
DLCO unc: 11.61 ml/min/mmHg
FEF 25-75 POST: 1.31 L/s
FEF 25-75 Pre: 1.39 L/sec
FEF2575-%Change-Post: -5 %
FEF2575-%PRED-POST: 62 %
FEF2575-%Pred-Pre: 66 %
FEV1-%Change-Post: 0 %
FEV1-%PRED-PRE: 61 %
FEV1-%Pred-Post: 62 %
FEV1-POST: 1.47 L
FEV1-PRE: 1.47 L
FEV1FVC-%CHANGE-POST: -6 %
FEV1FVC-%PRED-PRE: 108 %
FEV6-%Change-Post: 7 %
FEV6-%PRED-PRE: 59 %
FEV6-%Pred-Post: 63 %
FEV6-POST: 1.88 L
FEV6-Pre: 1.76 L
FEV6FVC-%PRED-POST: 104 %
FEV6FVC-%Pred-Pre: 104 %
FVC-%CHANGE-POST: 7 %
FVC-%PRED-POST: 60 %
FVC-%PRED-PRE: 56 %
FVC-POST: 1.88 L
FVC-Pre: 1.76 L
PRE FEV1/FVC RATIO: 83 %
Post FEV1/FVC ratio: 78 %
Post FEV6/FVC ratio: 100 %
Pre FEV6/FVC Ratio: 100 %
RV % PRED: 80 %
RV: 1.66 L
TLC % pred: 75 %
TLC: 3.74 L

## 2016-04-27 NOTE — Progress Notes (Signed)
PFT performed today. 

## 2016-04-29 ENCOUNTER — Encounter: Payer: Self-pay | Admitting: Pulmonary Disease

## 2016-04-29 ENCOUNTER — Ambulatory Visit (INDEPENDENT_AMBULATORY_CARE_PROVIDER_SITE_OTHER): Payer: Medicare Other | Admitting: Pulmonary Disease

## 2016-04-29 VITALS — BP 122/78 | HR 72 | Ht 63.5 in | Wt 171.0 lb

## 2016-04-29 DIAGNOSIS — Z720 Tobacco use: Secondary | ICD-10-CM

## 2016-04-29 DIAGNOSIS — R911 Solitary pulmonary nodule: Secondary | ICD-10-CM

## 2016-04-29 DIAGNOSIS — J84112 Idiopathic pulmonary fibrosis: Secondary | ICD-10-CM

## 2016-04-29 DIAGNOSIS — F17200 Nicotine dependence, unspecified, uncomplicated: Secondary | ICD-10-CM

## 2016-04-29 MED ORDER — VARENICLINE TARTRATE 0.5 MG X 11 & 1 MG X 42 PO MISC: ORAL | Status: DC

## 2016-04-29 MED ORDER — VARENICLINE TARTRATE 1 MG PO TABS: 1.0000 mg | ORAL_TABLET | Freq: Two times a day (BID) | ORAL | Status: DC

## 2016-04-29 NOTE — Patient Instructions (Signed)
Chantix for smoking cessation Follow up in 4-6 weeks

## 2016-05-02 NOTE — Progress Notes (Signed)
PULMONARY OFFICE FOLLOW UP NOTE  Requesting MD/Service: Caryl Bis, MD Date of initial consultation: 03/08/16 Reason for consultation: Pulmonary fibrosis  PT PROFILE: 65 y.o. F smoker referred for evaluation of pulmonary fibrosis  INITIAL HPI (03/08/16):  38 F smoker who initially presented with a "bad cold" in Nov 2016 with persistent symptoms into December when a CXR was performed and she was diagnosed with pneumonia. She was treated with antibiotics and believes that these were beneficial. However, she continues to have persistent cough which is nonproductive. Because of the abnormal CXR, she underwent CT chest with findings c/w IPF and she is referred for further evaluation. She reports persitent NP cough and mild SOB which is not limiting.She has never been told that she had an abnormal CXR in past. She does have a history of arthritis which has been labelled as RA in the past and she was even on MTX briefly years ago. She denies rash or other skin lesions, sicca symptoms, renal disease. She has a history of prior DVT that occurred after LE injury. She continues to smoke less than one PPD  DATA: 01/26/16 HRCT: Extensive peripheral basilar predominant reticulation, ground-glass attenuation and traction bronchiectasis in both lungs, with early honeycombing in the lower lobes. 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 Indeterminate irregular 1.2 cm nodular focus of consolidation in the basilar right lower lobe. This may represent part of the interstitial lung disease, however a true pulmonary nodule cannot be excluded.  04/27/16 PFTs: No obstruction, mild-mod restriction, mod decrease in DLCO 04/27/16 CXR: diffuse interstitial lung disease  SUBJ: No change in respiratory symptoms. Still smoking up to 1 PPD. Has used Chantix in past with some success and wants to try again   Filed Vitals:   04/29/16 0910  BP: 122/78  Pulse: 72  Height: 5' 3.5" (1.613  m)  Weight: 171 lb (77.565 kg)  SpO2: 99%     EXAM:  Gen: WDWN, No overt respiratory distress HEENT: WNL Neck: No JVD Lungs: bilateral fine crackles Cardiovascular: Normal rate, reg rhythm, no murmurs noted Abdomen: Soft, nontender, normal BS Ext: without clubbing. Neuro: normal  DATA:   BMP Latest Ref Rng 02/10/2016 07/31/2015 06/11/2013  Glucose 70 - 99 mg/dL 91 135(H) 91  BUN 6 - 23 mg/dL 20 10 14   Creatinine 0.40 - 1.20 mg/dL 0.96 0.98 0.82  BUN/Creat Ratio 11 - 26 - - 17  Sodium 135 - 145 mEq/L 135 138 135  Potassium 3.5 - 5.1 mEq/L 4.5 3.9 3.2(L)  Chloride 96 - 112 mEq/L 101 106 93(L)  CO2 19 - 32 mEq/L 25 25 32(H)  Calcium 8.4 - 10.5 mg/dL 9.6 8.9 10.4(H)    CBC Latest Ref Rng 02/10/2016 07/31/2015 06/11/2013  WBC 4.0 - 10.5 K/uL 8.4 7.7 8.3  Hemoglobin 12.0 - 15.0 g/dL 13.2 13.3 11.6  Hematocrit 36.0 - 46.0 % 40.1 40.3 34.0  Platelets 150.0 - 400.0 K/uL 312.0 269 333   RF, ANA  negative CXR:  As above    IMPRESSION:     ICD-9-CM ICD-10-CM   1. IPF (idiopathic pulmonary fibrosis) (HCC) 516.31 J84.112   2. Smoker 305.1 Z72.0   3. Lung nodule 793.11 R91.1   1) Pulmonary fibrosis - CT pattern c/w UIP. Reported history of RA but RF negative and not on any immune modulating therapies 2) RLL lung nodule - incidentally found on CT chest. The appearance is indeterminate and in a background of underlying fibrosis. Because she is a smoker and has pulmonary fiborsis,  both of which increase her risk of malignancy, this will have to be followed 3) Smoker 4) history of DVT - fully treated. Mild residual venous insufficiency  PLAN:  1) Counseled in depth re: smoking cessation.  2) Chantix rx prvided 3) She will need repeat CT chest to follow RLL nodule in July 4) ROV 4-6 weeks  Merton Border, MD PCCM service Mobile [phone] Pager [phone] 05/02/2016

## 2016-05-18 ENCOUNTER — Encounter: Payer: Self-pay | Admitting: Family Medicine

## 2016-05-18 ENCOUNTER — Ambulatory Visit (INDEPENDENT_AMBULATORY_CARE_PROVIDER_SITE_OTHER): Payer: Medicare Other | Admitting: Family Medicine

## 2016-05-18 VITALS — BP 124/72 | HR 69 | Temp 97.8°F | Ht 63.0 in | Wt 169.4 lb

## 2016-05-18 DIAGNOSIS — F419 Anxiety disorder, unspecified: Secondary | ICD-10-CM

## 2016-05-18 DIAGNOSIS — E538 Deficiency of other specified B group vitamins: Secondary | ICD-10-CM | POA: Diagnosis not present

## 2016-05-18 DIAGNOSIS — Z720 Tobacco use: Secondary | ICD-10-CM

## 2016-05-18 DIAGNOSIS — J84112 Idiopathic pulmonary fibrosis: Secondary | ICD-10-CM

## 2016-05-18 DIAGNOSIS — B351 Tinea unguium: Secondary | ICD-10-CM

## 2016-05-18 DIAGNOSIS — K5900 Constipation, unspecified: Secondary | ICD-10-CM

## 2016-05-18 LAB — VITAMIN B12

## 2016-05-18 MED ORDER — POLYETHYLENE GLYCOL 3350 17 GM/SCOOP PO POWD
17.0000 g | Freq: Two times a day (BID) | ORAL | Status: DC | PRN
Start: 2016-05-18 — End: 2016-10-03

## 2016-05-18 MED ORDER — OMEPRAZOLE 20 MG PO CPDR: 20.0000 mg | DELAYED_RELEASE_CAPSULE | Freq: Every day | ORAL | Status: DC

## 2016-05-18 MED ORDER — ALPRAZOLAM 0.5 MG PO TABS: 0.5000 mg | ORAL_TABLET | Freq: Every evening | ORAL | Status: DC | PRN

## 2016-05-18 NOTE — Assessment & Plan Note (Signed)
Recently diagnosed. Has seen pulmonology recently. Breathing is stable. Oxygenation is normal. She'll continue to follow with pulmonology.

## 2016-05-18 NOTE — Assessment & Plan Note (Addendum)
Patient reports long history of constipation. Does report some cramping discomfort in her right lower quadrant with this. Had some mild tenderness overlying the right side of the scar from her prior surgery though no hernia noted. Also has some nausea. Nausea could be related to Chantix or her constipation. Could be related to her narcotic use. Also has had surgery on her stomach so could have some adhesions. No signs of obstruction she had a bowel movement earlier today and has good bowel sounds. Discuss treating her constipation with MiraLAX. Once a day for the next several days and then can increase to twice a day if not having good bowel movements. I did discuss obtaining a CT scan of her abdomen and pelvis given her concern for hernia and the cramping sensation she has and she declined this. Also discussed changing her Chantix to see if that would help with the nausea though she declined. She opted to continue to monitor this. She's given return precautions.

## 2016-05-18 NOTE — Assessment & Plan Note (Signed)
On left great toe. Advised podiatry referral. She declined. She'll continue to monitor.

## 2016-05-18 NOTE — Assessment & Plan Note (Signed)
Has not been seeing a counselor recently. Reports stable on Xanax. No depression. She'll continue Xanax was refilled today. Given return precautions.

## 2016-05-18 NOTE — Assessment & Plan Note (Signed)
Working on quitting smoking. Currently taking Chantix. Discussed given her nausea possibly changing this though she declined. She'll continue this.

## 2016-05-18 NOTE — Patient Instructions (Signed)
Nice to see you. Please start on the MiraLAX to help with your constipation. We will start you on Prilosec as well for your reflux. If you develop any abdominal pain, vomiting, diarrhea, blood in her stool, trouble breathing, or any new or changing symptoms please seek medical attention. You need to follow up with your gynecologist at [facility] for your yearly exam.

## 2016-05-18 NOTE — Progress Notes (Signed)
Pre visit review using our clinic review tool, if applicable. No additional management support is needed unless otherwise documented below in the visit note. 

## 2016-05-18 NOTE — Assessment & Plan Note (Signed)
History of B12 deficiency. Not currently taking any supplements. We'll check a B12 level today.

## 2016-05-18 NOTE — Progress Notes (Signed)
Patient ID: Kelli Morris, female   DOB: [birth year], 65 y.o.   MRN: [id number]  Tommi Rumps, MD Phone: [phone]  Kelli Morris is a 65 y.o. female who presents today for follow-up.  IPF: Patient recently diagnosed with idiopathic pulmonary fibrosis. Is seeing pulmonology for this. She states they believe it is rheumatoid-related. Told her she would likely need a lung transplant. She notes her breathing is stable at this time. Does note it is occasionally worse with high humidity.  She continues to smoke. She is on Chantix. Quit date is later this week. Feels that Chantix is making her nauseated. Has had some reflux with this. No vomiting or diarrhea recently. Was on Nexium along time ago. Is constipated and has a bowel movement once every 10-14 days. Notes there are little balls. One bowel movement this morning. Takes oxycodone. Does note occasional right lower quadrant cramping and feels as though she has to push on this area to make it stop. Wonders if she has a hernia. She is status post hysterectomy and BSO for uterine cancer in her 19s. Does still have her appendix and gallbladder. No blood in her stool.  Anxiety: Related to family events recently having lots of issues with her siblings regarding her father's well. No depression. Takes Xanax 2 times a week and feels this is beneficial.  Reports thickened left big toenail. Notes part of it fell off.  PMH: Smoker   ROS see history of present illness  Objective  Physical Exam Filed Vitals:   05/18/16 0757  BP: 124/72  Pulse: 69  Temp: 97.8 F (36.6 C)    BP Readings from Last 3 Encounters:  05/18/16 124/72  04/29/16 122/78  03/08/16 122/64   Wt Readings from Last 3 Encounters:  05/18/16 169 lb 6.4 oz (76.839 kg)  04/29/16 171 lb (77.565 kg)  03/08/16 167 lb 12.8 oz (76.114 kg)    Physical Exam  Constitutional: She is well-developed, well-nourished, and in no distress.  HENT:  Head: Normocephalic and atraumatic.    Right Ear: External ear normal.  Left Ear: External ear normal.  Cardiovascular: Normal rate, regular rhythm and normal heart sounds.   Pulmonary/Chest: Effort normal and breath sounds normal.  Abdominal: Soft. Bowel sounds are normal. There is no rebound and no guarding.  Nondistended, no hernia palpated, right suprapubic region with minimal discomfort overlying the abdominal scar  Neurological: She is alert. Gait normal.  Skin: Skin is warm and dry. She is not diaphoretic.  Left big toe with thickened toenail half of toenail missing  Psychiatric: Mood and affect normal.     Assessment/Plan: Please see individual problem list.  Tobacco use Working on quitting smoking. Currently taking Chantix. Discussed given her nausea possibly changing this though she declined. She'll continue this.  Idiopathic pulmonary fibrosis ([place]) Recently diagnosed. Has seen pulmonology recently. Breathing is stable. Oxygenation is normal. She'll continue to follow with pulmonology.  Anxiety Has not been seeing a counselor recently. Reports stable on Xanax. No depression. She'll continue Xanax was refilled today. Given return precautions.  Constipation Patient reports long history of constipation. Does report some cramping discomfort in her right lower quadrant with this. Had some mild tenderness overlying the right side of the scar from her prior surgery though no hernia noted. Also has some nausea. Nausea could be related to Chantix or her constipation. Could be related to her narcotic use. Also has had surgery on her stomach so could have some adhesions. No signs of obstruction she had  a bowel movement earlier today and has good bowel sounds. Discuss treating her constipation with MiraLAX. Once a day for the next several days and then can increase to twice a day if not having good bowel movements. I did discuss obtaining a CT scan of her abdomen and pelvis given her concern for hernia and the cramping sensation  she has and she declined this. Also discussed changing her Chantix to see if that would help with the nausea though she declined. She opted to continue to monitor this. She's given return precautions.  Vitamin B 12 deficiency History of B12 deficiency. Not currently taking any supplements. We'll check a B12 level today.  Onychomycosis On left great toe. Advised podiatry referral. She declined. She'll continue to monitor.    Orders Placed This Encounter  Procedures  . B12    Meds ordered this encounter  Medications  . polyethylene glycol powder (GLYCOLAX/MIRALAX) powder    Sig: Take 17 g by mouth 2 (two) times daily as needed.    Dispense:  3350 g    Refill:  1  . omeprazole (PRILOSEC) 20 MG capsule    Sig: Take 1 capsule (20 mg total) by mouth daily.    Dispense:  30 capsule    Refill:  0  . ALPRAZolam (XANAX) 0.5 MG tablet    Sig: Take 1 tablet (0.5 mg total) by mouth at bedtime as needed for sleep.    Dispense:  30 tablet    Refill:  1    # Healthcare maintenance: Patient was advised that she needs to schedule a follow-up with her gynecologist to [facility] for routine exam given her history of cancer.   Tommi Rumps, MD [place]

## 2016-05-23 ENCOUNTER — Other Ambulatory Visit: Payer: Self-pay | Admitting: Surgical

## 2016-05-23 ENCOUNTER — Telehealth: Payer: Self-pay | Admitting: *Deleted

## 2016-05-23 NOTE — Telephone Encounter (Signed)
Patient wanted to Kelli Morris Dr. Caryl Bis that she has not been on a B supplement since Dec.2016.

## 2016-05-23 NOTE — Telephone Encounter (Signed)
I have taken the B 12 off of her medication list

## 2016-05-25 ENCOUNTER — Encounter: Payer: Self-pay | Admitting: Pulmonary Disease

## 2016-05-25 ENCOUNTER — Ambulatory Visit (INDEPENDENT_AMBULATORY_CARE_PROVIDER_SITE_OTHER): Payer: Medicare Other | Admitting: Pulmonary Disease

## 2016-05-25 VITALS — BP 134/70 | HR 70 | Ht 63.5 in | Wt 170.0 lb

## 2016-05-25 DIAGNOSIS — R911 Solitary pulmonary nodule: Secondary | ICD-10-CM

## 2016-05-25 DIAGNOSIS — Z720 Tobacco use: Secondary | ICD-10-CM | POA: Diagnosis not present

## 2016-05-25 DIAGNOSIS — F17200 Nicotine dependence, unspecified, uncomplicated: Secondary | ICD-10-CM

## 2016-05-25 DIAGNOSIS — J84112 Idiopathic pulmonary fibrosis: Secondary | ICD-10-CM | POA: Diagnosis not present

## 2016-05-25 NOTE — Patient Instructions (Signed)
Congratulations on your success @ smoking cessation so far. It is important that you remain vigilant in these efforts Continue Chantix at least through the month of July. I have ordered repeat CT chest to be performed in August to follow up on the lung nodule I have ordered repeat lung function tests to also be performed in August to follow the lung fibrosis Follow up with me after the above tests are performed. At that time, we will discuss whether to start any medical therapies for pulmonary fibrosis

## 2016-05-26 NOTE — Progress Notes (Signed)
PULMONARY OFFICE FOLLOW UP NOTE  Requesting MD/Service: Caryl Bis, MD Date of initial consultation: 03/08/16 Reason for consultation: Pulmonary fibrosis  PT PROFILE: 65 y.o. F smoker referred for evaluation of pulmonary fibrosis  INITIAL HPI (03/08/16):  51 F smoker who initially presented with a "bad cold" in Nov 2016 with persistent symptoms into December when a CXR was performed and she was diagnosed with pneumonia. She was treated with antibiotics and believes that these were beneficial. However, she continues to have persistent cough which is nonproductive. Because of the abnormal CXR, she underwent CT chest with findings c/w IPF and she is referred for further evaluation. She reports persitent NP cough and mild SOB which is not limiting.She has never been told that she had an abnormal CXR in past. She does have a history of arthritis which has been labelled as RA in the past and she was even on MTX briefly years ago. She denies rash or other skin lesions, sicca symptoms, renal disease. She has a history of prior DVT that occurred after LE injury. She continues to smoke less than one PPD  DATA: 01/26/16 HRCT: Extensive peripheral basilar predominant reticulation, ground-glass attenuation and traction bronchiectasis in both lungs, with early honeycombing in the lower lobes. 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 Indeterminate irregular 1.2 cm nodular focus of consolidation in the basilar right lower lobe. This may represent part of the interstitial lung disease, however a true pulmonary nodule cannot be excluded.  04/27/16 PFTs: No obstruction, mild-mod restriction, mod decrease in DLCO 04/27/16 CXR: diffuse interstitial lung disease  SUBJ: No change in respiratory symptoms. Has not smoked since 05/23/16. Using Chantix without adverse effects. Denies CP, fever, purulent sputum, hemoptysis, LE edema and calf tenderness    Filed Vitals:   05/25/16  0851  BP: 134/70  Pulse: 70  Height: 5' 3.5" (1.613 m)  Weight: 170 lb (77.111 kg)  SpO2: 97%    EXAM:  Gen: WDWN, NAD HEENT: WNL Neck: No JVD Lungs: B crackles, no wheezes Cardiovascular: Normal rate, reg rhythm, no murmurs noted Abdomen: Soft, nontender, normal BS Ext: without clubbing. Neuro: normal  DATA:   BMP Latest Ref Rng 02/10/2016 07/31/2015 06/11/2013  Glucose 70 - 99 mg/dL 91 135(H) 91  BUN 6 - 23 mg/dL 20 10 14   Creatinine 0.40 - 1.20 mg/dL 0.96 0.98 0.82  BUN/Creat Ratio 11 - 26 - - 17  Sodium 135 - 145 mEq/L 135 138 135  Potassium 3.5 - 5.1 mEq/L 4.5 3.9 3.2(L)  Chloride 96 - 112 mEq/L 101 106 93(L)  CO2 19 - 32 mEq/L 25 25 32(H)  Calcium 8.4 - 10.5 mg/dL 9.6 8.9 10.4(H)    CBC Latest Ref Rng 02/10/2016 07/31/2015 06/11/2013  WBC 4.0 - 10.5 K/uL 8.4 7.7 8.3  Hemoglobin 12.0 - 15.0 g/dL 13.2 13.3 11.6  Hematocrit 36.0 - 46.0 % 40.1 40.3 34.0  Platelets 150.0 - 400.0 K/uL 312.0 269 333   RF, ANA negative   CXR:  NNF    IMPRESSION:     ICD-9-CM ICD-10-CM   1. UIP (usual interstitial pneumonitis) (HCC) 515 J84.112 Pulmonary function test  2. Solitary pulmonary nodule 793.11 R91.1 CT Chest Wo Contrast     CANCELED: CT CHEST NODULE FOLLOW UP LOW DOSE W/O  3. Smoker 305.1 Z72.0   1) UIP/IPF - not biopsy proven 2) RLL lung nodule - incidentally found on CT chest. The appearance is indeterminate and in a background of underlying fibrosis. Because she is a smoker and  has pulmonary fiborsis, both of which increase her risk of malignancy, this will have to be followed 3) Smoker - abstinent X 3 days 4) history of DVT - fully treated. Mild residual venous insufficiency  PLAN:  1) I congratulated her on her success at quitting so far and counseled re strategies to remain abstinent. Cont Chantix 2) For pulmonary fibrosis, she will need repeat PFTs in 4-6 months after her previous study and then annually 3) For the lung nodule, repeat CT chest and follow up in 8  weeks  Merton Border, MD PCCM service Mobile [phone] Pager [phone] 05/26/2016

## 2016-05-31 ENCOUNTER — Encounter: Payer: Self-pay | Admitting: Family Medicine

## 2016-05-31 ENCOUNTER — Ambulatory Visit (INDEPENDENT_AMBULATORY_CARE_PROVIDER_SITE_OTHER): Payer: Medicare Other | Admitting: Family Medicine

## 2016-05-31 VITALS — BP 112/78 | HR 65 | Temp 97.7°F | Ht 63.5 in | Wt 171.8 lb

## 2016-05-31 DIAGNOSIS — R748 Abnormal levels of other serum enzymes: Secondary | ICD-10-CM | POA: Insufficient documentation

## 2016-05-31 DIAGNOSIS — L0390 Cellulitis, unspecified: Secondary | ICD-10-CM | POA: Insufficient documentation

## 2016-05-31 DIAGNOSIS — R42 Dizziness and giddiness: Secondary | ICD-10-CM

## 2016-05-31 DIAGNOSIS — L03115 Cellulitis of right lower limb: Secondary | ICD-10-CM

## 2016-05-31 LAB — VITAMIN B12

## 2016-05-31 MED ORDER — DOXYCYCLINE HYCLATE 100 MG PO TABS: 100.0000 mg | ORAL_TABLET | Freq: Two times a day (BID) | ORAL | Status: DC

## 2016-05-31 NOTE — Progress Notes (Signed)
Patient ID: Kelli Morris, female   DOB: September 24, 1951, 65 y.o.   MRN: [id number]  Kelli Rumps, MD Phone: [phone]  Kelli Morris is a 65 y.o. female who presents today for same-day visit.  Patient reports over the last week she has had a boil on her right inner upper thigh. Notes that initially drained pus one week ago though since then has been draining serosanguineous material. Notes discomfort in the area and firmness. Note she had some sweats and felt feverish last night. No spreading redness.  Lightheadedness: Patient notes for some time she has had some lightheadedness when standing up from a seated position or from going from a bent over position to a standing position. Does not last long and resolves with sitting. No chest pain, shortness of breath, or palpitations with this. She does drink well. No loss of consciousness.  She was noted to have an elevated B12 recently. She's not been on B12 supplements in 6-7 months. No history of liver dysfunction.  PMH: former smoker   ROS see history of present illness  Objective  Physical Exam Filed Vitals:   05/31/16 1050  BP: 112/78  Pulse: 65  Temp: 97.7 F (36.5 C)    BP Readings from Last 3 Encounters:  05/31/16 112/78  05/25/16 134/70  05/18/16 124/72   Wt Readings from Last 3 Encounters:  05/31/16 171 lb 12.8 oz (77.928 kg)  05/25/16 170 lb (77.111 kg)  05/18/16 169 lb 6.4 oz (76.839 kg)   Laying blood pressure 126/74 pulse 56 Sitting blood pressure 120/78 pulse 64 Standing blood pressure 134/78 pulse 69  Physical Exam  Constitutional: She is well-developed, well-nourished, and in no distress.  HENT:  Head: Normocephalic and atraumatic.  Right Ear: External ear normal.  Left Ear: External ear normal.  Cardiovascular: Normal rate, regular rhythm and normal heart sounds.   Pulmonary/Chest: Effort normal and breath sounds normal.  Musculoskeletal: She exhibits no edema.  Neurological: She is alert. Gait normal.   Skin: She is not diaphoretic.        Assessment/Plan: Please see individual problem list.  Cellulitis Patient with what sounds like an abscess initially that has drained. No signs of abscess at this time. No fluctuance. There is induration and erythema and tenderness likely consistent with a cellulitis at this time. We will start the patient on doxycycline to cover for MRSA given abscess previously. She will continue to monitor. She's given return precautions.  Elevated vitamin B12 level No history of liver dysfunction. Recent CBC without any significant abnormalities. We'll recheck B12.  Lightheadedness History most consistent with orthostasis. Asymptomatic otherwise. Orthostatics negative today. Discussed staying hydrated and rising slowly from seated position. She'll continue to monitor. Given return precautions.    Orders Placed This Encounter  Procedures  . B12    Meds ordered this encounter  Medications  . doxycycline (VIBRA-TABS) 100 MG tablet    Sig: Take 1 tablet (100 mg total) by mouth 2 (two) times daily.    Dispense:  14 tablet    Refill:  0    Kelli Rumps, MD [place]

## 2016-05-31 NOTE — Patient Instructions (Signed)
Nice to see you. We are going to start you on doxycycline for skin infection. We will need to recheck this area in 2-3 days. We will recheck your B12 today. If you develop persistent lightheadedness please let us know. You should try to stay well hydrated. You should rise slowly from a seated position or didn't over position. If you develop fevers, spreading redness, worsening lightheadedness, chest pain, shortness of breath, palpitations, or any new or changing symptoms please seek medical attention.

## 2016-05-31 NOTE — Assessment & Plan Note (Signed)
Patient with what sounds like an abscess initially that has drained. No signs of abscess at this time. No fluctuance. There is induration and erythema and tenderness likely consistent with a cellulitis at this time. We will start the patient on doxycycline to cover for MRSA given abscess previously. She will continue to monitor. She's given return precautions.

## 2016-05-31 NOTE — Progress Notes (Signed)
Pre visit review using our clinic review tool, if applicable. No additional management support is needed unless otherwise documented below in the visit note. 

## 2016-05-31 NOTE — Assessment & Plan Note (Signed)
No history of liver dysfunction. Recent CBC without any significant abnormalities. We'll recheck B12.

## 2016-05-31 NOTE — Assessment & Plan Note (Signed)
History most consistent with orthostasis. Asymptomatic otherwise. Orthostatics negative today. Discussed staying hydrated and rising slowly from seated position. She'll continue to monitor. Given return precautions.

## 2016-06-02 ENCOUNTER — Ambulatory Visit (INDEPENDENT_AMBULATORY_CARE_PROVIDER_SITE_OTHER): Payer: Medicare Other | Admitting: Family Medicine

## 2016-06-02 ENCOUNTER — Encounter: Payer: Self-pay | Admitting: Family Medicine

## 2016-06-02 VITALS — BP 126/72 | HR 62 | Temp 98.2°F | Ht 63.5 in | Wt 173.6 lb

## 2016-06-02 DIAGNOSIS — R748 Abnormal levels of other serum enzymes: Secondary | ICD-10-CM | POA: Diagnosis not present

## 2016-06-02 DIAGNOSIS — L03115 Cellulitis of right lower limb: Secondary | ICD-10-CM | POA: Diagnosis not present

## 2016-06-02 LAB — CBC WITH DIFFERENTIAL/PLATELET
BASOS PCT: 1.1 % (ref 0.0–3.0)
Basophils Absolute: 0.1 10*3/uL (ref 0.0–0.1)
EOS ABS: 0.3 10*3/uL (ref 0.0–0.7)
EOS PCT: 4 % (ref 0.0–5.0)
HEMATOCRIT: 37.3 % (ref 36.0–46.0)
HEMOGLOBIN: 12.5 g/dL (ref 12.0–15.0)
LYMPHS PCT: 30.5 % (ref 12.0–46.0)
Lymphs Abs: 2.1 10*3/uL (ref 0.7–4.0)
MCHC: 33.6 g/dL (ref 30.0–36.0)
MCV: 93.1 fl (ref 78.0–100.0)
Monocytes Absolute: 0.5 10*3/uL (ref 0.1–1.0)
Monocytes Relative: 7.4 % (ref 3.0–12.0)
Neutro Abs: 3.9 10*3/uL (ref 1.4–7.7)
Neutrophils Relative %: 57 % (ref 43.0–77.0)
Platelets: 253 10*3/uL (ref 150.0–400.0)
RBC: 4.01 Mil/uL (ref 3.87–5.11)
RDW: 15.7 % — AB (ref 11.5–15.5)
WBC: 6.9 10*3/uL (ref 4.0–10.5)

## 2016-06-02 NOTE — Assessment & Plan Note (Signed)
Quite a bit improved after several days of antibiotics. She'll complete the course of doxycycline. She'll continue to monitor. She's given return precautions.

## 2016-06-02 NOTE — Progress Notes (Signed)
Pre visit review using our clinic review tool, if applicable. No additional management support is needed unless otherwise documented below in the visit note. 

## 2016-06-02 NOTE — Progress Notes (Signed)
Patient ID: Kelli Morris, female   DOB: 1951/03/25, 65 y.o.   MRN: [id number]  Tommi Rumps, MD Phone: [phone]  Kelli Morris is a 65 y.o. female who presents today for follow-up.  Patient presents for follow-up of cellulitis of right inner thigh. Notes this is much improved. Hurts significantly less. Has stopped draining. No fevers. Minimally red. Feels well. Is taking doxycycline.  B12 is noted to be repeatedly high. Patient reports a family history of leukemia and other cancers. No prior liver workup.  PMH: Former smoker   ROS see history of present illness  Objective  Physical Exam Filed Vitals:   06/02/16 0851  BP: 126/72  Pulse: 62  Temp: 98.2 F (36.8 C)    BP Readings from Last 3 Encounters:  06/02/16 126/72  05/31/16 112/78  05/25/16 134/70   Wt Readings from Last 3 Encounters:  06/02/16 173 lb 9.6 oz (78.744 kg)  05/31/16 171 lb 12.8 oz (77.928 kg)  05/25/16 170 lb (77.111 kg)    Physical Exam  Constitutional: She is well-developed, well-nourished, and in no distress.  HENT:  Head: Normocephalic and atraumatic.  Cardiovascular: Normal rate, regular rhythm and normal heart sounds.   Pulmonary/Chest: Effort normal and breath sounds normal.  Skin: She is not diaphoretic.        Assessment/Plan: Please see individual problem list.  Cellulitis Quite a bit improved after several days of antibiotics. She'll complete the course of doxycycline. She'll continue to monitor. She's given return precautions.  Elevated vitamin B12 level B12 persistently elevated. We will obtain right upper quadrant ultrasound to evaluate her liver and obtain CBC with differential and peripheral smear to workup further.    Orders Placed This Encounter  Procedures  . US Abdomen Limited RUQ    Standing Status: Future     Number of Occurrences:      Standing Expiration Date: 08/02/2017    Order Specific Question:  Reason for Exam (SYMPTOM  OR DIAGNOSIS REQUIRED)   Answer:  elevated B12 with no supplementation    Order Specific Question:  Preferred imaging location?    Answer:  [facility]  . CBC w/Diff  . Pathologist smear review    Tommi Rumps, MD [place]

## 2016-06-02 NOTE — Patient Instructions (Signed)
Nice to see you. I'm glad you're feeling better. We will obtain an ultrasound of your liver and some lab work to evaluate for why your B12 is elevated. If you develop fevers, spreading redness, chills, nausea, vomiting, or any new or changing symptoms please seek medical attention.

## 2016-06-02 NOTE — Assessment & Plan Note (Signed)
B12 persistently elevated. We will obtain right upper quadrant ultrasound to evaluate her liver and obtain CBC with differential and peripheral smear to workup further.

## 2016-06-03 ENCOUNTER — Ambulatory Visit
Admission: RE | Admit: 2016-06-03 | Discharge: 2016-06-03 | Disposition: A | Discharge status: Home / Self Care | Payer: Medicare Other | Source: Ambulatory Visit | Attending: Family Medicine | Admitting: Family Medicine

## 2016-06-03 DIAGNOSIS — R748 Abnormal levels of other serum enzymes: Secondary | ICD-10-CM

## 2016-06-03 LAB — PATHOLOGIST SMEAR REVIEW

## 2016-06-16 ENCOUNTER — Other Ambulatory Visit: Payer: Self-pay | Admitting: Family Medicine

## 2016-06-16 ENCOUNTER — Other Ambulatory Visit: Payer: Self-pay | Admitting: Neurology

## 2016-06-16 MED ORDER — OXYCODONE-ACETAMINOPHEN 5-325 MG PO TABS: 1.0000 | ORAL_TABLET | Freq: Four times a day (QID) | ORAL | Status: DC | PRN

## 2016-06-16 NOTE — Telephone Encounter (Signed)
Patient called to request refill of oxyCODONE-acetaminophen (PERCOCET/ROXICET) 5-325 MG tablet

## 2016-06-16 NOTE — Telephone Encounter (Signed)
Last OV w/ Megan NP 02/01/16 when med was last refilled F/u scheduled 08/01/16

## 2016-06-17 NOTE — Telephone Encounter (Signed)
Rx printed, signed, up front for pick-up. 

## 2016-06-30 ENCOUNTER — Ambulatory Visit (INDEPENDENT_AMBULATORY_CARE_PROVIDER_SITE_OTHER): Payer: Medicare Other | Admitting: Family Medicine

## 2016-06-30 ENCOUNTER — Encounter: Payer: Self-pay | Admitting: Family Medicine

## 2016-06-30 VITALS — BP 128/78 | HR 75 | Temp 98.2°F | Wt 176.6 lb

## 2016-06-30 DIAGNOSIS — R946 Abnormal results of thyroid function studies: Secondary | ICD-10-CM | POA: Diagnosis not present

## 2016-06-30 DIAGNOSIS — R7989 Other specified abnormal findings of blood chemistry: Secondary | ICD-10-CM | POA: Insufficient documentation

## 2016-06-30 DIAGNOSIS — J84112 Idiopathic pulmonary fibrosis: Secondary | ICD-10-CM

## 2016-06-30 DIAGNOSIS — M810 Age-related osteoporosis without current pathological fracture: Secondary | ICD-10-CM | POA: Insufficient documentation

## 2016-06-30 DIAGNOSIS — K219 Gastro-esophageal reflux disease without esophagitis: Secondary | ICD-10-CM

## 2016-06-30 LAB — COMPREHENSIVE METABOLIC PANEL
ALT: 11 U/L (ref 0–35)
AST: 17 U/L (ref 0–37)
Albumin: 4.2 g/dL (ref 3.5–5.2)
Alkaline Phosphatase: 88 U/L (ref 39–117)
BILIRUBIN TOTAL: 0.2 mg/dL (ref 0.2–1.2)
BUN: 10 mg/dL (ref 6–23)
CHLORIDE: 106 meq/L (ref 96–112)
CO2: 28 meq/L (ref 19–32)
CREATININE: 1.1 mg/dL (ref 0.40–1.20)
Calcium: 9.5 mg/dL (ref 8.4–10.5)
GFR: 52.94 mL/min — ABNORMAL LOW (ref >60.00)
GLUCOSE: 83 mg/dL (ref 70–99)
Potassium: 4.3 mEq/L (ref 3.5–5.1)
SODIUM: 140 meq/L (ref 135–145)
Total Protein: 6.8 g/dL (ref 6.0–8.3)

## 2016-06-30 LAB — T3, FREE: T3, Free: 2.6 pg/mL (ref 2.3–4.2)

## 2016-06-30 LAB — T4, FREE: Free T4: 0.6 ng/dL (ref 0.60–1.60)

## 2016-06-30 NOTE — Progress Notes (Signed)
  Tommi Rumps, MD Phone: [phone]  Kelli Morris is a 65 y.o. female who presents today for follow-up.  Idiopathic pulmonary fibrosis: Notes her breathing is stable. Cough is improved and she is not coughing anything up. This is improved since she quit smoking. She follows up with pulmonology for PFTs and a repeat x-ray in the next month. Not currently on medications.  Elevated TSH: She reports a history of hypothyroidism in the past. Was previously on medication though stopped the 7 months ago. She has gained some weight and has dry skin with possible hair falling out. Notes heat and cold intolerance.  Osteoporosis: Recent DEXA scan at [facility] revealed osteoporosis. Takes vitamin D though no calcium. Does not take vitamin D during the summer. Does report a history of reflux for which omeprazole is beneficial. This would preclude her from taking oral bisphosphonates.  GERD: Notes the omeprazole helps. She stopped it for a day and had significant burning and sour taste. Restarted it and the symptoms resolved. No blood in her stool.  PMH: Former smoker   ROS see history of present illness  Objective  Physical Exam Vitals:   06/30/16 0846  BP: 128/78  Pulse: 75  Temp: 98.2 F (36.8 C)    BP Readings from Last 3 Encounters:  06/30/16 128/78  06/02/16 126/72  05/31/16 112/78   Wt Readings from Last 3 Encounters:  06/30/16 176 lb 9.6 oz (80.1 kg)  06/02/16 173 lb 9.6 oz (78.7 kg)  05/31/16 171 lb 12.8 oz (77.9 kg)    Physical Exam  Constitutional: She is well-developed, well-nourished, and in no distress.  HENT:  Head: Normocephalic and atraumatic.  Cardiovascular: Normal rate, regular rhythm and normal heart sounds.   Pulmonary/Chest: Effort normal. No respiratory distress. She has no wheezes.  Minimal crackles bibasilar and mid lung fields  Neurological: She is alert. Gait normal.  Skin: Skin is warm and dry.     Assessment/Plan: Please see individual problem  list.  Idiopathic pulmonary fibrosis ([place]) Following of pulmonology. Breathing is stable. Cough is improved. Oxygen in the normal range. She'll continue to follow with pulmonology. Given return precautions.  Osteoporosis Patient with osteoporosis on recent DEXA scan. Her reflux makes her a poor candidate for oral bisphosphonates. We will provide her with information on Prolia and she will research and let us know if she would like to proceed with this. She will proceed with this we will order it.  Esophageal reflux Patient with persistent reflux. Well controlled on omeprazole. We'll refer to GI for potential EGD given recurrence of symptoms after coming off of omeprazole.  Elevated TSH Mildly elevated on review of care everywhere. We'll check a free T3 and free T4. Likely we'll start her on medication as she is symptomatic though we will wait T3 and T4 results.   Orders Placed This Encounter  Procedures  . Comp Met (CMET)  . T3, free  . T4, free  . Ambulatory referral to Gastroenterology    Referral Priority:   Routine    Referral Type:   Consultation    Referral Reason:   Specialty Services Required    Number of Visits Requested:   1    No orders of the defined types were placed in this encounter.   Tommi Rumps, MD [place]

## 2016-06-30 NOTE — Assessment & Plan Note (Signed)
Following of pulmonology. Breathing is stable. Cough is improved. Oxygen in the normal range. She'll continue to follow with pulmonology. Given return precautions.

## 2016-06-30 NOTE — Assessment & Plan Note (Signed)
Patient with persistent reflux. Well controlled on omeprazole. We'll refer to GI for potential EGD given recurrence of symptoms after coming off of omeprazole.

## 2016-06-30 NOTE — Assessment & Plan Note (Signed)
Patient with osteoporosis on recent DEXA scan. Her reflux makes her a poor candidate for oral bisphosphonates. We will provide her with information on Prolia and she will research and let us know if she would like to proceed with this. She will proceed with this we will order it.

## 2016-06-30 NOTE — Patient Instructions (Signed)
Nice to see you. We will check some lab work today to evaluate your thyroid and kidney using calcium. We'll refer you to GI. Please keep your follow-up with pulmonology. If you develop chest pain, shortness of breath, productive cough, fevers, cough productive of blood, or any new or changing symptoms please seek medical attention.

## 2016-06-30 NOTE — Assessment & Plan Note (Signed)
Mildly elevated on review of care everywhere. We'll check a free T3 and free T4. Likely we'll start her on medication as she is symptomatic though we will wait T3 and T4 results.

## 2016-06-30 NOTE — Progress Notes (Signed)
Pre visit review using our clinic review tool, if applicable. No additional management support is needed unless otherwise documented below in the visit note. 

## 2016-07-08 ENCOUNTER — Other Ambulatory Visit: Payer: Self-pay | Admitting: Family Medicine

## 2016-07-08 MED ORDER — LEVOTHYROXINE SODIUM 25 MCG PO TABS: 25.0000 ug | ORAL_TABLET | Freq: Every day | ORAL | 1 refills | Status: AC

## 2016-07-22 ENCOUNTER — Other Ambulatory Visit: Payer: Self-pay | Admitting: Cardiovascular Disease

## 2016-07-22 ENCOUNTER — Ambulatory Visit: Payer: Medicare Other

## 2016-07-25 ENCOUNTER — Ambulatory Visit: Payer: Medicare Other | Admitting: Pulmonary Disease

## 2016-07-26 ENCOUNTER — Ambulatory Visit: Payer: Medicare Other

## 2016-08-01 ENCOUNTER — Telehealth: Payer: Self-pay

## 2016-08-01 ENCOUNTER — Ambulatory Visit: Payer: Medicare Other | Admitting: Neurology

## 2016-08-01 ENCOUNTER — Telehealth: Payer: Self-pay | Admitting: Neurology

## 2016-08-01 NOTE — Telephone Encounter (Signed)
Pt no-showed scheduled appt this morning.

## 2016-08-01 NOTE — Telephone Encounter (Signed)
This patient did not show for a revisit appointment today. 

## 2016-08-11 ENCOUNTER — Telehealth: Payer: Self-pay | Admitting: *Deleted

## 2016-08-11 NOTE — Telephone Encounter (Signed)
Patient will need a phone from Dr. Caryl Bis or Roselyn Reef for a  medication consult for her osteoporosis medication. Pt contact [phone]

## 2016-08-12 ENCOUNTER — Encounter: Payer: Self-pay | Admitting: Family Medicine

## 2016-08-12 NOTE — Telephone Encounter (Signed)
Spoke with patient about the osteoporosis medication and she would like to go with the prolia. She also stated that she seen GI and they do not want her taking ASA any longer. They suggested that she see her PCP to go on antiinflammatory medication. She also wants to increase her xanax for her road trip. I explained that he will go over all of this on Monday at her appointment.

## 2016-08-12 NOTE — Telephone Encounter (Signed)
We will discuss on Monday.

## 2016-08-12 NOTE — Telephone Encounter (Signed)
It looks like you want her to have prolia from the last note. Can you please place order.

## 2016-08-12 NOTE — Telephone Encounter (Signed)
Can you find out what her question is regarding the medication? Thanks.

## 2016-08-15 ENCOUNTER — Encounter: Payer: Self-pay | Admitting: Family Medicine

## 2016-08-15 ENCOUNTER — Ambulatory Visit (INDEPENDENT_AMBULATORY_CARE_PROVIDER_SITE_OTHER): Payer: Medicare Other | Admitting: Family Medicine

## 2016-08-15 DIAGNOSIS — L03115 Cellulitis of right lower limb: Secondary | ICD-10-CM | POA: Diagnosis not present

## 2016-08-15 DIAGNOSIS — M810 Age-related osteoporosis without current pathological fracture: Secondary | ICD-10-CM

## 2016-08-15 DIAGNOSIS — G894 Chronic pain syndrome: Secondary | ICD-10-CM

## 2016-08-15 DIAGNOSIS — F419 Anxiety disorder, unspecified: Secondary | ICD-10-CM

## 2016-08-15 DIAGNOSIS — M797 Fibromyalgia: Secondary | ICD-10-CM | POA: Insufficient documentation

## 2016-08-15 MED ORDER — SERTRALINE HCL 50 MG PO TABS: 50.0000 mg | ORAL_TABLET | Freq: Every day | ORAL | 3 refills | Status: DC

## 2016-08-15 MED ORDER — ALPRAZOLAM 0.5 MG PO TABS: 0.5000 mg | ORAL_TABLET | Freq: Two times a day (BID) | ORAL | 1 refills | Status: DC | PRN

## 2016-08-15 MED ORDER — DENOSUMAB 60 MG/ML ~~LOC~~ SOLN: 60.0000 mg | Freq: Once | SUBCUTANEOUS | Status: DC

## 2016-08-15 NOTE — Progress Notes (Signed)
Pre visit review using our clinic review tool, if applicable. No additional management support is needed unless otherwise documented below in the visit note. 

## 2016-08-15 NOTE — Assessment & Plan Note (Signed)
Area is resolved with exception of mild darkening of the skin. No signs of infection at this time. Discussed that she would be fine to proceed with Prolia given lack of signs of infection.

## 2016-08-15 NOTE — Patient Instructions (Signed)
Nice to see you. You can take ibuprofen 600 mg every 12 hours as needed for pain. Please do not take any aspirin for pain. You can use your Xanax twice daily as needed for anxiety. We will start you on Zoloft for anxiety and depression as well. Please monitor the area where you had infection previously and if you develop any symptoms in this area please let us know. If you develop a plan or intent to harm yourself, thoughts of harming herself, worsening anxiety or depression, or any new or changing symptoms please seek medical attention.

## 2016-08-15 NOTE — Assessment & Plan Note (Signed)
Worsened recently. Some depression as well. Some thoughts of being better off dead though no thoughts of killing herself or plan or intent to harm herself. We'll start her on Zoloft. Refill her Xanax. Given return precautions.

## 2016-08-15 NOTE — Assessment & Plan Note (Signed)
Patient with chronic pain due to a variety of issues. Currently taking Percocet as needed. Was previously taking an incredibly large amount of aspirin a day. Discussed taking ibuprofen 600 mg every 12 hours as needed in place of this to see if this could be beneficial. Advised she take this with food. If upsets her stomach she will let us know and we'll discontinue this and find an alternative regimen.

## 2016-08-15 NOTE — Assessment & Plan Note (Signed)
We will proceed with Prolia given she is a poor candidate for oral bisphosphonates with her reflux. No signs of infection at this time. Information reviewed in up-to-date and no noted contraindications for use in idiopathic pulmonary fibrosis.

## 2016-08-15 NOTE — Progress Notes (Signed)
Tommi Rumps, MD Phone: [phone]  Kelli Morris is a 65 y.o. female who presents today for follow-up.  Patient presents to talk about a number of things. We spent the first 10-15 minutes of the office visit discussing her concerns regarding recent communication between our office, my CMA, and the patient. Patient noted on one occasion on August 1st she asked for a call back regarding the osteoporosis medicines that we did discussed at her prior visit though it appears that this was not in the documentation of the result note which she refers and based on what is documented they discussed thyroid medicine. She also feels as though the communication was not good last week when she spoke to my CMA regarding medication refills. Per review it appears that the message stated that the patient wanted to increase her dosage of Xanax though the patient reports that she was actually out of the medication. Per discussion with CMA the patient never reported that she was out of the medication and never reported that she needed the refill. I apologized for these issues and noted we will try to work better on her communication.  Patient noted she was concerned regarding the Prolia with regards to her osteoporosis given her recent cellulitis. She notes the area of cellulitis bled a little bit last week though has not drained or been tender or been red or had warmth. She notes it is mostly healed. She does report significant reflux and is going to have an EGD coming up.  Patient reports she was taking 3-4 g a day of aspirin or her neuropathy and scattered pains. She saw GI and they advised her not to do this anymore and that she should consider another anti-inflammatory and other medication for her pain and neuropathy. She takes narcotics rarely as they make her nauseous. She does report neuropathy and has tried Lyrica, Cymbalta, and gabapentin in the past with little benefit.  Anxiety: Patient notes a lot going  on with her recent diagnosis of idiopathic pulmonary hypertension. She notes she's trying to stay strong for her husband and other family members. Notes she feels somewhat depressed. Some thoughts that she would be better off dead though she has no thoughts of killing herself or intent or plan to harm herself. Currently taking Xanax once nightly. She is going on a trip soon and would like to have Xanax twice daily while on her trip as they're going to be in the car for extended periods of time.  PMH: Former smoker   ROS see history of present illness  Objective  Physical Exam Vitals:   08/15/16 1358  BP: 128/78  Pulse: 82  Temp: 97.9 F (36.6 C)    BP Readings from Last 3 Encounters:  08/15/16 128/78  06/30/16 128/78  06/02/16 126/72   Wt Readings from Last 3 Encounters:  08/15/16 182 lb (82.6 kg)  06/30/16 176 lb 9.6 oz (80.1 kg)  06/02/16 173 lb 9.6 oz (78.7 kg)    Physical Exam  Constitutional: No distress.  Cardiovascular: Normal rate, regular rhythm and normal heart sounds.   Pulmonary/Chest: Effort normal. No respiratory distress.  Mild bibasilar crackles improved from previously  Musculoskeletal: She exhibits no edema.  Neurological: She is alert. Gait normal.  Skin: Skin is warm and dry. She is not diaphoretic.        Assessment/Plan: Please see individual problem list.  Cellulitis Area is resolved with exception of mild darkening of the skin. No signs of infection at this time.  Discussed that she would be fine to proceed with Prolia given lack of signs of infection.  Osteoporosis We will proceed with Prolia given she is a poor candidate for oral bisphosphonates with her reflux. No signs of infection at this time. Information reviewed in up-to-date and no noted contraindications for use in idiopathic pulmonary fibrosis.  Anxiety Worsened recently. Some depression as well. Some thoughts of being better off dead though no thoughts of killing herself or plan or  intent to harm herself. We'll start her on Zoloft. Refill her Xanax. Given return precautions.  Chronic pain syndrome Patient with chronic pain due to a variety of issues. Currently taking Percocet as needed. Was previously taking an incredibly large amount of aspirin a day. Discussed taking ibuprofen 600 mg every 12 hours as needed in place of this to see if this could be beneficial. Advised she take this with food. If upsets her stomach she will let us know and we'll discontinue this and find an alternative regimen.   No orders of the defined types were placed in this encounter.   Meds ordered this encounter  Medications  . sertraline (ZOLOFT) 50 MG tablet    Sig: Take 1 tablet (50 mg total) by mouth daily.    Dispense:  30 tablet    Refill:  3  . ALPRAZolam (XANAX) 0.5 MG tablet    Sig: Take 1 tablet (0.5 mg total) by mouth 2 (two) times daily as needed for sleep.    Dispense:  60 tablet    Refill:  1  . denosumab (PROLIA) injection 60 mg    Tommi Rumps, MD [place]

## 2016-08-16 ENCOUNTER — Ambulatory Visit
Admission: RE | Admit: 2016-08-16 | Discharge: 2016-08-16 | Disposition: A | Discharge status: Home / Self Care | Payer: Medicare Other | Source: Ambulatory Visit | Attending: Pulmonary Disease | Admitting: Pulmonary Disease

## 2016-08-16 DIAGNOSIS — R911 Solitary pulmonary nodule: Secondary | ICD-10-CM | POA: Insufficient documentation

## 2016-08-19 ENCOUNTER — Ambulatory Visit (INDEPENDENT_AMBULATORY_CARE_PROVIDER_SITE_OTHER): Payer: Medicare Other | Admitting: *Deleted

## 2016-08-19 DIAGNOSIS — J84112 Idiopathic pulmonary fibrosis: Secondary | ICD-10-CM

## 2016-08-19 LAB — PULMONARY FUNCTION TEST
DL/VA % PRED: 75 %
DL/VA: 3.55 ml/min/mmHg/L
DLCO UNC % PRED: 45 %
DLCO UNC: 10.63 ml/min/mmHg
FEF 25-75 POST: 2.98 L/s
FEF 25-75 PRE: 3.18 L/s
FEF2575-%Change-Post: -6 %
FEF2575-%PRED-POST: 143 %
FEF2575-%PRED-PRE: 153 %
FEV1-%CHANGE-POST: -2 %
FEV1-%PRED-PRE: 74 %
FEV1-%Pred-Post: 72 %
FEV1-Post: 1.71 L
FEV1-Pre: 1.75 L
FEV1FVC-%Change-Post: 1 %
FEV1FVC-%PRED-PRE: 118 %
FEV6-%CHANGE-POST: -3 %
FEV6-%PRED-POST: 62 %
FEV6-%PRED-PRE: 64 %
FEV6-POST: 1.85 L
FEV6-Pre: 1.91 L
FEV6FVC-%PRED-POST: 104 %
FEV6FVC-%Pred-Pre: 104 %
FVC-%Change-Post: -3 %
FVC-%PRED-PRE: 61 %
FVC-%Pred-Post: 59 %
FVC-POST: 1.85 L
FVC-PRE: 1.91 L
POST FEV6/FVC RATIO: 100 %
PRE FEV1/FVC RATIO: 91 %
Post FEV1/FVC ratio: 93 %
Pre FEV6/FVC Ratio: 100 %
RV % pred: 69 %
RV: 1.44 L
TLC % PRED: 67 %
TLC: 3.38 L

## 2016-08-19 NOTE — Progress Notes (Signed)
PFT performed today. 

## 2016-08-23 ENCOUNTER — Other Ambulatory Visit
Admission: RE | Admit: 2016-08-23 | Discharge: 2016-08-23 | Disposition: A | Discharge status: Home / Self Care | Payer: Medicare Other | Source: Ambulatory Visit | Attending: Pulmonary Disease | Admitting: Pulmonary Disease

## 2016-08-23 ENCOUNTER — Other Ambulatory Visit: Payer: Self-pay | Admitting: Pulmonary Disease

## 2016-08-23 ENCOUNTER — Ambulatory Visit (INDEPENDENT_AMBULATORY_CARE_PROVIDER_SITE_OTHER): Payer: Medicare Other | Admitting: Pulmonary Disease

## 2016-08-23 ENCOUNTER — Encounter: Payer: Self-pay | Admitting: Pulmonary Disease

## 2016-08-23 ENCOUNTER — Ambulatory Visit: Payer: Medicare Other

## 2016-08-23 VITALS — BP 130/88 | HR 75 | Wt 184.0 lb

## 2016-08-23 DIAGNOSIS — F17200 Nicotine dependence, unspecified, uncomplicated: Secondary | ICD-10-CM

## 2016-08-23 DIAGNOSIS — R911 Solitary pulmonary nodule: Secondary | ICD-10-CM

## 2016-08-23 DIAGNOSIS — Z86718 Personal history of other venous thrombosis and embolism: Secondary | ICD-10-CM

## 2016-08-23 DIAGNOSIS — Z720 Tobacco use: Secondary | ICD-10-CM

## 2016-08-23 DIAGNOSIS — J84112 Idiopathic pulmonary fibrosis: Secondary | ICD-10-CM

## 2016-08-23 DIAGNOSIS — M79661 Pain in right lower leg: Secondary | ICD-10-CM

## 2016-08-23 LAB — HEPATIC FUNCTION PANEL
ALT: 24 U/L (ref 14–54)
AST: 30 U/L (ref 15–41)
Albumin: 3.9 g/dL (ref 3.5–5.0)
Alkaline Phosphatase: 79 U/L (ref 38–126)
Bilirubin, Direct: <0.1 mg/dL — ABNORMAL LOW (ref 0.1–0.5)
Total Bilirubin: 0.4 mg/dL (ref 0.3–1.2)
Total Protein: 7.3 g/dL (ref 6.5–8.1)

## 2016-08-23 MED ORDER — NINTEDANIB ESYLATE 100 MG PO CAPS: 100.0000 mg | ORAL_CAPSULE | Freq: Two times a day (BID) | ORAL | 5 refills | Status: DC

## 2016-08-23 NOTE — Patient Instructions (Signed)
1) RLE venous US today 2) LFTs today 3) Ofev ordered - 100 mg BID 4) Lung transplant referral 5) ROV 4 weeks - will need repeat LFTs monthly for three months, then every 3 months thereafter

## 2016-08-23 NOTE — Progress Notes (Signed)
PULMONARY OFFICE FOLLOW UP NOTE  Requesting MD/Service: Caryl Bis, MD Date of initial consultation: 03/08/16 Reason for consultation: Pulmonary fibrosis  PT PROFILE: 65 y.o. F smoker referred 03/08/16 by Dr Caryl Bis for evaluation of pulmonary fibrosis. She initially presented with a "bad cold" in Nov 2016 with persistent symptoms into December when a CXR was performed and she was diagnosed with pneumonia. She was treated with antibiotics with improvement. However, she continued to have persistent NP cough. Because of the abnormal CXR, she underwent CT chest with findings c/w IPF She has a history of arthritis which had been labelled as RA in the past and she was even on MTX briefly years ago. She has no rash or other skin lesions, sicca symptoms, renal disease. She has a history of prior DVT that occurred after LE injury.   DATA: 07/31/15: Right peroneal vein suspicious for intraluminal thrombus, although not well demonstrated. Otherwise, no other right lower extremity DVT. 01/14/16: No DVT on LE venous US 01/26/16 HRCT: Extensive peripheral basilar predominant reticulation, ground-glass attenuation and traction bronchiectasis in both lungs, with early honeycombing in the lower lobes. 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 Indeterminate irregular 1.2 cm nodular focus of consolidation in the basilar right lower lobe. This may represent part of the interstitial lung disease, however a true pulmonary nodule cannot be excluded.  03/08/16: Rheumatoid factor and ANA negative 04/27/16 PFTs: No obstruction, mild-mod restriction, mod decrease in DLCO 04/27/16 CXR: diffuse interstitial lung disease 08/16/16 CT chest: Right lower lobe pulmonary nodule measures slightly smaller although this difference may be technical only. Recommend an additional follow-up chest CT in 6 months to document longer term stability. Stable changes of presumed usual interstitial  pneumonia No acute findings. 08/19/16 PFTs: No obstruction, mild-mod restriction, mod decrease in DLCO - NSC from prior study  SUBJ: No significant change in respiratory symptoms. Has not smoked since 05/23/16. No longer on Chantix (quit @ end of July). Reports increased RLE edema over past 6 months. Denies CP, fever, purulent sputum, hemoptysis, LE edema and calf tenderness   Vitals:   08/23/16 1102  BP: 130/88  Pulse: 75  SpO2: 95%  Weight: 184 lb (83.5 kg)    EXAM:  Gen: WDWN, NAD HEENT: WNL Neck: No JVD Lungs: B crackles, no wheezes Cardiovascular: Normal rate, reg rhythm, no murmurs noted Abdomen: Soft, nontender, normal BS Ext: without clubbing. Severe R pedal, ankle and pretibial edema - brawny Neuro: intact on limited exam  DATA:   BMP Latest Ref Rng & Units 06/30/2016 02/10/2016 07/31/2015  Glucose 70 - 99 mg/dL 83 91 135(H)  BUN 6 - 23 mg/dL 10 20 10   Creatinine 0.40 - 1.20 mg/dL 1.10 0.96 0.98  BUN/Creat Ratio 11 - 26 - - -  Sodium 135 - 145 mEq/L 140 135 138  Potassium 3.5 - 5.1 mEq/L 4.3 4.5 3.9  Chloride 96 - 112 mEq/L 106 101 106  CO2 19 - 32 mEq/L 28 25 25   Calcium 8.4 - 10.5 mg/dL 9.5 9.6 8.9    CBC Latest Ref Rng & Units 06/02/2016 02/10/2016 07/31/2015  WBC 4.0 - 10.5 K/uL 6.9 8.4 7.7  Hemoglobin 12.0 - 15.0 g/dL 12.5 13.2 13.3  Hematocrit 36.0 - 46.0 % 37.3 40.1 40.3  Platelets 150.0 - 400.0 K/uL 253.0 312.0 269   CXR:  NNF  CT chest and PFTs as above  IMPRESSION:     ICD-9-CM ICD-10-CM   1. Smoker 305.1 Z72.0   2. IPF (idiopathic pulmonary fibrosis) (HCC) 516.31  [id number] Hepatic function panel     Ambulatory referral to Pulmonology  3. Lung nodule 793.11 R91.1   4. History of DVT (deep vein thrombosis) V12.51 Z86.718 Ultrasound doppler venous legs bilat   1) UIP/IPF - presumptive diagnosis based on characteristic findings on CT chest. Stable by CT chest and PFTs 2) RLL lung nodule - incidentally found on CT chest. Stable over 6 months of  surveillance 3) Smoker - abstinent since 05/2016 4) Increased RLE edema with prior history of DVT - concern for recurrence vs venous insufficiency  PLAN:  1) RLE venous US today 2) LFTs today 3) Ofev ordered - 100 mg BID 4) Lung transplant referral 5) ROV 4 weeks - will need repeat LFTs monthly for three months, then every 3 months thereafter  Merton Border, MD PCCM service Mobile [phone] Pager [phone] 08/23/2016

## 2016-08-25 ENCOUNTER — Telehealth: Payer: Self-pay | Admitting: Pulmonary Disease

## 2016-08-25 NOTE — Telephone Encounter (Signed)
Ofev approved until 12/04/2016. Pt ID: [id number]  [employer] [facility] [id number].  Pharmacy informed. [place], [place] [phone] (Phone) [phone] (Fax)     Pt informed. Nothing further needed.

## 2016-08-25 NOTE — Telephone Encounter (Signed)
Received a request from [employer] on [facility] website requesting PA for [place].  We do not do OFEV PA's over CMM.    Sending message to [facility] - Dr. Alva Garnet nurse for follow up

## 2016-08-25 NOTE — Telephone Encounter (Signed)
Will follow up on Ofev PA.

## 2016-09-06 ENCOUNTER — Ambulatory Visit: Payer: Medicare Other

## 2016-09-07 ENCOUNTER — Other Ambulatory Visit: Payer: Self-pay | Admitting: Neurology

## 2016-09-07 NOTE — Telephone Encounter (Signed)
Pt's last visit was in February. She called and cancelled her August as well as her October appt d/t being out of town. She did r/s an appt in November. Last rx was given 06/16/16.

## 2016-09-07 NOTE — Telephone Encounter (Signed)
Patient requesting refill of oxyCODONE-acetaminophen (PERCOCET/ROXICET) 5-325 MG tablet Pharmacy: pick up

## 2016-09-08 ENCOUNTER — Ambulatory Visit (INDEPENDENT_AMBULATORY_CARE_PROVIDER_SITE_OTHER): Payer: Medicare Other

## 2016-09-08 DIAGNOSIS — M810 Age-related osteoporosis without current pathological fracture: Secondary | ICD-10-CM

## 2016-09-08 MED ORDER — OXYCODONE-ACETAMINOPHEN 5-325 MG PO TABS: 1.0000 | ORAL_TABLET | Freq: Four times a day (QID) | ORAL | 0 refills | Status: DC | PRN

## 2016-09-08 MED ORDER — DENOSUMAB 60 MG/ML ~~LOC~~ SOLN
60.0000 mg | Freq: Once | SUBCUTANEOUS | Status: AC
  Administered 2016-09-08: 60 mg via SUBCUTANEOUS

## 2016-09-08 NOTE — Telephone Encounter (Signed)
Rx printed, signed, up front for pick-up. 

## 2016-09-08 NOTE — Progress Notes (Signed)
Patient came in for Prolia injection.  Received in   [place].  Patient tolerated well.

## 2016-09-14 ENCOUNTER — Ambulatory Visit: Payer: Medicare Other | Admitting: Neurology

## 2016-09-23 ENCOUNTER — Ambulatory Visit: Payer: Medicare Other | Admitting: Pulmonary Disease

## 2016-09-23 ENCOUNTER — Ambulatory Visit: Payer: Medicare Other | Admitting: Cardiovascular Disease

## 2016-09-30 ENCOUNTER — Encounter: Payer: Self-pay | Admitting: Family Medicine

## 2016-09-30 ENCOUNTER — Ambulatory Visit (INDEPENDENT_AMBULATORY_CARE_PROVIDER_SITE_OTHER): Payer: Medicare Other | Admitting: Family Medicine

## 2016-09-30 VITALS — BP 136/84 | HR 71 | Temp 97.7°F | Wt 181.8 lb

## 2016-09-30 DIAGNOSIS — E669 Obesity, unspecified: Secondary | ICD-10-CM | POA: Insufficient documentation

## 2016-09-30 DIAGNOSIS — E213 Hyperparathyroidism, unspecified: Secondary | ICD-10-CM | POA: Insufficient documentation

## 2016-09-30 DIAGNOSIS — F419 Anxiety disorder, unspecified: Secondary | ICD-10-CM | POA: Diagnosis not present

## 2016-09-30 DIAGNOSIS — E6609 Other obesity due to excess calories: Secondary | ICD-10-CM

## 2016-09-30 DIAGNOSIS — Z6831 Body mass index (BMI) 31.0-31.9, adult: Secondary | ICD-10-CM

## 2016-09-30 DIAGNOSIS — Z23 Encounter for immunization: Secondary | ICD-10-CM

## 2016-09-30 DIAGNOSIS — J84112 Idiopathic pulmonary fibrosis: Secondary | ICD-10-CM

## 2016-09-30 NOTE — Patient Instructions (Signed)
Ice to see you. Please discontinue all her Xanax. You do not need to taper off of this as you you do rarely take it. Please see how he is sleeping without any medication. We are going to refer you to the therapist as well. Please work on diet and exercise as we discussed.

## 2016-09-30 NOTE — Assessment & Plan Note (Signed)
She has elevated PTH on lab testing. Calcium normal. She will await discussion with the transplant team at [facility]. If no plan through them we'll refer to endocrinology.

## 2016-09-30 NOTE — Assessment & Plan Note (Signed)
She met with the transplant team at [facility]. She'll continue to follow with them. She is going to wait to hear from them later this week. Continue to monitor breathing and proceed with pulmonary rehabilitation.

## 2016-09-30 NOTE — Progress Notes (Signed)
Pre visit review using our clinic review tool, if applicable. No additional management support is needed unless otherwise documented below in the visit note. 

## 2016-09-30 NOTE — Assessment & Plan Note (Signed)
Discussed diet and exercise. She needs to lose about 25 pounds per review of the transplant note.

## 2016-09-30 NOTE — Assessment & Plan Note (Signed)
Seems to be better controlled on Zoloft. Rarely taking Xanax. I discussed discontinuing this altogether given the transplant team's recommendations. She only takes it 1-3 times a week and I feel she'll be able to discontinue it immediately. She'll monitor her sleep with this. We will refer her to a therapist. We will continue Zoloft.

## 2016-09-30 NOTE — Progress Notes (Signed)
Tommi Rumps, MD Phone: [phone]  Kelli Morris is a 65 y.o. female who presents today for f/u.  Anxiety: Patient reports not having much anxiety now. She met with the transplant doctors for her IPF. They recommended she come off her Xanax which she takes 1-3 times a week for sleep. Notes there are nights where she just can't fall asleep. The other night she sleeps fine. Has been taking Zoloft. They also recommended her see a therapist. She notes no depression.  IPF: Patient notes she is breathing a little bit worse than before. No cough. No shortness of breath at this time. She reports she did well in the 6 minute walk test with her oxygen only dropping this 97%. She reports the transplant team at [facility] is meeting this week and next week and then they will let her know what the plan is moving forward.  Obesity: They want her down 25 pounds per their note. She's going to decrease sodium intake. She's going to increase water intake and unsweet tea intake. She is going to eat more nutritious snack and start exercising with pulmonary rehabilitation 3 days a week and then exercising 2 days week by walking.  On review of her labs through [facility] and appears that her parathyroid level was elevated. Her calcium was normal. She's not heard from them regarding these labs yet.  PMH: Former smoker   ROS see history of present illness  Objective  Physical Exam Vitals:   09/30/16 0800  BP: 136/84  Pulse: 71  Temp: 97.7 F (36.5 C)    BP Readings from Last 3 Encounters:  09/30/16 136/84  08/23/16 130/88  08/15/16 128/78   Wt Readings from Last 3 Encounters:  09/30/16 181 lb 12.8 oz (82.5 kg)  08/23/16 184 lb (83.5 kg)  08/15/16 182 lb (82.6 kg)    Physical Exam  Constitutional: She is well-developed, well-nourished, and in no distress.  HENT:  Head: Normocephalic and atraumatic.  Cardiovascular: Normal rate, regular rhythm and normal heart sounds.   Pulmonary/Chest: Effort normal  and breath sounds normal.  Musculoskeletal: She exhibits no edema.  Neurological: She is alert. Gait normal.  Skin: Skin is warm and dry.     Assessment/Plan: Please see individual problem list.  Idiopathic pulmonary fibrosis ([place]) She met with the transplant team at [facility]. She'll continue to follow with them. She is going to wait to hear from them later this week. Continue to monitor breathing and proceed with pulmonary rehabilitation.  Anxiety Seems to be better controlled on Zoloft. Rarely taking Xanax. I discussed discontinuing this altogether given the transplant team's recommendations. She only takes it 1-3 times a week and I feel she'll be able to discontinue it immediately. She'll monitor her sleep with this. We will refer her to a therapist. We will continue Zoloft.  Obesity Discussed diet and exercise. She needs to lose about 25 pounds per review of the transplant note.  Hyperparathyroidism ([place]) She has elevated PTH on lab testing. Calcium normal. She will await discussion with the transplant team at [facility]. If no plan through them we'll refer to endocrinology.   Orders Placed This Encounter  Procedures  . Flu vaccine HIGH DOSE PF  . Pneumococcal conjugate vaccine 13-valent  . Ambulatory referral to Psychology    Referral Priority:   Routine    Referral Type:   Psychiatric    Referral Reason:   Specialty Services Required    Requested Specialty:   Psychology    Number of Visits Requested:  [place], MD [place]

## 2016-10-03 ENCOUNTER — Encounter: Payer: Self-pay | Admitting: Cardiovascular Disease

## 2016-10-03 ENCOUNTER — Other Ambulatory Visit: Payer: Self-pay

## 2016-10-03 ENCOUNTER — Ambulatory Visit (INDEPENDENT_AMBULATORY_CARE_PROVIDER_SITE_OTHER): Payer: Medicare Other | Admitting: Cardiovascular Disease

## 2016-10-03 VITALS — BP 134/76 | HR 60 | Ht 63.5 in | Wt 181.5 lb

## 2016-10-03 DIAGNOSIS — I739 Peripheral vascular disease, unspecified: Secondary | ICD-10-CM | POA: Diagnosis not present

## 2016-10-03 DIAGNOSIS — R0602 Shortness of breath: Secondary | ICD-10-CM

## 2016-10-03 DIAGNOSIS — E7800 Pure hypercholesterolemia, unspecified: Secondary | ICD-10-CM

## 2016-10-03 MED ORDER — ATORVASTATIN CALCIUM 40 MG PO TABS: ORAL_TABLET | ORAL | 11 refills | Status: AC

## 2016-10-03 NOTE — Progress Notes (Signed)
Cardiology Office Note   Date:  10/03/2016   ID:  Kelli Morris, DOB 11/24/1951, MRN [id number]  PCP:  Tommi Rumps, MD  Cardiologist:   Kathlyn Sacramento, MD   Chief Complaint  Patient presents with  . other    6 month f/u no complaints today. Reviewed med list with patient verbally.      History of Present Illness: Kelli Morris is a 65 y.o. female who presents for a followup visit regarding chronic venous insufficiency and peripheral arterial disease.  She has known history of fibromyalgia and chronic fatigue. She has prolonged history of tobacco use and family history of coronary artery disease. She reports having Lyme disease which required prolonged treatment with antibiotics.  Nuclear stress test in 06/2013 showed no evidence of ischemia. Echocardiogram was also unremarkable with normal LV systolic and only grade 1 diastolic dysfunction with no significant pulmonary hypertension.  ABI  was abnormal at 0.78 on the right side and 0.87 on the left side. Duplex ultrasound showed severe stenosis in the right distal SFA and mild diffuse disease in the left SFA.  The patient has significant back discomfort related to spinal stenosis with significant numbness. She has been treated medically for peripheral arterial disease.  She had a small right leg DVT in the peroneal vein in 07/2015. She was treated with anticoagulation.  She is known to have coronary calcifications noted on CT scanning. She was diagnosed with pulmonary fibrosis and currently being evaluated for lung transplant at [facility].  Past Medical History:  Diagnosis Date  . Arrhythmia    afib  . Cancer ([place])    uterine  . Fibromyalgia    Chronic fatigue  . GERD (gastroesophageal reflux disease)   . Headache(784.0)   . History of headache   . History of low back pain   . Hyperlipidemia   . Hypertension   . Hypothyroidism   . Idiopathic pulmonary fibrosis ([place])   . Lyme disease   . PVD (peripheral vascular disease)  ([place])   . Strain, cervical   . Thrombosis   . Tobacco use     Past Surgical History:  Procedure Laterality Date  . ABDOMINAL HYSTERECTOMY    . BREAST BIOPSY Left 1990  . LUMBAR SPINE SURGERY    . OVARY SURGERY    . TENDON REPAIR Right    Hand  . TONSILLECTOMY    . TUBAL LIGATION Bilateral   . ULNAR NERVE TRANSPOSITION Left   . VAGINA SURGERY     biopsy  . VULVECTOMY PARTIAL       Current Outpatient Prescriptions  Medication Sig Dispense Refill  . ALPRAZolam (XANAX) 0.5 MG tablet Take 0.5 mg by mouth at bedtime as needed for anxiety.    . Aspirin-Caffeine (BC FAST PAIN RELIEF ARTHRITIS) 1000-65 MG PACK Take 800 mg by mouth 4 (four) times daily.     Marland Kitchen atorvastatin (LIPITOR) 40 MG tablet TAKE 1 TABLET (40 MG TOTAL) BY MOUTH DAILY. 30 tablet 3  . levothyroxine (SYNTHROID, LEVOTHROID) 25 MCG tablet Take 1 tablet (25 mcg total) by mouth daily before breakfast. 90 tablet 1  . Nintedanib (OFEV) 100 MG CAPS Take 100 mg by mouth 2 (two) times daily. 60 capsule 5  . oxyCODONE-acetaminophen (PERCOCET/ROXICET) 5-325 MG tablet Take 1 tablet by mouth every 6 (six) hours as needed. 30 tablet 0  . pantoprazole (PROTONIX) 20 MG tablet Take 20 mg by mouth daily.    . sertraline (ZOLOFT) 50 MG tablet Take 1 tablet (  50 mg total) by mouth daily. 30 tablet 3   Current Facility-Administered Medications  Medication Dose Route Frequency Provider Last Rate Last Dose  . denosumab (PROLIA) injection 60 mg  60 mg Subcutaneous Once Leone Haven, MD        Allergies:   Review of patient's allergies indicates no known allergies.    Social History:  The patient  reports that she has quit smoking. Her smoking use included Cigarettes. She has a 22.50 pack-year smoking history. She has never used smokeless tobacco. She reports that she drinks alcohol. She reports that she does not use drugs.   Family History:  The patient's family history includes Arthritis in her brother; COPD in her mother; Congestive  Heart Failure in her father and mother; Diabetes in her father; Heart disease in her maternal grandfather and paternal grandfather; Leukemia in her mother; Neuropathy in her brother; Uterine cancer in her mother.    ROS:  Please see the history of present illness.   Otherwise, review of systems are positive for none.   All other systems are reviewed and negative.    PHYSICAL EXAM: VS:  BP 134/76 (BP Location: Left Arm, Patient Position: Sitting, Cuff Size: Normal)   Pulse 60   Ht 5' 3.5" (1.613 m)   Wt 181 lb 8 oz (82.3 kg)   BMI 31.65 kg/m  , BMI Body mass index is 31.65 kg/m. GEN: Well nourished, well developed, in no acute distress  HEENT: normal  Neck: no JVD, carotid bruits, or masses Cardiac: RRR; no murmurs, rubs, or gallops,no edema  Respiratory:  clear to auscultation bilaterally, normal work of breathing GI: soft, nontender, nondistended, + BS MS: no deformity or atrophy  Skin: warm and dry, no rash Neuro:  Strength and sensation are intact Psych: euthymic mood, full affect   EKG:  EKG is ordered today. The ekg ordered today demonstrates  normal sinus rhythm with no significant ST or T wave changes.   Recent Labs: 02/10/2016: TSH 3.54 06/02/2016: Hemoglobin 12.5; Platelets 253.0 06/30/2016: BUN 10; Creatinine, Ser 1.10; Potassium 4.3; Sodium 140 08/23/2016: ALT 24    Lipid Panel    Component Value Date/Time   CHOL 172 02/04/2016 0809   TRIG 62 02/04/2016 0809   HDL 45 02/04/2016 0809   CHOLHDL 3.8 02/04/2016 0809   LDLCALC 115 (H) 02/04/2016 0809      Wt Readings from Last 3 Encounters:  10/03/16 181 lb 8 oz (82.3 kg)  09/30/16 181 lb 12.8 oz (82.5 kg)  08/23/16 184 lb (83.5 kg)     No flowsheet data found.    ASSESSMENT AND PLAN:  1.  Peripheral arterial disease: She is stable with no claudication. Continue medical therapy.  2. Coronary atherosclerosis: She has no anginal symptoms. Previous stress test was normal 2014.  3. Hyperlipidemia: Continue  treatment with atorvastatin. I reviewed her recent lipid profile done at [facility] which showed an LDL of 85.  4. Tobacco use: I congratulated her on smoking cessation.  Disposition:   FU with me in 1 year  Signed,  Kathlyn Sacramento, MD  10/03/2016 1:56 PM    [place]

## 2016-10-03 NOTE — Patient Instructions (Signed)
Medication Instructions: Continue same medications.   Labwork: None.   Procedures/Testing: None.   Follow-Up: 1 year with Dr. Arida.   Any Additional Special Instructions Will Be Listed Below (If Applicable).     If you need a refill on your cardiac medications before your next appointment, please call your pharmacy.   

## 2016-10-04 ENCOUNTER — Encounter: Payer: Medicare Other | Attending: Pulmonary Disease | Admitting: Respiratory Therapy

## 2016-10-04 VITALS — Ht 63.5 in | Wt 180.8 lb

## 2016-10-04 DIAGNOSIS — J8410 Pulmonary fibrosis, unspecified: Secondary | ICD-10-CM

## 2016-10-04 DIAGNOSIS — J84112 Idiopathic pulmonary fibrosis: Secondary | ICD-10-CM | POA: Insufficient documentation

## 2016-10-04 NOTE — Progress Notes (Signed)
Pulmonary Individual Treatment Plan  Patient Details  Name: TEASA JESS MRN: [id number] Date of Birth: 1951/10/13 Referring Provider:   Flowsheet Row Pulmonary Rehab from 10/04/2016 in [facility] Cardiac and Pulmonary Rehab  Referring Provider  Walker      Initial Encounter Date:  Flowsheet Row Pulmonary Rehab from 10/04/2016 in [facility] Cardiac and Pulmonary Rehab  Date  10/04/16  Referring Provider  Simonds      Visit Diagnosis: Pulmonary fibrosis ([place])  Patient's Home Medications on Admission:  Current Outpatient Prescriptions:    ALPRAZolam (XANAX) 0.5 MG tablet, Take 0.5 mg by mouth at bedtime as needed for anxiety., Disp: , Rfl:    Aspirin-Caffeine (BC FAST PAIN RELIEF ARTHRITIS) 1000-65 MG PACK, Take 800 mg by mouth 4 (four) times daily. , Disp: , Rfl:    atorvastatin (LIPITOR) 40 MG tablet, TAKE 1 TABLET (40 MG TOTAL) BY MOUTH DAILY., Disp: 30 tablet, Rfl: 11   levothyroxine (SYNTHROID, LEVOTHROID) 25 MCG tablet, Take 1 tablet (25 mcg total) by mouth daily before breakfast., Disp: 90 tablet, Rfl: 1   Nintedanib (OFEV) 100 MG CAPS, Take 100 mg by mouth 2 (two) times daily., Disp: 60 capsule, Rfl: 5   oxyCODONE-acetaminophen (PERCOCET/ROXICET) 5-325 MG tablet, Take 1 tablet by mouth every 6 (six) hours as needed., Disp: 30 tablet, Rfl: 0   pantoprazole (PROTONIX) 20 MG tablet, Take 20 mg by mouth daily., Disp: , Rfl:    sertraline (ZOLOFT) 50 MG tablet, Take 1 tablet (50 mg total) by mouth daily., Disp: 30 tablet, Rfl: 3  Current Facility-Administered Medications:    denosumab (PROLIA) injection 60 mg, 60 mg, Subcutaneous, Once, Leone Haven, MD  Past Medical History: Past Medical History:  Diagnosis Date   Arrhythmia    afib   Cancer ([place])    uterine   Fibromyalgia    Chronic fatigue   GERD (gastroesophageal reflux disease)    Headache(784.0)    History of headache    History of low back pain    Hyperlipidemia    Hypertension    Hypothyroidism     Idiopathic pulmonary fibrosis (HCC)    Lyme disease    PVD (peripheral vascular disease) ([place])    Strain, cervical    Thrombosis    Tobacco use     Tobacco Use: History  Smoking Status   Former Smoker   Packs/day: 0.50   Years: 45.00   Types: Cigarettes  Smokeless Tobacco   Never Used    Labs: Recent [employer] for ITP Cardiac and Pulmonary Rehab Latest Ref Rng & Units 12/15/2015 02/04/2016 02/10/2016   Cholestrol 100 - 199 mg/dL 207(H) 172 -   LDLCALC 0 - 99 mg/dL 149(H) 115(H) -   HDL >39 mg/dL 44 45 -   Trlycerides 0 - 149 mg/dL 70 62 -   Hemoglobin A1c 4.6 - 6.5 % - - 6.1       ADL [facility]:     Pulmonary Assessment Scores    Row Name 10/04/16 1213         ADL UCSD   SOB Score total 24     Rest 0     Walk 1     Stairs 3     Bath 0     Dress 1     Shop 1        Pulmonary Function Assessment:     Pulmonary Function Assessment - 10/04/16 1210      Pulmonary Function Tests  FVC% 72 %   FEV1% 79 %   FEV1/FVC Ratio 86   RV% 45 %   DLCO% 52 %     Breath   Bilateral Breath Sounds Decreased;Rales;Basilar   Shortness of Breath Yes;Limiting activity      Exercise Target Goals: Date: 10/04/16  Exercise Program Goal: Individual exercise prescription set with THRR, safety & activity barriers. Participant demonstrates ability to understand and report RPE using BORG scale, to self-measure pulse accurately, and to acknowledge the importance of the exercise prescription.  Exercise Prescription Goal: Starting with aerobic activity 30 plus minutes a day, 3 days per week for initial exercise prescription. Provide home exercise prescription and guidelines that participant acknowledges understanding prior to discharge.  Activity Barriers & Risk Stratification:     Activity Barriers & Cardiac Risk Stratification - 10/04/16 1209      Activity Barriers & Cardiac Risk Stratification   Activity Barriers Back  Problems;Deconditioning;Joint Problems   Cardiac Risk Stratification Moderate      6 Minute Walk:     6 Minute Walk    Row Name 10/04/16 1220         6 Minute Walk   Distance 1450 feet     Walk Time 6 minutes     # of Rest Breaks 0     MPH 2.75     METS 3.3     RPE 8     Perceived Dyspnea  2     VO2 Peak 11.55     Symptoms No     Resting HR 73 bpm     Resting BP 112/62     Max Ex. HR 106 bpm     Max Ex. BP 142/74       Interval HR   Baseline HR 73     1 Minute HR 101     2 Minute HR 105     3 Minute HR 101     4 Minute HR 106     5 Minute HR 102     6 Minute HR 101     2 Minute Post HR 80     Interval Heart Rate? Yes       Interval Oxygen   Interval Oxygen? Yes     Baseline Oxygen Saturation % 97 %     1 Minute Oxygen Saturation % 94 %     2 Minute Oxygen Saturation % 92 %     3 Minute Oxygen Saturation % 95 %     4 Minute Oxygen Saturation % 92 %     5 Minute Oxygen Saturation % 91 %     6 Minute Oxygen Saturation % 94 %     2 Minute Post Oxygen Saturation % 97 %        Initial Exercise Prescription:     Initial Exercise Prescription - 10/04/16 1200      Date of Initial Exercise RX and Referring Provider   Date 10/04/16   Referring Provider Simonds     Treadmill   MPH 2   Grade 2.5   Minutes 15   METs 3     Elliptical   Level 1   Speed 2.5   Minutes 15   METs 3     Prescription Details   Frequency (times per week) 3   Duration Progress to 45 minutes of aerobic exercise without signs/symptoms of physical distress     Intensity   THRR 40-80% of Max Heartrate 106-139   Ratings  of Perceived Exertion 11-13   Perceived Dyspnea 0-4     Progression   Progression Continue to progress workloads to maintain intensity without signs/symptoms of physical distress.     Resistance Training   Training Prescription Yes   Weight 3   Reps 10-15      Perform Capillary Blood Glucose checks as needed.  Exercise Prescription  Changes:   Exercise Comments:   Discharge Exercise Prescription (Final Exercise Prescription Changes):    Nutrition:  Target Goals: Understanding of nutrition guidelines, daily intake of sodium 1500mg , cholesterol 200mg , calories 30% from fat and 7% or less from saturated fats, daily to have 5 or more servings of fruits and vegetables.  Biometrics:     Pre Biometrics - 10/04/16 1213      Pre Biometrics   Height 5' 3.5" (1.613 m)   Weight 180 lb 12.8 oz (82 kg)   Waist Circumference 38.75 inches   Hip Circumference 43.5 inches   Waist to Hip Ratio 0.89 %   BMI (Calculated) 31.6       Nutrition Therapy Plan and Nutrition Goals:   Nutrition Discharge: Rate Your Plate Scores:   Psychosocial: Target Goals: Acknowledge presence or absence of depression, maximize coping skills, provide positive support system. Participant is able to verbalize types and ability to use techniques and skills needed for reducing stress and depression.  Initial Review & Psychosocial Screening:     Initial Psych Review & Screening - 10/04/16 [place]? Yes   Comments Ms Kobayashi is excited to start [place]. She has been recently diagnoised with Pulmonary Fibrosis and wants to learn all she can about the disease. She has been sent to [facility] and will have excellent support from her husband and two children.excellent support from from her      Barriers   Psychosocial barriers to participate in program The patient should benefit from training in stress management and relaxation.     Screening Interventions   Interventions Encouraged to exercise;Other (comment)   Comments Ms Oldenkamp is required to meet with a psychologist by the Transplant Team.      Quality of Life Scores:     Quality of Life - 10/04/16 1233      Quality of Life Scores   Health/Function Pre 20.56 %   Socioeconomic Pre 19.63 %   Psych/Spiritual Pre 21 %   Family Pre 21 %    GLOBAL Pre 20.5 %      PHQ-9: Recent Review Flowsheet Data    Depression screen [facility] 2/9 10/04/2016 08/15/2016   Decreased Interest 0 1   Down, Depressed, Hopeless 0 1   PHQ - 2 Score 0 2   Altered sleeping 2 3   Tired, decreased energy 2 2   Change in appetite 0 1   Feeling bad or failure about yourself  0 1   Trouble concentrating 0 1   Moving slowly or fidgety/restless 0 1   Suicidal thoughts 0 0   PHQ-9 Score 4 11   Difficult doing work/chores Not difficult at all Somewhat difficult      Psychosocial Evaluation and Intervention:   Psychosocial Re-Evaluation:  Education: Education Goals: Education classes will be provided on a weekly basis, covering required topics. Participant will state understanding/return demonstration of topics presented.  Learning Barriers/Preferences:     Learning Barriers/Preferences - 10/04/16 1210      Learning Barriers/Preferences   Learning Barriers None  Learning Preferences None      Education Topics: Initial Evaluation Education: - Verbal, written and demonstration of respiratory meds, RPE/PD scales, oximetry and breathing techniques. Instruction on use of nebulizers and MDIs: cleaning and proper use, rinsing mouth with steroid doses and importance of monitoring MDI activations. Flowsheet Row Pulmonary Rehab from 10/04/2016 in [facility] Cardiac and Pulmonary Rehab  Date  10/04/16  Educator  LB  Instruction Review Code  2- meets goals/outcomes      General Nutrition Guidelines/Fats and Fiber: -Group instruction provided by verbal, written material, models and posters to present the general guidelines for heart healthy nutrition. Gives an explanation and review of dietary fats and fiber.   Controlling Sodium/Reading Food Labels: -Group verbal and written material supporting the discussion of sodium use in heart healthy nutrition. Review and explanation with models, verbal and written materials for utilization of the food  label.   Exercise Physiology & Risk Factors: - Group verbal and written instruction with models to review the exercise physiology of the cardiovascular system and associated critical values. Details cardiovascular disease risk factors and the goals associated with each risk factor.   Aerobic Exercise & Resistance Training: - Gives group verbal and written discussion on the health impact of inactivity. On the components of aerobic and resistive training programs and the benefits of this training and how to safely progress through these programs.   Flexibility, Balance, General Exercise Guidelines: - Provides group verbal and written instruction on the benefits of flexibility and balance training programs. Provides general exercise guidelines with specific guidelines to those with heart or lung disease. Demonstration and skill practice provided.   Stress Management: - Provides group verbal and written instruction about the health risks of elevated stress, cause of high stress, and healthy ways to reduce stress.   Depression: - Provides group verbal and written instruction on the correlation between heart/lung disease and depressed mood, treatment options, and the stigmas associated with seeking treatment.   Exercise & Equipment Safety: - Individual verbal instruction and demonstration of equipment use and safety with use of the equipment.   Infection Prevention: - Provides verbal and written material to individual with discussion of infection control including proper hand washing and proper equipment cleaning during exercise session. Flowsheet Row Pulmonary Rehab from 10/04/2016 in [facility] Cardiac and Pulmonary Rehab  Date  10/04/16  Educator  LB  Instruction Review Code  2- meets goals/outcomes      Falls Prevention: - Provides verbal and written material to individual with discussion of falls prevention and safety. Flowsheet Row Pulmonary Rehab from 10/04/2016 in [facility] Cardiac and  Pulmonary Rehab  Date  10/04/16  Educator  LB  Instruction Review Code  2- meets goals/outcomes      Diabetes: - Individual verbal and written instruction to review signs/symptoms of diabetes, desired ranges of glucose level fasting, after meals and with exercise. Advice that pre and post exercise glucose checks will be done for 3 sessions at entry of program.   Chronic Lung Diseases: - Group verbal and written instruction to review new updates, new respiratory medications, new advancements in procedures and treatments. Provide informative websites and "800" numbers of self-education.   Lung Procedures: - Group verbal and written instruction to describe testing methods done to diagnose lung disease. Review the outcome of test results. Describe the treatment choices: Pulmonary Function Tests, ABGs and oximetry.   Energy Conservation: - Provide group verbal and written instruction for methods to conserve energy, plan and organize activities. Instruct on pacing  techniques, use of adaptive equipment and posture/positioning to relieve shortness of breath.   Triggers: - Group verbal and written instruction to review types of environmental controls: home humidity, furnaces, filters, dust mite/pet prevention, HEPA vacuums. To discuss weather changes, air quality and the benefits of nasal washing.   Exacerbations: - Group verbal and written instruction to provide: warning signs, infection symptoms, calling MD promptly, preventive modes, and value of vaccinations. Review: effective airway clearance, coughing and/or vibration techniques. Create an Sports administrator.   Oxygen: - Individual and group verbal and written instruction on oxygen therapy. Includes supplement oxygen, available portable oxygen systems, continuous and intermittent flow rates, oxygen safety, concentrators, and Medicare reimbursement for oxygen.   Respiratory Medications: - Group verbal and written instruction to review  medications for lung disease. Drug class, frequency, complications, importance of spacers, rinsing mouth after steroid MDI's, and proper cleaning methods for nebulizers.   AED/CPR: - Group verbal and written instruction with the use of models to demonstrate the basic use of the AED with the basic ABC's of resuscitation.   Breathing Retraining: - Provides individuals verbal and written instruction on purpose, frequency, and proper technique of diaphragmatic breathing and pursed-lipped breathing. Applies individual practice skills. Flowsheet Row Pulmonary Rehab from 10/04/2016 in [facility] Cardiac and Pulmonary Rehab  Date  10/04/16  Educator  LB  Instruction Review Code  2- meets goals/outcomes      Anatomy and Physiology of the Lungs: - Group verbal and written instruction with the use of models to provide basic lung anatomy and physiology related to function, structure and complications of lung disease.   Heart Failure: - Group verbal and written instruction on the basics of heart failure: signs/symptoms, treatments, explanation of ejection fraction, enlarged heart and cardiomyopathy.   Sleep Apnea: - Individual verbal and written instruction to review Obstructive Sleep Apnea. Review of risk factors, methods for diagnosing and types of masks and machines for OSA.   Anxiety: - Provides group, verbal and written instruction on the correlation between heart/lung disease and anxiety, treatment options, and management of anxiety.   Relaxation: - Provides group, verbal and written instruction about the benefits of relaxation for patients with heart/lung disease. Also provides patients with examples of relaxation techniques.   Knowledge Questionnaire Score:     Knowledge Questionnaire Score - 10/04/16 1210      Knowledge Questionnaire Score   Pre Score 4/10       Core Components/Risk Factors/Patient Goals at Admission:     Personal Goals and Risk Factors at Admission - 10/04/16  1218      Core Components/Risk Factors/Patient Goals on Admission    Weight Management Yes;Weight Loss   Intervention Weight Management: Develop a combined nutrition and exercise program designed to reach desired caloric intake, while maintaining appropriate intake of nutrient and fiber, sodium and fats, and appropriate energy expenditure required for the weight goal.;Weight Management: Provide education and appropriate resources to help participant work on and attain dietary goals.;Weight Management/Obesity: Establish reasonable short term and long term weight goals.  Ms Baran will meet with the [facility] Transplant Dietitian every 6 months. She is working on drinking more water and decrease soda intake.   Admit Weight 180 lb 12.8 oz (82 kg)   Goal Weight: Short Term 170 lb (77.1 kg)   Goal Weight: Long Term 140 lb (63.5 kg)   Expected Outcomes Short Term: Continue to assess and modify interventions until short term weight is achieved;Long Term: Adherence to nutrition and physical activity/exercise program aimed toward  attainment of established weight goal;Weight Loss: Understanding of general recommendations for a balanced deficit meal plan, which promotes 1-2 lb weight loss per week and includes a negative energy balance of [phone] kcal/d;Understanding recommendations for meals to include 15-35% energy as protein, 25-35% energy from fat, 35-60% energy from carbohydrates, less than 200mg  of dietary cholesterol, 20-35 gm of total fiber daily;Understanding of distribution of calorie intake throughout the day with the consumption of 4-5 meals/snacks;Weight Maintenance: Understanding of the daily nutrition guidelines, which includes 25-35% calories from fat, 7% or less cal from saturated fats, less than 200mg  cholesterol, less than 1.5gm of sodium, & 5 or more servings of fruits and vegetables daily   Sedentary Yes   Intervention Provide advice, education, support and counseling about physical activity/exercise  needs.;Develop an individualized exercise prescription for aerobic and resistive training based on initial evaluation findings, risk stratification, comorbidities and participant's personal goals.   Expected Outcomes Achievement of increased cardiorespiratory fitness and enhanced flexibility, muscular endurance and strength shown through measurements of functional capacity and personal statement of participant.   Increase Strength and Stamina Yes   Intervention Provide advice, education, support and counseling about physical activity/exercise needs.;Develop an individualized exercise prescription for aerobic and resistive training based on initial evaluation findings, risk stratification, comorbidities and participant's personal goals.   Expected Outcomes Achievement of increased cardiorespiratory fitness and enhanced flexibility, muscular endurance and strength shown through measurements of functional capacity and personal statement of participant.   Improve shortness of breath with ADL's Yes   Intervention Provide education, individualized exercise plan and daily activity instruction to help decrease symptoms of SOB with activities of daily living.   Expected Outcomes Short Term: Achieves a reduction of symptoms when performing activities of daily living.   Develop more efficient breathing techniques such as purse lipped breathing and diaphragmatic breathing; and practicing self-pacing with activity Yes   Intervention Provide education, demonstration and support about specific breathing techniuqes utilized for more efficient breathing. Include techniques such as pursed lipped breathing, diaphragmatic breathing and self-pacing activity.   Expected Outcomes Short Term: Participant will be able to demonstrate and use breathing techniques as needed throughout daily activities.   Increase knowledge of respiratory medications and ability to use respiratory devices properly  Yes  Ms Peasley takes [place]. She is  having nausea with the drug, but she is determined to continue on the medication.    Intervention Provide education and demonstration as needed of appropriate use of medications, inhalers, and oxygen therapy.   Expected Outcomes Short Term: Achieves understanding of medications use. Understands that oxygen is a medication prescribed by physician. Demonstrates appropriate use of inhaler and oxygen therapy.   Lipids Yes   Intervention Provide education and support for participant on nutrition & aerobic/resistive exercise along with prescribed medications to achieve LDL 70mg , HDL >40mg .   Expected Outcomes Short Term: Participant states understanding of desired cholesterol values and is compliant with medications prescribed. Participant is following exercise prescription and nutrition guidelines.;Long Term: Cholesterol controlled with medications as prescribed, with individualized exercise RX and with personalized nutrition plan. Value goals: LDL < 70mg , HDL > 40 mg.      Core Components/Risk Factors/Patient Goals Review:    Core Components/Risk Factors/Patient Goals at Discharge (Final Review):    ITP Comments:   Comments: Ms Gleason plans to start [place] on 10/10/16 and attend 3 days/week.

## 2016-10-04 NOTE — Patient Instructions (Signed)
Patient Instructions  Patient Details  Name: Kelli Morris MRN: [id number] Date of Birth: [birth year] Referring Provider:  Wilhelmina Mcardle, MD  Below are the personal goals you chose as well as exercise and nutrition goals. Our goal is to help you keep on track towards obtaining and maintaining your goals. We will be discussing your progress on these goals with you throughout the program.  Initial Exercise Prescription:     Initial Exercise Prescription - 10/04/16 1200      Date of Initial Exercise RX and Referring Provider   Date 10/04/16   Referring Provider Simonds     Treadmill   MPH 2   Grade 2.5   Minutes 15   METs 3     Elliptical   Level 1   Speed 2.5   Minutes 15   METs 3     Prescription Details   Frequency (times per week) 3   Duration Progress to 45 minutes of aerobic exercise without signs/symptoms of physical distress     Intensity   THRR 40-80% of Max Heartrate 106-139   Ratings of Perceived Exertion 11-13   Perceived Dyspnea 0-4     Progression   Progression Continue to progress workloads to maintain intensity without signs/symptoms of physical distress.     Resistance Training   Training Prescription Yes   Weight 3   Reps 10-15      Exercise Goals: Frequency: Be able to perform aerobic exercise three times per week working toward 3-5 days per week.  Intensity: Work with a perceived exertion of 11 (fairly light) - 15 (hard) as tolerated. Follow your new exercise prescription and watch for changes in prescription as you progress with the program. Changes will be reviewed with you when they are made.  Duration: You should be able to do 30 minutes of continuous aerobic exercise in addition to a 5 minute warm-up and a 5 minute cool-down routine.  Nutrition Goals: Your personal nutrition goals will be established when you do your nutrition analysis with the dietician.  The following are nutrition guidelines to follow: Cholesterol <  200mg /day Sodium < 1500mg /day Fiber: Women over 50 yrs - 21 grams per day  Personal Goals:     Personal Goals and Risk Factors at Admission - 10/04/16 1218      Core Components/Risk Factors/Patient Goals on Admission    Weight Management Yes;Weight Loss   Intervention Weight Management: Develop a combined nutrition and exercise program designed to reach desired caloric intake, while maintaining appropriate intake of nutrient and fiber, sodium and fats, and appropriate energy expenditure required for the weight goal.;Weight Management: Provide education and appropriate resources to help participant work on and attain dietary goals.;Weight Management/Obesity: Establish reasonable short term and long term weight goals.  Kelli Morris will meet with the [facility] Transplant Dietitian every 6 months. She is working on drinking more water and decrease soda intake.   Admit Weight 180 lb 12.8 oz (82 kg)   Goal Weight: Short Term 170 lb (77.1 kg)   Goal Weight: Long Term 140 lb (63.5 kg)   Expected Outcomes Short Term: Continue to assess and modify interventions until short term weight is achieved;Long Term: Adherence to nutrition and physical activity/exercise program aimed toward attainment of established weight goal;Weight Loss: Understanding of general recommendations for a balanced deficit meal plan, which promotes 1-2 lb weight loss per week and includes a negative energy balance of [phone] kcal/d;Understanding recommendations for meals to include 15-35% energy as protein, 25-35% energy from  fat, 35-60% energy from carbohydrates, less than 200mg  of dietary cholesterol, 20-35 gm of total fiber daily;Understanding of distribution of calorie intake throughout the day with the consumption of 4-5 meals/snacks;Weight Maintenance: Understanding of the daily nutrition guidelines, which includes 25-35% calories from fat, 7% or less cal from saturated fats, less than 200mg  cholesterol, less than 1.5gm of sodium, & 5 or  more servings of fruits and vegetables daily   Sedentary Yes   Intervention Provide advice, education, support and counseling about physical activity/exercise needs.;Develop an individualized exercise prescription for aerobic and resistive training based on initial evaluation findings, risk stratification, comorbidities and participant's personal goals.   Expected Outcomes Achievement of increased cardiorespiratory fitness and enhanced flexibility, muscular endurance and strength shown through measurements of functional capacity and personal statement of participant.   Increase Strength and Stamina Yes   Intervention Provide advice, education, support and counseling about physical activity/exercise needs.;Develop an individualized exercise prescription for aerobic and resistive training based on initial evaluation findings, risk stratification, comorbidities and participant's personal goals.   Expected Outcomes Achievement of increased cardiorespiratory fitness and enhanced flexibility, muscular endurance and strength shown through measurements of functional capacity and personal statement of participant.   Improve shortness of breath with ADL's Yes   Intervention Provide education, individualized exercise plan and daily activity instruction to help decrease symptoms of SOB with activities of daily living.   Expected Outcomes Short Term: Achieves a reduction of symptoms when performing activities of daily living.   Develop more efficient breathing techniques such as purse lipped breathing and diaphragmatic breathing; and practicing self-pacing with activity Yes   Intervention Provide education, demonstration and support about specific breathing techniuqes utilized for more efficient breathing. Include techniques such as pursed lipped breathing, diaphragmatic breathing and self-pacing activity.   Expected Outcomes Short Term: Participant will be able to demonstrate and use breathing techniques as needed  throughout daily activities.   Increase knowledge of respiratory medications and ability to use respiratory devices properly  Yes  Kelli Morris takes [place]. She is having nausea with the drug, but she is determined to continue on the medication.    Intervention Provide education and demonstration as needed of appropriate use of medications, inhalers, and oxygen therapy.   Expected Outcomes Short Term: Achieves understanding of medications use. Understands that oxygen is a medication prescribed by physician. Demonstrates appropriate use of inhaler and oxygen therapy.   Lipids Yes   Intervention Provide education and support for participant on nutrition & aerobic/resistive exercise along with prescribed medications to achieve LDL 70mg , HDL >40mg .   Expected Outcomes Short Term: Participant states understanding of desired cholesterol values and is compliant with medications prescribed. Participant is following exercise prescription and nutrition guidelines.;Long Term: Cholesterol controlled with medications as prescribed, with individualized exercise RX and with personalized nutrition plan. Value goals: LDL < 70mg , HDL > 40 mg.      Tobacco Use Initial Evaluation: History  Smoking Status   Former Smoker   Packs/day: 0.50   Years: 45.00   Types: Cigarettes  Smokeless Tobacco   Never Used    Copy of goals given to participant.

## 2016-10-10 ENCOUNTER — Ambulatory Visit: Payer: Medicare Other | Admitting: Family Medicine

## 2016-10-10 ENCOUNTER — Other Ambulatory Visit: Payer: Self-pay

## 2016-10-10 ENCOUNTER — Ambulatory Visit (INDEPENDENT_AMBULATORY_CARE_PROVIDER_SITE_OTHER): Payer: Medicare Other | Admitting: Pulmonary Disease

## 2016-10-10 ENCOUNTER — Encounter: Payer: Self-pay | Admitting: Pulmonary Disease

## 2016-10-10 ENCOUNTER — Encounter: Payer: Medicare Other | Attending: Pulmonary Disease | Admitting: *Deleted

## 2016-10-10 VITALS — BP 112/70 | HR 78 | Ht 63.5 in | Wt 183.0 lb

## 2016-10-10 DIAGNOSIS — J8410 Pulmonary fibrosis, unspecified: Secondary | ICD-10-CM

## 2016-10-10 DIAGNOSIS — E669 Obesity, unspecified: Secondary | ICD-10-CM

## 2016-10-10 DIAGNOSIS — J84112 Idiopathic pulmonary fibrosis: Secondary | ICD-10-CM | POA: Diagnosis present

## 2016-10-10 NOTE — Telephone Encounter (Signed)
Patient was supposed to discontinue this medication. Please see if she did this. Thanks.

## 2016-10-10 NOTE — Patient Instructions (Addendum)
1) Weight loss suggestions: No processed sugars. Limit simple carbohydrates and starches. Lot of vegetables, moderate whole fruits (no juice), eggs, healthy cuts of meat/poultry/fish, moderate nuts and dairy  2) continue Ofev. Immodium for diarrhea. Consider trial of Metamucil for watery diarrhea  3) Liver function blood tests in one month

## 2016-10-10 NOTE — Progress Notes (Signed)
Daily Session Note  Patient Details  Name: Kelli Morris MRN: [id number] Date of Birth: 1951/03/23 Referring Provider:   April Manson Pulmonary Rehab from 10/04/2016 in [facility] Cardiac and Pulmonary Rehab  Referring Provider  Simonds      Encounter Date: 10/10/2016  Check In:     Session Check In - 10/10/16 1232      Check-In   Location [facility]-Cardiac & Pulmonary Rehab   Staff Present Carson Myrtle, BS, RRT, Respiratory Bertis Ruddy, BS, ACSM CEP, Exercise Physiologist;Amanda Oletta Darter, BA, ACSM CEP, Exercise Physiologist   Supervising physician immediately available to respond to emergencies [facility] immediately available ER MD   Physician(s) Dr. Jimmye Norman and Dr. Corky Downs   Medication changes reported     No   Fall or balance concerns reported    No   Warm-up and Cool-down Performed on first and last piece of equipment   Resistance Training Performed Yes   VAD Patient? No     Pain Assessment   Currently in Pain? No/denies   Multiple Pain Sites No           Exercise Prescription Changes - 10/10/16 1200      Response to Exercise   Blood Pressure (Admit) 130/70   Blood Pressure (Exit) 124/72   Heart Rate (Admit) 72 bpm   Heart Rate (Exercise) 122 bpm   Heart Rate (Exit) 81 bpm   Oxygen Saturation (Admit) 98 %   Oxygen Saturation (Exercise) 92 %   Oxygen Saturation (Exit) 96 %   Rating of Perceived Exertion (Exercise) 13   Perceived Dyspnea (Exercise) 3     Resistance Training   Training Prescription Yes   Weight 3   Reps 10-15     Treadmill   MPH 2   Grade 2.5   Minutes 15   METs 3     Elliptical   Level 1   Speed 2.5   Minutes 15   METs 3      Goals Met:  Proper associated with RPD/PD & O2 Sat Exercise tolerated well Personal goals reviewed Strength training completed today  Goals Unmet:  Not Applicable  Comments: First full day of exercise!  Patient was oriented to gym and equipment including functions, settings, policies, and procedures.   Patient's individual exercise prescription and treatment plan were reviewed.  All starting workloads were established based on the results of the 6 minute walk test done at initial orientation visit.  The plan for exercise progression was also introduced and progression will be customized based on patient's performance and goals.    Dr. Emily Filbert is Medical Director for [place] and [facility].

## 2016-10-10 NOTE — Telephone Encounter (Signed)
Last filled 09/07/16 Last office visit 09/30/16 Nov 01/02/17

## 2016-10-11 NOTE — Telephone Encounter (Signed)
Spoke with patient states she has stopped medication .

## 2016-10-12 ENCOUNTER — Encounter: Payer: Self-pay | Admitting: Neurology

## 2016-10-12 ENCOUNTER — Ambulatory Visit (INDEPENDENT_AMBULATORY_CARE_PROVIDER_SITE_OTHER): Payer: Medicare Other | Admitting: Neurology

## 2016-10-12 VITALS — BP 135/85 | HR 65 | Ht 63.5 in | Wt 185.0 lb

## 2016-10-12 DIAGNOSIS — M797 Fibromyalgia: Secondary | ICD-10-CM | POA: Diagnosis not present

## 2016-10-12 NOTE — Progress Notes (Signed)
Reason for visit: Fibromyalgia  Kelli Morris is an 65 y.o. female  History of present illness:  Kelli Morris is a 66 year old right-handed white female with a history of fibromyalgia. The patient has come off of her carbamazepine, she is no longer on oxycodone. She has been diagnosed with pulmonary fibrosis recently, she has been evaluated for a lung transplant through [facility]. The patient is not on oxygen at this point. She is to having some fibromyalgia pain, but overall she is doing well. She does have occasional sharp shooting pains in the legs. She returns for an evaluation.  Past Medical History:  Diagnosis Date  . Arrhythmia    afib  . Cancer ([place])    uterine  . Fibromyalgia    Chronic fatigue  . GERD (gastroesophageal reflux disease)   . Headache(784.0)   . History of headache   . History of low back pain   . Hyperlipidemia   . Hypertension   . Hypothyroidism   . Idiopathic pulmonary fibrosis ([place])   . Lyme disease   . PVD (peripheral vascular disease) ([place])   . Strain, cervical   . Thrombosis   . Tobacco use     Past Surgical History:  Procedure Laterality Date  . ABDOMINAL HYSTERECTOMY    . BREAST BIOPSY Left 1990  . LUMBAR SPINE SURGERY    . OVARY SURGERY    . TENDON REPAIR Right    Hand  . TONSILLECTOMY    . TUBAL LIGATION Bilateral   . ULNAR NERVE TRANSPOSITION Left   . VAGINA SURGERY     biopsy  . VULVECTOMY PARTIAL      Family History  Problem Relation Age of Onset  . Uterine cancer Mother   . Leukemia Mother   . Congestive Heart Failure Mother   . COPD Mother   . Diabetes Father   . Congestive Heart Failure Father   . Heart disease Maternal Grandfather   . Heart disease Paternal Grandfather   . Arthritis Brother   . Neuropathy Brother   . Breast cancer Neg Hx     Social history:  reports that she has quit smoking. Her smoking use included Cigarettes. She has a 22.50 pack-year smoking history. She has never used smokeless  tobacco. She reports that she drinks alcohol. She reports that she does not use drugs.   No Known Allergies  Medications:  Prior to Admission medications   Medication Sig Start Date End Date Taking? Authorizing Provider  Aspirin-Caffeine (BC FAST PAIN RELIEF ARTHRITIS) 1000-65 MG PACK Take 800 mg by mouth 4 (four) times daily.    Yes Historical Provider, MD  atorvastatin (LIPITOR) 40 MG tablet TAKE 1 TABLET (40 MG TOTAL) BY MOUTH DAILY. 10/03/16  Yes Wellington Hampshire, MD  levothyroxine (SYNTHROID, LEVOTHROID) 25 MCG tablet Take 1 tablet (25 mcg total) by mouth daily before breakfast. 07/08/16  Yes Leone Haven, MD  Nintedanib (OFEV) 100 MG CAPS Take 100 mg by mouth 2 (two) times daily. 08/23/16  Yes Wilhelmina Mcardle, MD  pantoprazole (PROTONIX) 20 MG tablet Take 20 mg by mouth daily.   Yes Historical Provider, MD  sertraline (ZOLOFT) 50 MG tablet Take 1 tablet (50 mg total) by mouth daily. 08/15/16  Yes Leone Haven, MD    ROS:  Out of a complete 14 system review of symptoms, the patient complains only of the following symptoms, and all other reviewed systems are negative.  Fatigue, weight change Shortness of breath Leg swelling  Diarrhea Frequent waking, daytime sleepiness Joint pain, back pain, achy muscles, muscle cramps, neck pain Bruising easily Headache  Blood pressure 135/85, pulse 65, height 5' 3.5" (1.613 m), weight 185 lb (83.9 kg).  Physical Exam  General: The patient is alert and cooperative at the time of the examination.  Skin: No significant peripheral edema is noted.   Neurologic Exam  Mental status: The patient is alert and oriented x 3 at the time of the examination. The patient has apparent normal recent and remote memory, with an apparently normal attention span and concentration ability.   Cranial nerves: Facial symmetry is present. Speech is normal, no aphasia or dysarthria is noted. Extraocular movements are full. Visual fields are full.  Motor:  The patient has good strength in all 4 extremities.  Sensory examination: Soft touch sensation is symmetric on the face, arms, and legs.  Coordination: The patient has good finger-nose-finger and heel-to-shin bilaterally.  Gait and station: The patient has a normal gait. Tandem gait is normal. Romberg is negative. No drift is seen.  Reflexes: Deep tendon reflexes are symmetric.   Assessment/Plan:  1. Fibromyalgia  At this time, the patient is not getting any prescriptions to this office. She is tolerating the fibromyalgia pain fairly well. We will have her return to this office on an as-needed basis, she will contact us if needed.  Jill Alexanders MD 10/12/2016 4:18 PM  [facility] [address] [place], [place] [address]  Phone [phone] Fax [phone]

## 2016-10-13 NOTE — Progress Notes (Signed)
PULMONARY OFFICE FOLLOW UP NOTE  Requesting MD/Service: Caryl Bis, MD Date of initial consultation: 03/08/16 Reason for consultation: Pulmonary fibrosis  PT PROFILE: 65 y.o. F smoker referred 03/08/16 by Dr Caryl Bis for evaluation of pulmonary fibrosis. She initially presented with a "bad cold" in Nov 2016 with persistent symptoms into December when a CXR was performed and she was diagnosed with pneumonia. She was treated with antibiotics with improvement. However, she continued to have persistent NP cough. Because of the abnormal CXR, she underwent CT chest with findings c/w IPF She has a history of arthritis which had been labelled as RA in the past and she was even on MTX briefly years ago. She has no rash or other skin lesions, sicca symptoms, renal disease. She has a history of prior DVT that occurred after LE injury.   DATA: 07/31/15: Right peroneal vein suspicious for intraluminal thrombus, although not well demonstrated. Otherwise, no other right lower extremity DVT. 01/14/16: No DVT on LE venous US 01/26/16 HRCT: Extensive peripheral basilar predominant reticulation, ground-glass attenuation and traction bronchiectasis in both lungs, with early honeycombing in the lower lobes. 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 Indeterminate irregular 1.2 cm nodular focus of consolidation in the basilar right lower lobe. This may represent part of the interstitial lung disease, however a true pulmonary nodule cannot be excluded.  03/08/16: Rheumatoid factor and ANA negative 04/27/16 PFTs: No obstruction, mild-mod restriction, mod decrease in DLCO 04/27/16 CXR: diffuse interstitial lung disease 08/16/16 CT chest: Right lower lobe pulmonary nodule measures slightly smaller although this difference may be technical only. Recommend an additional follow-up chest CT in 6 months to document longer term stability. Stable changes of presumed usual interstitial  pneumonia No acute findings. 08/19/16 PFTs: No obstruction, mild-mod restriction, mod decrease in DLCO - [place] from prior study 09/26/16 Lung transplant eval @ [facility] [place]. Recommend weight loss of 10-25#. Pulm rehab initiated  SUBJ: No significant change in respiratory symptoms. Has not smoked since 05/23/16. Tolerating Ofev but with diarrhea. Uses immodium with good effect. Denies CP, fever, purulent sputum, hemoptysis, LE edema and calf tenderness   Vitals:   10/10/16 0906  BP: 112/70  Pulse: 78  SpO2: 98%  Weight: 183 lb (83 kg)  Height: 5' 3.5" (1.613 m)    EXAM:  Gen: WDWN, NAD HEENT: WNL Neck: No JVD Lungs: B crackles, no wheezes Cardiovascular: Normal rate, reg rhythm, no murmurs noted Abdomen: Soft, nontender, normal BS Ext: without clubbing. Neuro: intact on limited exam  DATA:   BMP Latest Ref Rng & Units 06/30/2016 02/10/2016 07/31/2015  Glucose 70 - 99 mg/dL 83 91 135(H)  BUN 6 - 23 mg/dL 10 20 10   Creatinine 0.40 - 1.20 mg/dL 1.10 0.96 0.98  BUN/Creat Ratio 11 - 26 - - -  Sodium 135 - 145 mEq/L 140 135 138  Potassium 3.5 - 5.1 mEq/L 4.3 4.5 3.9  Chloride 96 - 112 mEq/L 106 101 106  CO2 19 - 32 mEq/L 28 25 25   Calcium 8.4 - 10.5 mg/dL 9.5 9.6 8.9    CBC Latest Ref Rng & Units 06/02/2016 02/10/2016 07/31/2015  WBC 4.0 - 10.5 K/uL 6.9 8.4 7.7  Hemoglobin 12.0 - 15.0 g/dL 12.5 13.2 13.3  Hematocrit 36.0 - 46.0 % 37.3 40.1 40.3  Platelets 150.0 - 400.0 K/uL 253.0 312.0 269   CXR:  NNF   IMPRESSION:     ICD-9-CM ICD-10-CM   1. IPF (idiopathic pulmonary fibrosis) (HCC) 516.31 J84.112 Hepatic function panel  2. 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 Curently undergoing transplant eval @ [place]. 10-25# weight loss required to be considered a candidate  1) UIP/IPF - presumptive diagnosis based on characteristic findings on CT chest. Stable by CT chest and PFTs 2) RLL lung nodule - incidentally found on CT chest. Stable over 6 months of surveillance 3) Former  smoker - abstinent since 05/2016   PLAN:  1) Cont Ofev. LFTs recently performed @ [facility] and were normal. Repeat LFTs in one months. Cont PRN immodium. Consider trial of Metamucil for watery diarrhea 2) Weight loss suggestions: No processed sugars. Limit simple carbohydrates and starches. Lot of vegetables, moderate whole fruits (no juice), eggs, healthy cuts of meat/poultry/fish, moderate nuts and dairy 3) ROV 3 months  Merton Border, MD PCCM service Mobile [phone] Pager [phone] 10/13/2016

## 2016-10-17 ENCOUNTER — Encounter: Payer: Medicare Other | Admitting: *Deleted

## 2016-10-17 DIAGNOSIS — J84112 Idiopathic pulmonary fibrosis: Secondary | ICD-10-CM | POA: Diagnosis not present

## 2016-10-17 DIAGNOSIS — J8410 Pulmonary fibrosis, unspecified: Secondary | ICD-10-CM

## 2016-10-17 NOTE — Progress Notes (Signed)
Daily Session Note  Patient Details  Name: Kelli Morris MRN: [id number] Date of Birth: 1951/03/17 Referring Provider:   [name] Pulmonary Rehab from 10/04/2016 in [facility] Cardiac and Pulmonary Rehab  Referring Provider  Simonds      Encounter Date: 10/17/2016  Check In:     Session Check In - 10/17/16 1121      Check-In   Location [facility]-Cardiac & Pulmonary Rehab   Staff Present Gerlene Burdock, RN, Moises Blood, BS, ACSM CEP, Exercise Physiologist;Amanda Oletta Darter, [place], ACSM CEP, Exercise Physiologist   Supervising physician immediately available to respond to emergencies [facility] immediately available ER MD   Physician(s) Cinda Quest and Joni Fears   Medication changes reported     No   Fall or balance concerns reported    No   Warm-up and Cool-down Performed on first and last piece of equipment   Resistance Training Performed Yes   VAD Patient? No     Pain Assessment   Currently in Pain? No/denies   Multiple Pain Sites No         Goals Met:  Proper associated with RPD/PD & O2 Sat Independence with exercise equipment Exercise tolerated well Strength training completed today  Goals Unmet:  Not Applicable  Comments: Pt able to follow exercise prescription today without complaint.  Will continue to monitor for progression.    Dr. Emily Filbert is Medical Director for [place] and [facility].

## 2016-10-19 ENCOUNTER — Encounter: Payer: Medicare Other | Admitting: *Deleted

## 2016-10-19 DIAGNOSIS — J84112 Idiopathic pulmonary fibrosis: Secondary | ICD-10-CM | POA: Diagnosis not present

## 2016-10-19 DIAGNOSIS — J8410 Pulmonary fibrosis, unspecified: Secondary | ICD-10-CM

## 2016-10-19 NOTE — Progress Notes (Signed)
Daily Session Note  Patient Details  Name: Kelli Morris MRN: [id number] Date of Birth: 09-24-1951 Referring Provider:   April Manson Pulmonary Rehab from 10/04/2016 in [facility] Cardiac and Pulmonary Rehab  Referring Provider  Simonds      Encounter Date: 10/19/2016  Check In:     Session Check In - 10/19/16 1014      Check-In   Location [facility]-Cardiac & Pulmonary Rehab   Staff Present Alberteen Sam, MA, ACSM RCEP, Exercise Physiologist;Laureen Owens Shark, BS, RRT, Respiratory Dareen Piano, BA, ACSM CEP, Exercise Physiologist   Supervising physician immediately available to respond to emergencies [facility] immediately available ER MD   Physician(s) Drs. Alfred Levins and Electra   Medication changes reported     No   Fall or balance concerns reported    No   Warm-up and Cool-down Performed as group-led Location manager Performed Yes   VAD Patient? No     Pain Assessment   Currently in Pain? No/denies   Multiple Pain Sites No         Goals Met:  Proper associated with RPD/PD & O2 Sat Independence with exercise equipment Using PLB without cueing & demonstrates good technique Exercise tolerated well Strength training completed today  Goals Unmet:  Not Applicable  Comments: Pt able to follow exercise prescription today without complaint.  Will continue to monitor for progression.    Dr. Emily Filbert is Medical Director for [place] and [facility].

## 2016-10-21 ENCOUNTER — Telehealth: Payer: Self-pay | Admitting: Pulmonary Disease

## 2016-10-21 ENCOUNTER — Encounter: Payer: Medicare Other | Admitting: *Deleted

## 2016-10-21 DIAGNOSIS — J8410 Pulmonary fibrosis, unspecified: Secondary | ICD-10-CM

## 2016-10-21 DIAGNOSIS — J84112 Idiopathic pulmonary fibrosis: Secondary | ICD-10-CM | POA: Diagnosis not present

## 2016-10-21 NOTE — Telephone Encounter (Signed)
Pt informed of response. Nothing further needed. 

## 2016-10-21 NOTE — Telephone Encounter (Signed)
Patient is having chronic diarrhea with Nintedanib (OFEV) 100 MG CAPS and not sure what to do? And is worried about her thyroid medication. Please call patient.

## 2016-10-21 NOTE — Progress Notes (Signed)
Daily Session Note  Patient Details  Name: Kelli Morris MRN: [id number] Date of Birth: 15-Oct-1951 Referring Provider:   April Manson Pulmonary Rehab from 10/04/2016 in [facility] Cardiac and Pulmonary Rehab  Referring Provider  Simonds      Encounter Date: 10/21/2016  Check In:     Session Check In - 10/21/16 1044      Check-In   Location [facility]-Cardiac & Pulmonary Rehab   Staff Present Gerlene Burdock, RN, BSN;Mary Kellie Shropshire, RN, BSN, Kela Millin, BA, ACSM CEP, Exercise Physiologist   Supervising physician immediately available to respond to emergencies [facility] immediately available ER MD   Physician(s) Dr. Jimmye Norman and Dr. Jacqualine Code   Medication changes reported     No   Warm-up and Cool-down Performed on first and last piece of equipment   Resistance Training Performed Yes   VAD Patient? No     Pain Assessment   Currently in Pain? No/denies         Goals Met:  Proper associated with RPD/PD & O2 Sat Exercise tolerated well  Goals Unmet:  Not Applicable  Comments:     Dr. Emily Filbert is Medical Director for [place] and [facility].

## 2016-10-21 NOTE — Telephone Encounter (Signed)
Hold Ofev for 3 days, then resume once a day for 7 days, then, if diarrhea is not too bad, increase back to full dose of twice a day  Thanks  Waunita Schooner

## 2016-10-21 NOTE — Telephone Encounter (Signed)
Spoke with pt and she states that she is having diarrhea multiple times daily. States she is getting up approximately 4 times nightly and then throughout the day. She is on Ofev. She also states that she was losing weight when she started the Levothyroxine but says every time she goes to rehab she is gaining 1-2 lbs weekly. Please advise.

## 2016-10-24 ENCOUNTER — Encounter: Payer: Medicare Other | Admitting: *Deleted

## 2016-10-24 DIAGNOSIS — J84112 Idiopathic pulmonary fibrosis: Secondary | ICD-10-CM | POA: Diagnosis not present

## 2016-10-24 DIAGNOSIS — J8410 Pulmonary fibrosis, unspecified: Secondary | ICD-10-CM

## 2016-10-24 NOTE — Progress Notes (Signed)
Daily Session Note  Patient Details  Name: Kelli Morris MRN: [id number] Date of Birth: 1951-11-03 Referring Provider:   [facility] from 10/04/2016 in [facility]  Referring Provider  Simonds      Encounter Date: 10/24/2016  Check In:     Session Check In - 10/24/16 1137      Check-In   Location [facility]   Staff Present Kelli Morris, BS, RRT, Respiratory Therapist;Kelli Morris, BS, ACSM CEP, Exercise Physiologist;Kelli Morris, BA, ACSM CEP, Exercise Physiologist   Supervising physician immediately available to respond to emergencies [facility] immediately available ER MD   Physician(s) Drs. Malinda and Kinner   Medication changes reported     No   Fall or balance concerns reported    No   Warm-up and Cool-down Performed on first and last piece of equipment   Resistance Training Performed Yes   VAD Patient? No     Pain Assessment   Currently in Pain? No/denies   Multiple Pain Sites No         Goals Met:  Proper associated with RPD/PD & O2 Sat Independence with exercise equipment Exercise tolerated well Strength training completed today  Goals Unmet:  Not Applicable  Comments: Pt able to follow exercise prescription today without complaint.  Will continue to monitor for progression.    Dr. Emily Filbert is Medical Director for [place] and [facility].

## 2016-10-31 ENCOUNTER — Encounter: Payer: Self-pay | Admitting: Respiratory Therapy

## 2016-10-31 DIAGNOSIS — J8410 Pulmonary fibrosis, unspecified: Secondary | ICD-10-CM

## 2016-10-31 NOTE — Progress Notes (Signed)
Pulmonary Individual Treatment Plan  Patient Details  Name: Kelli Morris MRN: [id number] Date of Birth: [birth year] Referring Provider:   Flowsheet Row Pulmonary Rehab from 10/04/2016 in [facility] Cardiac and Pulmonary Rehab  Referring Provider  [place]      Initial Encounter Date:  Flowsheet Row Pulmonary Rehab from 10/04/2016 in [facility] Cardiac and Pulmonary Rehab  Date  10/04/16  Referring Provider  Simonds      Visit Diagnosis: Pulmonary fibrosis ([place])  Patient's Home Medications on Admission:  Current Outpatient Prescriptions:    Aspirin-Caffeine (BC FAST PAIN RELIEF ARTHRITIS) 1000-65 MG PACK, Take 800 mg by mouth 4 (four) times daily. , Disp: , Rfl:    atorvastatin (LIPITOR) 40 MG tablet, TAKE 1 TABLET (40 MG TOTAL) BY MOUTH DAILY., Disp: 30 tablet, Rfl: 11   levothyroxine (SYNTHROID, LEVOTHROID) 25 MCG tablet, Take 1 tablet (25 mcg total) by mouth daily before breakfast., Disp: 90 tablet, Rfl: 1   Nintedanib (OFEV) 100 MG CAPS, Take 100 mg by mouth 2 (two) times daily., Disp: 60 capsule, Rfl: 5   pantoprazole (PROTONIX) 20 MG tablet, Take 20 mg by mouth daily., Disp: , Rfl:    sertraline (ZOLOFT) 50 MG tablet, Take 1 tablet (50 mg total) by mouth daily., Disp: 30 tablet, Rfl: 3  Current Facility-Administered Medications:    denosumab (PROLIA) injection 60 mg, 60 mg, Subcutaneous, Once, Leone Haven, MD  Past Medical History: Past Medical History:  Diagnosis Date   Arrhythmia    afib   Cancer ([place])    uterine   Fibromyalgia    Chronic fatigue   GERD (gastroesophageal reflux disease)    Headache(784.0)    History of headache    History of low back pain    Hyperlipidemia    Hypertension    Hypothyroidism    Idiopathic pulmonary fibrosis (HCC)    Lyme disease    PVD (peripheral vascular disease) ([place])    Strain, cervical    Thrombosis    Tobacco use     Tobacco Use: History  Smoking Status   Former Smoker   Packs/day: 0.50    Years: 45.00   Types: Cigarettes  Smokeless Tobacco   Never Used    Labs: Recent [employer] for ITP Cardiac and Pulmonary Rehab Latest Ref Rng & Units 12/15/2015 02/04/2016 02/10/2016   Cholestrol 100 - 199 mg/dL 207(H) 172 -   LDLCALC 0 - 99 mg/dL 149(H) 115(H) -   HDL >39 mg/dL 44 45 -   Trlycerides 0 - 149 mg/dL 70 62 -   Hemoglobin A1c 4.6 - 6.5 % - - 6.1       ADL UCSD:     Pulmonary Assessment Scores    Row Name 10/04/16 1213         ADL UCSD   SOB Score total 24     Rest 0     Walk 1     Stairs 3     Bath 0     Dress 1     Shop 1        Pulmonary Function Assessment:     Pulmonary Function Assessment - 10/04/16 1210      Pulmonary Function Tests   FVC% 72 %   FEV1% 79 %   FEV1/FVC Ratio 86   RV% 45 %   DLCO% 52 %     Breath   Bilateral Breath Sounds Decreased;Rales;Basilar   Shortness of Breath Yes;Limiting activity  Exercise Target Goals:    Exercise Program Goal: Individual exercise prescription set with THRR, safety & activity barriers. Participant demonstrates ability to understand and report RPE using BORG scale, to self-measure pulse accurately, and to acknowledge the importance of the exercise prescription.  Exercise Prescription Goal: Starting with aerobic activity 30 plus minutes a day, 3 days per week for initial exercise prescription. Provide home exercise prescription and guidelines that participant acknowledges understanding prior to discharge.  Activity Barriers & Risk Stratification:     Activity Barriers & Cardiac Risk Stratification - 10/04/16 1209      Activity Barriers & Cardiac Risk Stratification   Activity Barriers Back Problems;Deconditioning;Joint Problems   Cardiac Risk Stratification Moderate      6 Minute Walk:     6 Minute Walk    Row Name 10/04/16 1220         6 Minute Walk   Distance 1450 feet     Walk Time 6 minutes     # of Rest Breaks 0     MPH 2.75     METS 3.3     RPE  8     Perceived Dyspnea  2     VO2 Peak 11.55     Symptoms No     Resting HR 73 bpm     Resting BP 112/62     Max Ex. HR 106 bpm     Max Ex. BP 142/74       Interval HR   Baseline HR 73     1 Minute HR 101     2 Minute HR 105     3 Minute HR 101     4 Minute HR 106     5 Minute HR 102     6 Minute HR 101     2 Minute Post HR 80     Interval Heart Rate? Yes       Interval Oxygen   Interval Oxygen? Yes     Baseline Oxygen Saturation % 97 %     1 Minute Oxygen Saturation % 94 %     2 Minute Oxygen Saturation % 92 %     3 Minute Oxygen Saturation % 95 %     4 Minute Oxygen Saturation % 92 %     5 Minute Oxygen Saturation % 91 %     6 Minute Oxygen Saturation % 94 %     2 Minute Post Oxygen Saturation % 97 %        Initial Exercise Prescription:     Initial Exercise Prescription - 10/04/16 1200      Date of Initial Exercise RX and Referring Provider   Date 10/04/16   Referring Provider Simonds     Treadmill   MPH 2   Grade 2.5   Minutes 15   METs 3     Elliptical   Level 1   Speed 2.5   Minutes 15   METs 3     Prescription Details   Frequency (times per week) 3   Duration Progress to 45 minutes of aerobic exercise without signs/symptoms of physical distress     Intensity   THRR 40-80% of Max Heartrate 106-139   Ratings of Perceived Exertion 11-13   Perceived Dyspnea 0-4     Progression   Progression Continue to progress workloads to maintain intensity without signs/symptoms of physical distress.     Resistance Training   Training Prescription Yes   Weight 3  Reps 10-15      Perform Capillary Blood Glucose checks as needed.  Exercise Prescription Changes:     Exercise Prescription Changes    Row Name 10/10/16 1200 10/25/16 1500           Exercise Review   Progression  -- Yes        Response to Exercise   Blood Pressure (Admit) 130/70 132/78      Blood Pressure (Exercise)  -- 152/82      Blood Pressure (Exit) 124/72 118/80       Heart Rate (Admit) 72 bpm 82 bpm      Heart Rate (Exercise) 122 bpm 155 bpm      Heart Rate (Exit) 81 bpm 80 bpm      Oxygen Saturation (Admit) 98 % 96 %      Oxygen Saturation (Exercise) 92 % 90 %      Oxygen Saturation (Exit) 96 % 97 %      Rating of Perceived Exertion (Exercise) 13 12      Perceived Dyspnea (Exercise) 3 3      Duration  -- Progress to 45 minutes of aerobic exercise without signs/symptoms of physical distress      Intensity  -- THRR unchanged        Resistance Training   Training Prescription Yes Yes      Weight 3 3      Reps 10-15 10-15        Interval Training   Interval Training  -- No        Treadmill   MPH 2 2.5      Grade 2.5 2.5      Minutes 15 15      METs 3 3.78        Elliptical   Level 1 1      Speed 2.5 3.1      Minutes 15 15      METs 3  --         Exercise Comments:     Exercise Comments    Row Name 10/10/16 1237 10/25/16 1558         Exercise Comments First full day of exercise!  Patient was oriented to gym and equipment including functions, settings, policies, and procedures.  Patient's individual exercise prescription and treatment plan were reviewed.  All starting workloads were established based on the results of the 6 minute walk test done at initial orientation visit.  The plan for exercise progression was also introduced and progression will be customized based on patient's performance and goals Maralee has done well in her first weeks of exercise.         Discharge Exercise Prescription (Final Exercise Prescription Changes):     Exercise Prescription Changes - 10/25/16 1500      Exercise Review   Progression Yes     Response to Exercise   Blood Pressure (Admit) 132/78   Blood Pressure (Exercise) 152/82   Blood Pressure (Exit) 118/80   Heart Rate (Admit) 82 bpm   Heart Rate (Exercise) 155 bpm   Heart Rate (Exit) 80 bpm   Oxygen Saturation (Admit) 96 %   Oxygen Saturation (Exercise) 90 %   Oxygen Saturation (Exit) 97  %   Rating of Perceived Exertion (Exercise) 12   Perceived Dyspnea (Exercise) 3   Duration Progress to 45 minutes of aerobic exercise without signs/symptoms of physical distress   Intensity THRR unchanged     Resistance Training   Training Prescription  Yes   Weight 3   Reps 10-15     Interval Training   Interval Training No     Treadmill   MPH 2.5   Grade 2.5   Minutes 15   METs 3.78     Elliptical   Level 1   Speed 3.1   Minutes 15       Nutrition:  Target Goals: Understanding of nutrition guidelines, daily intake of sodium <1558m, cholesterol <2051m calories 30% from fat and 7% or less from saturated fats, daily to have 5 or more servings of fruits and vegetables.  Biometrics:     Pre Biometrics - 10/04/16 1213      Pre Biometrics   Height 5' 3.5" (1.613 m)   Weight 180 lb 12.8 oz (82 kg)   Waist Circumference 38.75 inches   Hip Circumference 43.5 inches   Waist to Hip Ratio 0.89 %   BMI (Calculated) 31.6       Nutrition Therapy Plan and Nutrition Goals:   Nutrition Discharge: Rate Your Plate Scores:   Psychosocial: Target Goals: Acknowledge presence or absence of depression, maximize coping skills, provide positive support system. Participant is able to verbalize types and ability to use techniques and skills needed for reducing stress and depression.  Initial Review & Psychosocial Screening:     Initial Psych Review & Screening - 10/04/16 [id number]   Comments Ms MoArps excited [place] been recently diagnoised with Pulmonary Fibrosis and wants to learn all she can about the disease. [facility] from [facility] husband and two children.excellent support from from her      Barriers   Psychosocial barriers to participate in program The patient should benefit from training in stress management and relaxation.     Screening  Interventions   Interventions Encouraged to exercise;Other (comment)   Comments Ms MoGlasners required to meet with a psychologist by the Transplant Team.      Quality of Life Scores:     Quality of Life - 10/04/16 1233      Quality of Life Scores   Health/Function Pre 20.56 %   Socioeconomic Pre 19.63 %   Psych/Spiritual Pre 21 %   Family Pre 21 %   GLOBAL Pre 20.5 %      PHQ-9: Recent Review Flowsheet Data    [facility] 10/04/2016 08/15/2016   Decreased Interest 0 1   Down, Depressed, Hopeless 0 1   PHQ - 2 Score 0 2   Altered sleeping 2 3   Tired, decreased energy 2 2   Change in appetite 0 1   Feeling bad or failure about yourself  0 1   Trouble concentrating 0 1   Moving slowly or fidgety/restless 0 1   Suicidal thoughts 0 0   PHQ-9 Score 4 11   Difficult doing work/chores Not difficult at all Somewhat difficult      Psychosocial Evaluation and Intervention:     Psychosocial Evaluation - 10/19/16 1150      Psychosocial Evaluation & Interventions   Comments Counselor met with Ms. Thielen today for initial psychosocial evaluation.  She is a [age]ear old diagnosed with IPF about 18 months ago.  She has a spouse of [id number]; an adult son and daughter who live close by; as well as a sister and "lots of cousins."  She has some difficulty sleeping with negative side effects of a medication that results in diarrhea.  Her Dr. is changing this to hopefully a new medication to help with these negative side effects.  She reports a good appetite and no history of depression or anxiety or any current symptoms.  She stated her mood is typically positive; but her Dr. mentioned her "seeing a psychologist" to help with coping with IPF and the impact it has on her and her family.  These are also her primary stressors; as well as finances at this time.  Ms. Dowen desires to be stronger and more prepared to live with this diagnosis in every way.  She wants to increase her activity  level and is [facility].  Staff wil be following with Ms. M throughout the course of this program.       Psychosocial Re-Evaluation:     Psychosocial Re-Evaluation    Row Name 10/19/16 1151 10/24/16 1112           Psychosocial Re-Evaluation   Comments Follow up with Ms. Jerilynn Mages stating she is happy to finally be "moving and doing something" compared to her life before this class.  She is sleeping more soundly now; but still gets up and down with the medication side effects of diarrhea.  She states the holidays will be full of losses with her parents both dying over the past few years; but she chooses to focus on the good memories and building a house at the [employer] to move into next Spring.  She plans to head to [place] over the Thanksgiving holidays and is looking forward to this time with family.   Counselor follow up with Ms. Jerilynn Mages stating she is doing much better now that the Dr. has allowed her to temporarily stop the medication that was causing severe diarrhea.  She has been instructed to start back in a few days taking only 1/2 dose at that time.  She reports having more energy and sleeping better as a result.  She has increased her stamina since coming into this program and reports more energy recently. She continues to be in a positive mood and reports minimal stress at this time. Counselor will continue to follow with Ms. Jerilynn Mages.        Education: Education Goals: Education classes will be provided on a weekly basis, covering required topics. Participant will state understanding/return demonstration of topics presented.  Learning Barriers/Preferences:     Learning Barriers/Preferences - 10/04/16 1210      Learning Barriers/Preferences   Learning Barriers None   Learning Preferences None      Education Topics: Initial Evaluation Education: - Verbal, written and demonstration of respiratory meds, RPE/PD scales,  oximetry and breathing techniques. Instruction on use of nebulizers and MDIs: cleaning and proper use, rinsing mouth with steroid doses and importance of monitoring MDI activations. Flowsheet Row Pulmonary Rehab from 10/24/2016 in [facility] Cardiac and Pulmonary Rehab  Date  10/04/16  Educator  LB  Instruction Review Code  2- meets goals/outcomes      General Nutrition Guidelines/Fats and Fiber: -Group instruction provided by verbal, written material, models and posters to present the general guidelines for heart healthy nutrition. Gives an explanation and review of dietary fats and fiber. Flowsheet Row Pulmonary Rehab from 10/24/2016 in [facility] Cardiac and Pulmonary Rehab  Date  10/24/16  Educator  Cr  Instruction Review Code  2- meets goals/outcomes  Controlling Sodium/Reading Food Labels: -Group verbal and written material supporting the discussion of sodium use in heart healthy nutrition. Review and explanation with models, verbal and written materials for utilization of the food label.   Exercise Physiology & Risk Factors: - Group verbal and written instruction with models to review the exercise physiology of the cardiovascular system and associated critical values. Details cardiovascular disease risk factors and the goals associated with each risk factor. Flowsheet Row Pulmonary Rehab from 10/24/2016 in [facility] Cardiac and Pulmonary Rehab  Date  10/19/16  Educator  AS  Instruction Review Code  2- meets goals/outcomes      Aerobic Exercise & Resistance Training: - Gives group verbal and written discussion on the health impact of inactivity. On the components of aerobic and resistive training programs and the benefits of this training and how to safely progress through these programs.   Flexibility, Balance, General Exercise Guidelines: - Provides group verbal and written instruction on the benefits of flexibility and balance training programs. Provides general exercise guidelines  with specific guidelines to those with heart or lung disease. Demonstration and skill practice provided.   Stress Management: - Provides group verbal and written instruction about the health risks of elevated stress, cause of high stress, and healthy ways to reduce stress.   Depression: - Provides group verbal and written instruction on the correlation between heart/lung disease and depressed mood, treatment options, and the stigmas associated with seeking treatment.   Exercise & Equipment Safety: - Individual verbal instruction and demonstration of equipment use and safety with use of the equipment. Flowsheet Row Pulmonary Rehab from 10/24/2016 in [facility] Cardiac and Pulmonary Rehab  Date  10/10/16  Educator  AS  Instruction Review Code  2- meets goals/outcomes      Infection Prevention: - Provides verbal and written material to individual with discussion of infection control including proper hand washing and proper equipment cleaning during exercise session. Flowsheet Row Pulmonary Rehab from 10/24/2016 in [facility] Cardiac and Pulmonary Rehab  Date  10/10/16  Educator  AS  Instruction Review Code  2- meets goals/outcomes      Falls Prevention: - Provides verbal and written material to individual with discussion of falls prevention and safety. Flowsheet Row Pulmonary Rehab from 10/24/2016 in [facility] Cardiac and Pulmonary Rehab  Date  10/04/16  Educator  LB  Instruction Review Code  2- meets goals/outcomes      Diabetes: - Individual verbal and written instruction to review signs/symptoms of diabetes, desired ranges of glucose level fasting, after meals and with exercise. Advice that pre and post exercise glucose checks will be done for 3 sessions at entry of program.   Chronic Lung Diseases: - Group verbal and written instruction to review new updates, new respiratory medications, new advancements in procedures and treatments. Provide informative websites and "800" numbers of  self-education.   Lung Procedures: - Group verbal and written instruction to describe testing methods done to diagnose lung disease. Review the outcome of test results. Describe the treatment choices: Pulmonary Function Tests, ABGs and oximetry.   Energy Conservation: - Provide group verbal and written instruction for methods to conserve energy, plan and organize activities. Instruct on pacing techniques, use of adaptive equipment and posture/positioning to relieve shortness of breath.   Triggers: - Group verbal and written instruction to review types of environmental controls: home humidity, furnaces, filters, dust mite/pet prevention, HEPA vacuums. To discuss weather changes, air quality and the benefits of nasal washing.   Exacerbations: - Group verbal and written  instruction to provide: warning signs, infection symptoms, calling MD promptly, preventive modes, and value of vaccinations. Review: effective airway clearance, coughing and/or vibration techniques. Create an Sports administrator.   Oxygen: - Individual and group verbal and written instruction on oxygen therapy. Includes supplement oxygen, available portable oxygen systems, continuous and intermittent flow rates, oxygen safety, concentrators, and Medicare reimbursement for oxygen.   Respiratory Medications: - Group verbal and written instruction to review medications for lung disease. Drug class, frequency, complications, importance of spacers, rinsing mouth after steroid MDI's, and proper cleaning methods for nebulizers.   AED/CPR: - Group verbal and written instruction with the use of models to demonstrate the basic use of the AED with the basic ABC's of resuscitation.   Breathing Retraining: - Provides individuals verbal and written instruction on purpose, frequency, and proper technique of diaphragmatic breathing and pursed-lipped breathing. Applies individual practice skills. Flowsheet Row Pulmonary Rehab from 10/24/2016 in  [facility] Cardiac and Pulmonary Rehab  Date  10/10/16  Educator  AS  Instruction Review Code  2- meets goals/outcomes      Anatomy and Physiology of the Lungs: - Group verbal and written instruction with the use of models to provide basic lung anatomy and physiology related to function, structure and complications of lung disease.   Heart Failure: - Group verbal and written instruction on the basics of heart failure: signs/symptoms, treatments, explanation of ejection fraction, enlarged heart and cardiomyopathy.   Sleep Apnea: - Individual verbal and written instruction to review Obstructive Sleep Apnea. Review of risk factors, methods for diagnosing and types of masks and machines for OSA.   Anxiety: - Provides group, verbal and written instruction on the correlation between heart/lung disease and anxiety, treatment options, and management of anxiety.   Relaxation: - Provides group, verbal and written instruction about the benefits of relaxation for patients with heart/lung disease. Also provides patients with examples of relaxation techniques.   Knowledge Questionnaire Score:     Knowledge Questionnaire Score - 10/04/16 1210      Knowledge Questionnaire Score   Pre Score 4/10       Core Components/Risk Factors/Patient Goals at Admission:     Personal Goals and Risk Factors at Admission - 10/04/16 1218      Core Components/Risk Factors/Patient Goals on Admission    Weight Management Yes;Weight Loss   Intervention Weight Management: Develop a combined nutrition and exercise program designed to reach desired caloric intake, while maintaining appropriate intake of nutrient and fiber, sodium and fats, and appropriate energy expenditure required for the weight goal.;Weight Management: Provide education and appropriate resources to help participant work on and attain dietary goals.;Weight Management/Obesity: Establish reasonable short term and long term weight goals.  Ms Kassa will  meet with the [facility] Transplant Dietitian every 6 months. She is working on drinking more water and decrease soda intake.   Admit Weight 180 lb 12.8 oz (82 kg)   Goal Weight: Short Term 170 lb (77.1 kg)   Goal Weight: Long Term 140 lb (63.5 kg)   Expected Outcomes Short Term: Continue to assess and modify interventions until short term weight is achieved;Long Term: Adherence to nutrition and physical activity/exercise program aimed toward attainment of established weight goal;Weight Loss: Understanding of general recommendations for a balanced deficit meal plan, which promotes 1-2 lb weight loss per week and includes a negative energy balance of [phone] kcal/d;Understanding recommendations for meals to include 15-35% energy as protein, 25-35% energy from fat, 35-60% energy from carbohydrates, less than 236m of dietary cholesterol,  20-35 gm of total fiber daily;Understanding of distribution of calorie intake throughout the day with the consumption of 4-5 meals/snacks;Weight Maintenance: Understanding of the daily nutrition guidelines, which includes 25-35% calories from fat, 7% or less cal from saturated fats, less than 222m cholesterol, less than 1.5gm of sodium, & 5 or more servings of fruits and vegetables daily   Sedentary Yes   Intervention Provide advice, education, support and counseling about physical activity/exercise needs.;Develop an individualized exercise prescription for aerobic and resistive training based on initial evaluation findings, risk stratification, comorbidities and participant's personal goals.   Expected Outcomes Achievement of increased cardiorespiratory fitness and enhanced flexibility, muscular endurance and strength shown through measurements of functional capacity and personal statement of participant.   Increase Strength and Stamina Yes   Intervention Provide advice, education, support and counseling about physical activity/exercise needs.;Develop an individualized exercise  prescription for aerobic and resistive training based on initial evaluation findings, risk stratification, comorbidities and participant's personal goals.   Expected Outcomes Achievement of increased cardiorespiratory fitness and enhanced flexibility, muscular endurance and strength shown through measurements of functional capacity and personal statement of participant.   Improve shortness of breath with ADL's Yes   Intervention Provide education, individualized exercise plan and daily activity instruction to help decrease symptoms of SOB with activities of daily living.   Expected Outcomes Short Term: Achieves a reduction of symptoms when performing activities of daily living.   Develop more efficient breathing techniques such as purse lipped breathing and diaphragmatic breathing; and practicing self-pacing with activity Yes   Intervention Provide education, demonstration and support about specific breathing techniuqes utilized for more efficient breathing. Include techniques such as pursed lipped breathing, diaphragmatic breathing and self-pacing activity.   Expected Outcomes Short Term: Participant will be able to demonstrate and use breathing techniques as needed throughout daily activities.   Increase knowledge of respiratory medications and ability to use respiratory devices properly  Yes  Ms MPlotnertakes OPrichard She is having nausea with the drug, but she is determined to continue on the medication.    Intervention Provide education and demonstration as needed of appropriate use of medications, inhalers, and oxygen therapy.   Expected Outcomes Short Term: Achieves understanding of medications use. Understands that oxygen is a medication prescribed by physician. Demonstrates appropriate use of inhaler and oxygen therapy.   Lipids Yes   Intervention Provide education and support for participant on nutrition & aerobic/resistive exercise along with prescribed medications to achieve LDL <730m HDL >4037m   Expected Outcomes Short Term: Participant states understanding of desired cholesterol values and is compliant with medications prescribed. Participant is following exercise prescription and nutrition guidelines.;Long Term: Cholesterol controlled with medications as prescribed, with individualized exercise RX and with personalized nutrition plan. Value goals: LDL < 81m48mDL > 40 mg.      Core Components/Risk Factors/Patient Goals Review:      Goals and Risk Factor Review    Row Name 10/10/16 1233 10/24/16 0830           Core Components/Risk Factors/Patient Goals Review   Personal Goals Review Develop more efficient breathing techniques such as purse lipped breathing and diaphragmatic breathing and practicing self-pacing with activity. Sedentary;Increase Strength and Stamina;Develop more efficient breathing techniques such as purse lipped breathing and diaphragmatic breathing and practicing self-pacing with activity.;Improve shortness of breath with ADL's;Weight Management/Obesity      Review Pursed lip breathing technique was discussed with the patient. The patient demonstrated understanding of this technique and when to use it during  exercise and ADL's.  Ms Vanderhoof has a goal of at least 9lbs loss and does meet with the [place] transplant dietitian. She is compliant with her OFEV for her pulmonary fibrosis and her statin drug for her lipids. Due to a $20 dollar copay, we are planning to discharge her from [facility] and have her continue her exercise in [employer].      Expected Outcomes Patient will use PLB during exercise and ADL's to help improve oxygen saturations and control SOB.   --         Core Components/Risk Factors/Patient Goals at Discharge (Final Review):      Goals and Risk Factor Review - 10/24/16 0830      Core Components/Risk Factors/Patient Goals Review   Personal Goals Review Sedentary;Increase Strength and Stamina;Develop more efficient breathing techniques such as purse  lipped breathing and diaphragmatic breathing and practicing self-pacing with activity.;Improve shortness of breath with ADL's;Weight Management/Obesity   Review Ms Brousseau has a goal of at least 9lbs loss and does meet with the [place] transplant dietitian. She is compliant with her OFEV for her pulmonary fibrosis and her statin drug for her lipids. Due to a $20 dollar copay, we are planning to discharge her from [facility] and have her continue her exercise in [employer].      ITP Comments:   Comments: 30 day note review

## 2016-11-04 ENCOUNTER — Encounter: Payer: Medicare Other | Attending: Pulmonary Disease

## 2016-11-04 DIAGNOSIS — J84112 Idiopathic pulmonary fibrosis: Secondary | ICD-10-CM | POA: Insufficient documentation

## 2016-11-08 ENCOUNTER — Encounter: Payer: Self-pay | Admitting: Respiratory Therapy

## 2016-11-08 ENCOUNTER — Telehealth: Payer: Self-pay | Admitting: Respiratory Therapy

## 2016-11-08 DIAGNOSIS — J8410 Pulmonary fibrosis, unspecified: Secondary | ICD-10-CM

## 2016-11-09 ENCOUNTER — Other Ambulatory Visit: Payer: Self-pay | Admitting: Family Medicine

## 2016-11-17 ENCOUNTER — Encounter: Payer: Self-pay | Admitting: *Deleted

## 2016-11-18 ENCOUNTER — Ambulatory Visit: Payer: Medicare Other | Admitting: Anesthesiology

## 2016-11-18 ENCOUNTER — Encounter: Payer: Self-pay | Admitting: *Deleted

## 2016-11-18 ENCOUNTER — Encounter
Admission: RE | Disposition: A | Discharge status: Home / Self Care | Payer: Self-pay | Source: Ambulatory Visit | Attending: Gastroenterology

## 2016-11-18 ENCOUNTER — Other Ambulatory Visit: Payer: Self-pay | Admitting: Family Medicine

## 2016-11-18 ENCOUNTER — Ambulatory Visit
Admission: RE | Admit: 2016-11-18 | Discharge: 2016-11-18 | Disposition: A | Discharge status: Home / Self Care | Payer: Medicare Other | Source: Ambulatory Visit | Attending: Gastroenterology | Admitting: Gastroenterology

## 2016-11-18 DIAGNOSIS — R5382 Chronic fatigue, unspecified: Secondary | ICD-10-CM | POA: Insufficient documentation

## 2016-11-18 DIAGNOSIS — Z8371 Family history of colonic polyps: Secondary | ICD-10-CM | POA: Insufficient documentation

## 2016-11-18 DIAGNOSIS — Z538 Procedure and treatment not carried out for other reasons: Secondary | ICD-10-CM | POA: Insufficient documentation

## 2016-11-18 DIAGNOSIS — E785 Hyperlipidemia, unspecified: Secondary | ICD-10-CM | POA: Diagnosis not present

## 2016-11-18 DIAGNOSIS — M797 Fibromyalgia: Secondary | ICD-10-CM | POA: Diagnosis not present

## 2016-11-18 DIAGNOSIS — E039 Hypothyroidism, unspecified: Secondary | ICD-10-CM | POA: Diagnosis not present

## 2016-11-18 DIAGNOSIS — K219 Gastro-esophageal reflux disease without esophagitis: Secondary | ICD-10-CM | POA: Diagnosis not present

## 2016-11-18 DIAGNOSIS — I739 Peripheral vascular disease, unspecified: Secondary | ICD-10-CM | POA: Diagnosis not present

## 2016-11-18 DIAGNOSIS — J449 Chronic obstructive pulmonary disease, unspecified: Secondary | ICD-10-CM | POA: Diagnosis not present

## 2016-11-18 DIAGNOSIS — I10 Essential (primary) hypertension: Secondary | ICD-10-CM | POA: Diagnosis not present

## 2016-11-18 DIAGNOSIS — R1013 Epigastric pain: Secondary | ICD-10-CM | POA: Diagnosis present

## 2016-11-18 HISTORY — DX: Pulmonary fibrosis, unspecified: J84.10

## 2016-11-18 SURGERY — ESOPHAGOGASTRODUODENOSCOPY (EGD) WITH PROPOFOL
Anesthesia: General

## 2016-11-18 MED ORDER — IPRATROPIUM-ALBUTEROL 0.5-2.5 (3) MG/3ML IN SOLN
RESPIRATORY_TRACT | Status: AC
  Administered 2016-11-18: 3 mL via RESPIRATORY_TRACT
  Filled 2016-11-18: qty 3

## 2016-11-18 MED ORDER — SODIUM CHLORIDE 0.9 % IV SOLN
INTRAVENOUS | Status: DC
  Administered 2016-11-18: 1000 mL via INTRAVENOUS

## 2016-11-18 NOTE — Telephone Encounter (Signed)
Last refill 08/15/16 #60 +1, last OV 1027/17. Ok to refill?

## 2016-11-18 NOTE — Telephone Encounter (Signed)
Called into pharmacy

## 2016-11-18 NOTE — Anesthesia Preprocedure Evaluation (Deleted)
Anesthesia Evaluation  Patient identified by MRN, date of birth, ID band Patient awake    Reviewed: Allergy & Precautions, H&P , NPO status , Patient's Chart, lab work & pertinent test results  Airway Mallampati: III  TM Distance: <3 FB Neck ROM: limited    Dental  (+) Poor Dentition, Chipped   Pulmonary shortness of breath and with exertion, pneumonia, resolved, COPD, former smoker,    Pulmonary exam normal breath sounds clear to auscultation       Cardiovascular Exercise Tolerance: Good hypertension, (-) angina+ Peripheral Vascular Disease and + DOE  (-) Past MI Normal cardiovascular exam Rhythm:regular Rate:Normal     Neuro/Psych  Headaches, PSYCHIATRIC DISORDERS Anxiety  Neuromuscular disease    GI/Hepatic Neg liver ROS, GERD  Controlled,  Endo/Other  Hypothyroidism   Renal/GU negative Renal ROS  negative genitourinary   Musculoskeletal  (+) Arthritis , Fibromyalgia -  Abdominal   Peds  Hematology negative hematology ROS (+)   Anesthesia Other Findings Past Medical History: No date: Arrhythmia     Comment: afib No date: Cancer (HCC)     Comment: uterine No date: Fibromyalgia     Comment: Chronic fatigue No date: GERD (gastroesophageal reflux disease) No date: Headache(784.0) No date: History of headache No date: History of low back pain No date: Hyperlipidemia No date: Hypertension No date: Hypothyroidism No date: Idiopathic pulmonary fibrosis (HCC) No date: Lyme disease No date: Pulmonary fibrosis (HCC) No date: PVD (peripheral vascular disease) (HCC) No date: Strain, cervical No date: Thrombosis No date: Tobacco use  Past Surgical History: No date: ABDOMINAL HYSTERECTOMY 1990: BREAST BIOPSY Left No date: LUMBAR SPINE SURGERY No date: OVARY SURGERY No date: TENDON REPAIR Right     Comment: Hand No date: TONSILLECTOMY No date: TUBAL LIGATION Bilateral No date: ULNAR NERVE TRANSPOSITION  Left No date: VAGINA SURGERY     Comment: biopsy No date: VULVECTOMY PARTIAL  BMI    Body Mass Index:  31.04 kg/m      Reproductive/Obstetrics negative OB ROS                             Anesthesia Physical Anesthesia Plan  ASA: III  Anesthesia Plan: General   Post-op Pain Management:    Induction:   Airway Management Planned:   Additional Equipment:   Intra-op Plan:   Post-operative Plan:   Informed Consent: I have reviewed the patients History and Physical, chart, labs and discussed the procedure including the risks, benefits and alternatives for the proposed anesthesia with the patient or authorized representative who has indicated his/her understanding and acceptance.   Dental Advisory Given  Plan Discussed with: Anesthesiologist, CRNA and Surgeon  Anesthesia Plan Comments:         Anesthesia Quick Evaluation

## 2016-11-21 ENCOUNTER — Encounter: Payer: Self-pay | Admitting: Respiratory Therapy

## 2016-11-21 ENCOUNTER — Encounter: Payer: Self-pay | Admitting: *Deleted

## 2016-11-21 ENCOUNTER — Ambulatory Visit: Payer: Medicare Other | Admitting: Anesthesiology

## 2016-11-21 ENCOUNTER — Encounter
Admission: RE | Disposition: A | Discharge status: Home / Self Care | Payer: Self-pay | Source: Ambulatory Visit | Attending: Gastroenterology

## 2016-11-21 ENCOUNTER — Telehealth: Payer: Self-pay | Admitting: Pulmonary Disease

## 2016-11-21 ENCOUNTER — Ambulatory Visit
Admission: RE | Admit: 2016-11-21 | Discharge: 2016-11-21 | Disposition: A | Discharge status: Home / Self Care | Payer: Medicare Other | Source: Ambulatory Visit | Attending: Gastroenterology | Admitting: Gastroenterology

## 2016-11-21 DIAGNOSIS — Z7982 Long term (current) use of aspirin: Secondary | ICD-10-CM | POA: Insufficient documentation

## 2016-11-21 DIAGNOSIS — Z8371 Family history of colonic polyps: Secondary | ICD-10-CM | POA: Insufficient documentation

## 2016-11-21 DIAGNOSIS — Z87891 Personal history of nicotine dependence: Secondary | ICD-10-CM | POA: Diagnosis not present

## 2016-11-21 DIAGNOSIS — I739 Peripheral vascular disease, unspecified: Secondary | ICD-10-CM | POA: Insufficient documentation

## 2016-11-21 DIAGNOSIS — M797 Fibromyalgia: Secondary | ICD-10-CM | POA: Diagnosis not present

## 2016-11-21 DIAGNOSIS — J449 Chronic obstructive pulmonary disease, unspecified: Secondary | ICD-10-CM | POA: Diagnosis not present

## 2016-11-21 DIAGNOSIS — D125 Benign neoplasm of sigmoid colon: Secondary | ICD-10-CM | POA: Insufficient documentation

## 2016-11-21 DIAGNOSIS — K219 Gastro-esophageal reflux disease without esophagitis: Secondary | ICD-10-CM | POA: Diagnosis not present

## 2016-11-21 DIAGNOSIS — K2980 Duodenitis without bleeding: Secondary | ICD-10-CM | POA: Insufficient documentation

## 2016-11-21 DIAGNOSIS — I10 Essential (primary) hypertension: Secondary | ICD-10-CM | POA: Diagnosis not present

## 2016-11-21 DIAGNOSIS — E039 Hypothyroidism, unspecified: Secondary | ICD-10-CM | POA: Diagnosis not present

## 2016-11-21 DIAGNOSIS — K621 Rectal polyp: Secondary | ICD-10-CM | POA: Insufficient documentation

## 2016-11-21 DIAGNOSIS — J8410 Pulmonary fibrosis, unspecified: Secondary | ICD-10-CM

## 2016-11-21 DIAGNOSIS — E785 Hyperlipidemia, unspecified: Secondary | ICD-10-CM | POA: Diagnosis not present

## 2016-11-21 HISTORY — PX: COLONOSCOPY WITH PROPOFOL: SHX5780

## 2016-11-21 HISTORY — PX: ESOPHAGOGASTRODUODENOSCOPY (EGD) WITH PROPOFOL: SHX5813

## 2016-11-21 SURGERY — COLONOSCOPY WITH PROPOFOL
Anesthesia: General

## 2016-11-21 MED ORDER — IPRATROPIUM-ALBUTEROL 0.5-2.5 (3) MG/3ML IN SOLN: 3.0000 mL | Freq: Once | RESPIRATORY_TRACT | Status: DC

## 2016-11-21 MED ORDER — SODIUM CHLORIDE 0.9 % IV SOLN: INTRAVENOUS | Status: DC

## 2016-11-21 MED ORDER — LIDOCAINE HCL (CARDIAC) 20 MG/ML IV SOLN
INTRAVENOUS | Status: DC | PRN
  Administered 2016-11-21: 100 mg via INTRAVENOUS

## 2016-11-21 MED ORDER — PROPOFOL 10 MG/ML IV BOLUS
INTRAVENOUS | Status: DC | PRN
  Administered 2016-11-21: 80 mg via INTRAVENOUS
  Administered 2016-11-21: 40 mg via INTRAVENOUS
  Administered 2016-11-21: 30 mg via INTRAVENOUS
  Administered 2016-11-21 (×2): 20 mg via INTRAVENOUS

## 2016-11-21 MED ORDER — FENTANYL CITRATE (PF) 100 MCG/2ML IJ SOLN
INTRAMUSCULAR | Status: DC | PRN
  Administered 2016-11-21: 50 ug via INTRAVENOUS

## 2016-11-21 MED ORDER — MIDAZOLAM HCL 2 MG/2ML IJ SOLN
INTRAMUSCULAR | Status: DC | PRN
  Administered 2016-11-21: 1 mg via INTRAVENOUS

## 2016-11-21 MED ORDER — PHENYLEPHRINE HCL 10 MG/ML IJ SOLN
INTRAMUSCULAR | Status: DC | PRN
  Administered 2016-11-21: 100 ug via INTRAVENOUS
  Administered 2016-11-21: 75 ug via INTRAVENOUS
  Administered 2016-11-21: 100 ug via INTRAVENOUS

## 2016-11-21 MED ORDER — PROPOFOL 500 MG/50ML IV EMUL
INTRAVENOUS | Status: DC | PRN
  Administered 2016-11-21: 180 ug/kg/min via INTRAVENOUS

## 2016-11-21 MED ORDER — SODIUM CHLORIDE 0.9 % IV SOLN
INTRAVENOUS | Status: DC
  Administered 2016-11-21: 1000 mL via INTRAVENOUS
  Administered 2016-11-21: 13:00:00 via INTRAVENOUS

## 2016-11-21 MED ORDER — EPHEDRINE SULFATE 50 MG/ML IJ SOLN
INTRAMUSCULAR | Status: DC | PRN
  Administered 2016-11-21: 15 mg via INTRAVENOUS

## 2016-11-21 NOTE — Brief Op Note (Signed)
Incomplete colon, reached rectosigmoid colon. Multiple polyps seen. 6 polyps retrieved, one not retrieved.

## 2016-11-21 NOTE — Progress Notes (Signed)
Pulmonary Individual Treatment Plan  Patient Details  Name: IDAMAY HOSEIN MRN: [id number] Date of Birth: Apr 29, 1951 Referring Provider:   Flowsheet Row Pulmonary Rehab from 10/04/2016 in [facility] Cardiac and Pulmonary Rehab  Referring Provider  [place]      Initial Encounter Date:  Flowsheet Row Pulmonary Rehab from 10/04/2016 in [facility] Cardiac and Pulmonary Rehab  Date  10/04/16  Referring Provider  Simonds      Visit Diagnosis: Pulmonary fibrosis ([place])  Patient's Home Medications on Admission:  Current Outpatient Prescriptions:    ALPRAZolam (XANAX) 0.5 MG tablet, Take 1 tablet (0.5 mg total) by mouth 2 (two) times daily as needed for anxiety., Disp: 60 tablet, Rfl: 0   Aspirin-Caffeine (BC FAST PAIN RELIEF ARTHRITIS) 1000-65 MG PACK, Take 800 mg by mouth 4 (four) times daily. , Disp: , Rfl:    atorvastatin (LIPITOR) 40 MG tablet, TAKE 1 TABLET (40 MG TOTAL) BY MOUTH DAILY., Disp: 30 tablet, Rfl: 11   ibuprofen (ADVIL,MOTRIN) 200 MG tablet, Take 200 mg by mouth every 6 (six) hours as needed., Disp: , Rfl:    levothyroxine (SYNTHROID, LEVOTHROID) 25 MCG tablet, Take 1 tablet (25 mcg total) by mouth daily before breakfast., Disp: 90 tablet, Rfl: 1   Nintedanib (OFEV) 100 MG CAPS, Take 100 mg by mouth 2 (two) times daily., Disp: 60 capsule, Rfl: 5   pantoprazole (PROTONIX) 20 MG tablet, Take 20 mg by mouth daily., Disp: , Rfl:    sertraline (ZOLOFT) 50 MG tablet, TAKE ONE TABLET BY MOUTH DAILY, Disp: 90 tablet, Rfl: 1  Current Facility-Administered Medications:    denosumab (PROLIA) injection 60 mg, 60 mg, Subcutaneous, Once, Leone Haven, MD  Past Medical History: Past Medical History:  Diagnosis Date   Arrhythmia    afib   Cancer ([place])    uterine   Fibromyalgia    Chronic fatigue   GERD (gastroesophageal reflux disease)    Headache(784.0)    History of headache    History of low back pain    Hyperlipidemia    Hypertension    Hypothyroidism     Idiopathic pulmonary fibrosis (HCC)    Lyme disease    Pulmonary fibrosis (HCC)    PVD (peripheral vascular disease) ([place])    Strain, cervical    Thrombosis    Tobacco use     Tobacco Use: History  Smoking Status   Former Smoker   Packs/day: 0.50   Years: 45.00   Types: Cigarettes  Smokeless Tobacco   Never Used    Labs: Recent [employer] for ITP Cardiac and Pulmonary Rehab Latest Ref Rng & Units 12/15/2015 02/04/2016 02/10/2016   Cholestrol 100 - 199 mg/dL 207(H) 172 -   LDLCALC 0 - 99 mg/dL 149(H) 115(H) -   HDL >39 mg/dL 44 45 -   Trlycerides 0 - 149 mg/dL 70 62 -   Hemoglobin A1c 4.6 - 6.5 % - - 6.1       ADL UCSD:     Pulmonary Assessment Scores    Row Name 10/04/16 1213         ADL UCSD   SOB Score total 24     Rest 0     Walk 1     Stairs 3     Bath 0     Dress 1     Shop 1        Pulmonary Function Assessment:     Pulmonary Function Assessment - 10/04/16 1210  Pulmonary Function Tests   FVC% 72 %   FEV1% 79 %   FEV1/FVC Ratio 86   RV% 45 %   DLCO% 52 %     Breath   Bilateral Breath Sounds Decreased;Rales;Basilar   Shortness of Breath Yes;Limiting activity      Exercise Target Goals:    Exercise Program Goal: Individual exercise prescription set with THRR, safety & activity barriers. Participant demonstrates ability to understand and report RPE using BORG scale, to self-measure pulse accurately, and to acknowledge the importance of the exercise prescription.  Exercise Prescription Goal: Starting with aerobic activity 30 plus minutes a day, 3 days per week for initial exercise prescription. Provide home exercise prescription and guidelines that participant acknowledges understanding prior to discharge.  Activity Barriers & Risk Stratification:     Activity Barriers & Cardiac Risk Stratification - 10/04/16 1209      Activity Barriers & Cardiac Risk Stratification   Activity Barriers Back  Problems;Deconditioning;Joint Problems   Cardiac Risk Stratification Moderate      6 Minute Walk:     6 Minute Walk    Row Name 10/04/16 1220         6 Minute Walk   Distance 1450 feet     Walk Time 6 minutes     # of Rest Breaks 0     MPH 2.75     METS 3.3     RPE 8     Perceived Dyspnea  2     VO2 Peak 11.55     Symptoms No     Resting HR 73 bpm     Resting BP 112/62     Max Ex. HR 106 bpm     Max Ex. BP 142/74       Interval HR   Baseline HR 73     1 Minute HR 101     2 Minute HR 105     3 Minute HR 101     4 Minute HR 106     5 Minute HR 102     6 Minute HR 101     2 Minute Post HR 80     Interval Heart Rate? Yes       Interval Oxygen   Interval Oxygen? Yes     Baseline Oxygen Saturation % 97 %     1 Minute Oxygen Saturation % 94 %     2 Minute Oxygen Saturation % 92 %     3 Minute Oxygen Saturation % 95 %     4 Minute Oxygen Saturation % 92 %     5 Minute Oxygen Saturation % 91 %     6 Minute Oxygen Saturation % 94 %     2 Minute Post Oxygen Saturation % 97 %        Initial Exercise Prescription:     Initial Exercise Prescription - 10/04/16 1200      Date of Initial Exercise RX and Referring Provider   Date 10/04/16   Referring Provider Simonds     Treadmill   MPH 2   Grade 2.5   Minutes 15   METs 3     Elliptical   Level 1   Speed 2.5   Minutes 15   METs 3     Prescription Details   Frequency (times per week) 3   Duration Progress to 45 minutes of aerobic exercise without signs/symptoms of physical distress     Intensity   THRR 40-80% of Max  Heartrate 106-139   Ratings of Perceived Exertion 11-13   Perceived Dyspnea 0-4     Progression   Progression Continue to progress workloads to maintain intensity without signs/symptoms of physical distress.     Resistance Training   Training Prescription Yes   Weight 3   Reps 10-15      Perform Capillary Blood Glucose checks as needed.  Exercise Prescription Changes:      Exercise Prescription Changes    Row Name 10/10/16 1200 10/25/16 1500           Exercise Review   Progression  -- Yes        Response to Exercise   Blood Pressure (Admit) 130/70 132/78      Blood Pressure (Exercise)  -- 152/82      Blood Pressure (Exit) 124/72 118/80      Heart Rate (Admit) 72 bpm 82 bpm      Heart Rate (Exercise) 122 bpm 155 bpm      Heart Rate (Exit) 81 bpm 80 bpm      Oxygen Saturation (Admit) 98 % 96 %      Oxygen Saturation (Exercise) 92 % 90 %      Oxygen Saturation (Exit) 96 % 97 %      Rating of Perceived Exertion (Exercise) 13 12      Perceived Dyspnea (Exercise) 3 3      Duration  -- Progress to 45 minutes of aerobic exercise without signs/symptoms of physical distress      Intensity  -- THRR unchanged        Resistance Training   Training Prescription Yes Yes      Weight 3 3      Reps 10-15 10-15        Interval Training   Interval Training  -- No        Treadmill   MPH 2 2.5      Grade 2.5 2.5      Minutes 15 15      METs 3 3.78        Elliptical   Level 1 1      Speed 2.5 3.1      Minutes 15 15      METs 3  --         Exercise Comments:     Exercise Comments    Row Name 10/10/16 1237 10/25/16 1558         Exercise Comments First full day of exercise!  Patient was oriented to gym and equipment including functions, settings, policies, and procedures.  Patient's individual exercise prescription and treatment plan were reviewed.  All starting workloads were established based on the results of the 6 minute walk test done at initial orientation visit.  The plan for exercise progression was also introduced and progression will be customized based on patient's performance and goals [place] has done well in her first weeks of exercise.         Discharge Exercise Prescription (Final Exercise Prescription Changes):     Exercise Prescription Changes - 10/25/16 1500      Exercise Review   Progression Yes     Response to Exercise    Blood Pressure (Admit) 132/78   Blood Pressure (Exercise) 152/82   Blood Pressure (Exit) 118/80   Heart Rate (Admit) 82 bpm   Heart Rate (Exercise) 155 bpm   Heart Rate (Exit) 80 bpm   Oxygen Saturation (Admit) 96 %   Oxygen Saturation (Exercise) 90 %  Oxygen Saturation (Exit) 97 %   Rating of Perceived Exertion (Exercise) 12   Perceived Dyspnea (Exercise) 3   Duration Progress to 45 minutes of aerobic exercise without signs/symptoms of physical distress   Intensity THRR unchanged     Resistance Training   Training Prescription Yes   Weight 3   Reps 10-15     Interval Training   Interval Training No     Treadmill   MPH 2.5   Grade 2.5   Minutes 15   METs 3.78     Elliptical   Level 1   Speed 3.1   Minutes 15       Nutrition:  Target Goals: Understanding of nutrition guidelines, daily intake of sodium <1545m, cholesterol <2040m calories 30% from fat and 7% or less from saturated fats, daily to have 5 or more servings of fruits and vegetables.  Biometrics:     Pre Biometrics - 10/04/16 1213      Pre Biometrics   Height 5' 3.5" (1.613 m)   Weight 180 lb 12.8 oz (82 kg)   Waist Circumference 38.75 inches   Hip Circumference 43.5 inches   Waist to Hip Ratio 0.89 %   BMI (Calculated) 31.6       Nutrition Therapy Plan and Nutrition Goals:   Nutrition Discharge: Rate Your Plate Scores:   Psychosocial: Target Goals: Acknowledge presence or absence of depression, maximize coping skills, provide positive support system. Participant is able to verbalize types and ability to use techniques and skills needed for reducing stress and depression.  Initial Review & Psychosocial Screening:     Initial Psych Review & Screening - 10/04/16 [place]   Comments Ms Mo[place]wells excited to start [place] has been recently diagnoised with Pulmonary Fibrosis and wants to learn all she can about the disease. [facility] and two children.excellent support from from her      Barriers   Psychosocial barriers to participate in program The patient should benefit from training in stress management and relaxation.     Screening Interventions   Interventions Encouraged to exercise;Other (comment)   Comments Ms MoTijerinos required to meet with a psychologist by the Transplant Team.      Quality of Life Scores:     Quality of Life - 10/04/16 1233      Quality of Life Scores   Health/Function Pre 20.56 %   Socioeconomic Pre 19.63 %   Psych/Spiritual Pre 21 %   Family Pre 21 %   GLOBAL Pre 20.5 %      PHQ-9: Recent Review Flowsheet Data    [facility] 10/04/2016 08/15/2016   Decreased Interest 0 1   Down, Depressed, Hopeless 0 1   PHQ - 2 Score 0 2   Altered sleeping 2 3   Tired, decreased energy 2 2   Change in appetite 0 1   Feeling bad or failure about yourself  0 1   Trouble concentrating 0 1   Moving slowly or fidgety/restless 0 1   Suicidal thoughts 0 0   PHQ-9 Score 4 11   Difficult doing work/chores Not difficult at all Somewhat difficult      Psychosocial Evaluation and Intervention:     Psychosocial Evaluation - 10/19/16 1150      Psychosocial Evaluation & Interventions   Comments Counselor  met with Ms. Dukes today for initial psychosocial evaluation.  She is a 65 year old diagnosed with IPF about 18 months ago.  She has a spouse of 69 years; an adult son and daughter who live close by; as well as a sister and "lots of cousins."  She has some difficulty sleeping with negative side effects of a medication that results in diarrhea.  Her Dr. is changing this to hopefully a new medication to help with these negative side effects.  She reports a good appetite and no history of depression or anxiety or any current symptoms.  She stated her mood is typically positive; but her Dr. mentioned her "seeing a  psychologist" to help with coping with IPF and the impact it has on her and her family.  These are also her primary stressors; as well as finances at this time.  Ms. Leavens desires to be stronger and more prepared to live with this diagnosis in every way.  She wants to increase her activity level and is [facility].  Staff wil be following with Ms. M throughout the course of this program.       Psychosocial Re-Evaluation:     Psychosocial Re-Evaluation    Row Name 10/19/16 1151 10/24/16 1112           Psychosocial Re-Evaluation   Comments Follow up with Ms. Jerilynn Mages stating she is happy to finally be "moving and doing something" compared to her life before this class.  She is sleeping more soundly now; but still gets up and down with the medication side effects of diarrhea.  She states the holidays will be full of losses with her parents both dying over the past few years; but she chooses to focus on the good memories and building a house at the [place] to move into next Spring.  She plans to head to [place] over the Thanksgiving holidays and is looking forward to this time with family.   Counselor follow up with Ms. Jerilynn Mages stating she is doing much better now that the Dr. has allowed her to temporarily stop the medication that was causing severe diarrhea.  She has been instructed to start back in a few days taking only 1/2 dose at that time.  She reports having more energy and sleeping better as a result.  She has increased her stamina since coming into this program and reports more energy recently. She continues to be in a positive mood and reports minimal stress at this time. Counselor will continue to follow with Ms. Jerilynn Mages.        Education: Education Goals: Education classes will be provided on a weekly basis, covering required topics. Participant will state understanding/return demonstration of topics presented.  Learning  Barriers/Preferences:     Learning Barriers/Preferences - 10/04/16 1210      Learning Barriers/Preferences   Learning Barriers None   Learning Preferences None      Education Topics: Initial Evaluation Education: - Verbal, written and demonstration of respiratory meds, RPE/PD scales, oximetry and breathing techniques. Instruction on use of nebulizers and MDIs: cleaning and proper use, rinsing mouth with steroid doses and importance of monitoring MDI activations. Flowsheet Row Pulmonary Rehab from 10/24/2016 in [facility] Cardiac and Pulmonary Rehab  Date  10/04/16  Educator  LB  Instruction Review Code  2- meets goals/outcomes      General Nutrition Guidelines/Fats and Fiber: -Group instruction provided by verbal, written material, models  and posters to present the general guidelines for heart healthy nutrition. Gives an explanation and review of dietary fats and fiber. Flowsheet Row Pulmonary Rehab from 10/24/2016 in [facility] Cardiac and Pulmonary Rehab  Date  10/24/16  Educator  Cr  Instruction Review Code  2- meets goals/outcomes      Controlling Sodium/Reading Food Labels: -Group verbal and written material supporting the discussion of sodium use in heart healthy nutrition. Review and explanation with models, verbal and written materials for utilization of the food label.   Exercise Physiology & Risk Factors: - Group verbal and written instruction with models to review the exercise physiology of the cardiovascular system and associated critical values. Details cardiovascular disease risk factors and the goals associated with each risk factor. Flowsheet Row Pulmonary Rehab from 10/24/2016 in [facility] Cardiac and Pulmonary Rehab  Date  10/19/16  Educator  AS  Instruction Review Code  2- meets goals/outcomes      Aerobic Exercise & Resistance Training: - Gives group verbal and written discussion on the health impact of inactivity. On the components of aerobic and resistive training  programs and the benefits of this training and how to safely progress through these programs.   Flexibility, Balance, General Exercise Guidelines: - Provides group verbal and written instruction on the benefits of flexibility and balance training programs. Provides general exercise guidelines with specific guidelines to those with heart or lung disease. Demonstration and skill practice provided.   Stress Management: - Provides group verbal and written instruction about the health risks of elevated stress, cause of high stress, and healthy ways to reduce stress.   Depression: - Provides group verbal and written instruction on the correlation between heart/lung disease and depressed mood, treatment options, and the stigmas associated with seeking treatment.   Exercise & Equipment Safety: - Individual verbal instruction and demonstration of equipment use and safety with use of the equipment. Flowsheet Row Pulmonary Rehab from 10/24/2016 in [facility] Cardiac and Pulmonary Rehab  Date  10/10/16  Educator  AS  Instruction Review Code  2- meets goals/outcomes      Infection Prevention: - Provides verbal and written material to individual with discussion of infection control including proper hand washing and proper equipment cleaning during exercise session. Flowsheet Row Pulmonary Rehab from 10/24/2016 in [facility] Cardiac and Pulmonary Rehab  Date  10/10/16  Educator  AS  Instruction Review Code  2- meets goals/outcomes      Falls Prevention: - Provides verbal and written material to individual with discussion of falls prevention and safety. Flowsheet Row Pulmonary Rehab from 10/24/2016 in [facility] Cardiac and Pulmonary Rehab  Date  10/04/16  Educator  LB  Instruction Review Code  2- meets goals/outcomes      Diabetes: - Individual verbal and written instruction to review signs/symptoms of diabetes, desired ranges of glucose level fasting, after meals and with exercise. Advice that pre and  post exercise glucose checks will be done for 3 sessions at entry of program.   Chronic Lung Diseases: - Group verbal and written instruction to review new updates, new respiratory medications, new advancements in procedures and treatments. Provide informative websites and "800" numbers of self-education.   Lung Procedures: - Group verbal and written instruction to describe testing methods done to diagnose lung disease. Review the outcome of test results. Describe the treatment choices: Pulmonary Function Tests, ABGs and oximetry.   Energy Conservation: - Provide group verbal and written instruction for methods to conserve energy, plan and organize activities. Instruct on pacing techniques, use  of adaptive equipment and posture/positioning to relieve shortness of breath.   Triggers: - Group verbal and written instruction to review types of environmental controls: home humidity, furnaces, filters, dust mite/pet prevention, HEPA vacuums. To discuss weather changes, air quality and the benefits of nasal washing.   Exacerbations: - Group verbal and written instruction to provide: warning signs, infection symptoms, calling MD promptly, preventive modes, and value of vaccinations. Review: effective airway clearance, coughing and/or vibration techniques. Create an Sports administrator.   Oxygen: - Individual and group verbal and written instruction on oxygen therapy. Includes supplement oxygen, available portable oxygen systems, continuous and intermittent flow rates, oxygen safety, concentrators, and Medicare reimbursement for oxygen.   Respiratory Medications: - Group verbal and written instruction to review medications for lung disease. Drug class, frequency, complications, importance of spacers, rinsing mouth after steroid MDI's, and proper cleaning methods for nebulizers.   AED/CPR: - Group verbal and written instruction with the use of models to demonstrate the basic use of the AED with the  basic ABC's of resuscitation.   Breathing Retraining: - Provides individuals verbal and written instruction on purpose, frequency, and proper technique of diaphragmatic breathing and pursed-lipped breathing. Applies individual practice skills. Flowsheet Row Pulmonary Rehab from 10/24/2016 in [facility] Cardiac and Pulmonary Rehab  Date  10/10/16  Educator  AS  Instruction Review Code  2- meets goals/outcomes      Anatomy and Physiology of the Lungs: - Group verbal and written instruction with the use of models to provide basic lung anatomy and physiology related to function, structure and complications of lung disease.   Heart Failure: - Group verbal and written instruction on the basics of heart failure: signs/symptoms, treatments, explanation of ejection fraction, enlarged heart and cardiomyopathy.   Sleep Apnea: - Individual verbal and written instruction to review Obstructive Sleep Apnea. Review of risk factors, methods for diagnosing and types of masks and machines for OSA.   Anxiety: - Provides group, verbal and written instruction on the correlation between heart/lung disease and anxiety, treatment options, and management of anxiety.   Relaxation: - Provides group, verbal and written instruction about the benefits of relaxation for patients with heart/lung disease. Also provides patients with examples of relaxation techniques.   Knowledge Questionnaire Score:     Knowledge Questionnaire Score - 10/04/16 1210      Knowledge Questionnaire Score   Pre Score 4/10       Core Components/Risk Factors/Patient Goals at Admission:     Personal Goals and Risk Factors at Admission - 10/04/16 1218      Core Components/Risk Factors/Patient Goals on Admission    Weight Management Yes;Weight Loss   Intervention Weight Management: Develop a combined nutrition and exercise program designed to reach desired caloric intake, while maintaining appropriate intake of nutrient and fiber,  sodium and fats, and appropriate energy expenditure required for the weight goal.;Weight Management: Provide education and appropriate resources to help participant work on and attain dietary goals.;Weight Management/Obesity: Establish reasonable short term and long term weight goals.  Ms Wilhide will meet with the [facility] Transplant Dietitian every 6 months. She is working on drinking more water and decrease soda intake.   Admit Weight 180 lb 12.8 oz (82 kg)   Goal Weight: Short Term 170 lb (77.1 kg)   Goal Weight: Long Term 140 lb (63.5 kg)   Expected Outcomes Short Term: Continue to assess and modify interventions until short term weight is achieved;Long Term: Adherence to nutrition and physical activity/exercise program aimed toward attainment of  established weight goal;Weight Loss: Understanding of general recommendations for a balanced deficit meal plan, which promotes 1-2 lb weight loss per week and includes a negative energy balance of [id number] kcal/d;Understanding recommendations for meals to include 15-35% energy as protein, 25-35% energy from fat, 35-60% energy from carbohydrates, less than 237m of dietary cholesterol, 20-35 gm of total fiber daily;Understanding of distribution of calorie intake throughout the day with the consumption of 4-5 meals/snacks;Weight Maintenance: Understanding of the daily nutrition guidelines, which includes 25-35% calories from fat, 7% or less cal from saturated fats, less than 2043mcholesterol, less than 1.5gm of sodium, & 5 or more servings of fruits and vegetables daily   Sedentary Yes   Intervention Provide advice, education, support and counseling about physical activity/exercise needs.;Develop an individualized exercise prescription for aerobic and resistive training based on initial evaluation findings, risk stratification, comorbidities and participant's personal goals.   Expected Outcomes Achievement of increased cardiorespiratory fitness and enhanced  flexibility, muscular endurance and strength shown through measurements of functional capacity and personal statement of participant.   Increase Strength and Stamina Yes   Intervention Provide advice, education, support and counseling about physical activity/exercise needs.;Develop an individualized exercise prescription for aerobic and resistive training based on initial evaluation findings, risk stratification, comorbidities and participant's personal goals.   Expected Outcomes Achievement of increased cardiorespiratory fitness and enhanced flexibility, muscular endurance and strength shown through measurements of functional capacity and personal statement of participant.   Improve shortness of breath with ADL's Yes   Intervention Provide education, individualized exercise plan and daily activity instruction to help decrease symptoms of SOB with activities of daily living.   Expected Outcomes Short Term: Achieves a reduction of symptoms when performing activities of daily living.   Develop more efficient breathing techniques such as purse lipped breathing and diaphragmatic breathing; and practicing self-pacing with activity Yes   Intervention Provide education, demonstration and support about specific breathing techniuqes utilized for more efficient breathing. Include techniques such as pursed lipped breathing, diaphragmatic breathing and self-pacing activity.   Expected Outcomes Short Term: Participant will be able to demonstrate and use breathing techniques as needed throughout daily activities.   Increase knowledge of respiratory medications and ability to use respiratory devices properly  Yes  Ms [place] having nausea with the drug, but she is determined to continue on the medication.    Intervention Provide education and demonstration as needed of appropriate use of medications, inhalers, and oxygen therapy.   Expected Outcomes Short Term: Achieves understanding of medications  use. Understands that oxygen is a medication prescribed by physician. Demonstrates appropriate use of inhaler and oxygen therapy.   Lipids Yes   Intervention Provide education and support for participant on nutrition & aerobic/resistive exercise along with prescribed medications to achieve LDL <7071mHDL >17m38m Expected Outcomes Short Term: Participant states understanding of desired cholesterol values and is compliant with medications prescribed. Participant is following exercise prescription and nutrition guidelines.;Long Term: Cholesterol controlled with medications as prescribed, with individualized exercise RX and with personalized nutrition plan. Value goals: LDL < 70mg42mL > 40 mg.      Core Components/Risk Factors/Patient Goals Review:      Goals and Risk Factor Review    Row Name 10/10/16 1233 10/24/16 0830           Core Components/Risk Factors/Patient Goals Review   Personal Goals Review Develop more efficient breathing techniques such as purse lipped breathing and diaphragmatic breathing and practicing self-pacing with activity. Sedentary;Increase  Strength and Stamina;Develop more efficient breathing techniques such as purse lipped breathing and diaphragmatic breathing and practicing self-pacing with activity.;Improve shortness of breath with ADL's;Weight Management/Obesity      Review Pursed lip breathing technique was discussed with the patient. The patient demonstrated understanding of this technique and when to use it during exercise and ADL's.  Ms Wertenberger has a goal of at least 9lbs loss and does meet with the [place] transplant dietitian. She is compliant with her OFEV for her pulmonary fibrosis and her statin drug for her lipids. Due to a $20 dollar copay, we are planning to discharge her from [place] and have her continue her exercise in [employer].      Expected Outcomes Patient will use PLB during exercise and ADL's to help improve oxygen saturations and control SOB.   --          Core Components/Risk Factors/Patient Goals at Discharge (Final Review):      Goals and Risk Factor Review - 10/24/16 0830      Core Components/Risk Factors/Patient Goals Review   Personal Goals Review Sedentary;Increase Strength and Stamina;Develop more efficient breathing techniques such as purse lipped breathing and diaphragmatic breathing and practicing self-pacing with activity.;Improve shortness of breath with ADL's;Weight Management/Obesity   Review Ms Susan has a goal of at least 9lbs loss and does meet with the [place] transplant dietitian. She is compliant with her OFEV for her pulmonary fibrosis and her statin drug for her lipids. Due to a $20 dollar copay, we are planning to discharge her from [place] and have her continue her exercise in [employer].      ITP Comments:     ITP Comments    Row Name 11/08/16 1350           ITP Comments Called Ms Hautala; last attended 10/24/16. She has had adverse results from her new medication , Ofev. She plans to return to [place] in a few           Comments: 30 day note review

## 2016-11-21 NOTE — Anesthesia Postprocedure Evaluation (Signed)
Anesthesia Post Note  Patient: SARAIYA BEAGAN  Procedure(s) Performed: Procedure(s) (LRB): COLONOSCOPY WITH PROPOFOL (N/A) ESOPHAGOGASTRODUODENOSCOPY (EGD) WITH PROPOFOL (N/A)  Patient location during evaluation: PACU Anesthesia Type: General Level of consciousness: awake Pain management: pain level controlled Vital Signs Assessment: post-procedure vital signs reviewed and stable Respiratory status: spontaneous breathing Cardiovascular status: stable Anesthetic complications: no     Last Vitals:  Vitals:   11/21/16 1133 11/21/16 1350  BP: 137/87   Pulse: 81   Resp: 18   Temp: 36.6 C 36.1 C    Last Pain:  Vitals:   11/21/16 1350  TempSrc: Tympanic                 VAN STAVEREN,Zarin Knupp

## 2016-11-21 NOTE — Op Note (Addendum)
[facility] Patient Name: Kelli Morris Procedure Date: 11/21/2016 12:45 PM MRN: [id number] Account #: [id number] Date of Birth: [birth year] Admit Type: Outpatient Age: 65 Room: [facility] ENDO ROOM 1 Gender: Female Note Status: Finalized Procedure:            Upper GI endoscopy Indications:          Epigastric abdominal pain Providers:            Lollie Sails, MD Referring MD:         Lenox Ahr (Referring MD), Charlott Holler. Nicki Reaper                        (Referring MD) Medicines:            Monitored Anesthesia Care Complications:        No immediate complications. Procedure:            Pre-Anesthesia Assessment:                       - ASA Grade Assessment: III - A patient with severe                        systemic disease.                       After obtaining informed consent, the endoscope was                        passed under direct vision. Throughout the procedure,                        the patient's blood pressure, pulse, and oxygen                        saturations were monitored continuously. The Endoscope                        was introduced through the mouth, and advanced to the                        duodenal bulb. The upper GI endoscopy was accomplished                        without difficulty. The patient tolerated the procedure                        well. Findings:      The Z-line was variable. Biopsies were taken with a cold forceps for       histology.      The exam of the esophagus was otherwise normal.      The entire examined stomach was normal. Biopsies were taken with a cold       forceps for histology. Biopsies were taken with a cold forceps for       Helicobacter pylori testing.      Patchy mild inflammation characterized by erythema was found in the       duodenal bulb.      There is a posterior bulbar/proximal second portion stenosis with mild       inflammation at the site. 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 I was unable to pass the       scope beyond into the second and third portion of the duodenum. There is       the appearance of possible previous duodenal ulcer disease.      The cardia and gastric fundus were normal on retroflexion. Impression:           - Z-line variable. Biopsied.                       - Normal stomach. Biopsied.                       - Duodenitis. Recommendation:       - Do an upper GI series at appointment to be scheduled.                       - Use Protonix (pantoprazole) 40 mg PO BID daily. Procedure Code(s):    --- Professional ---                       [phone], Esophagogastroduodenoscopy, flexible, transoral;                        with biopsy, single or multiple CPT copyright 2016 American Medical Association. All rights reserved. The codes documented in this report are preliminary and upon coder review may  be revised to meet current compliance requirements. Lollie Sails, MD 11/21/2016 1:06:22 PM This report has been signed electronically. Number of Addenda: 0 Note Initiated On: 11/21/2016 12:45 PM      [facility]

## 2016-11-21 NOTE — H&P (Signed)
Outpatient short stay form Pre-procedure 11/21/2016 12:19 PM Kelli Sails MD  Primary Physician: Dr. Einar Pheasant  Reason for visit:  EGD and colonoscopy  History of present illness:  Patient is a 65 year old female presenting today as above. She has a history of a flexible sigmoidoscopy about 25 years ago but no full colonoscopy. She's never had an EGD as well. She does take BC powders daily and is also presenting with persistent dyspepsia and epigastric pain. She presented last Friday however she had taken one of these pattern aspirin products the evening before and was delayed until today. She tolerated her prep well. She takes no thinning agents with the exception of her BC powders and 81 mg aspirin. She is been prescribed pantoprazole 40 mg daily. She was previously on omeprazole.    Current Facility-Administered Medications:  .  0.9 %  sodium chloride infusion, , Intravenous, Continuous, Kelli Sails, MD, Last Rate: 20 mL/hr at 11/21/16 1156, 1,000 mL at 11/21/16 1156 .  0.9 %  sodium chloride infusion, , Intravenous, Continuous, Kelli Sails, MD .  0.9 %  sodium chloride infusion, , Intravenous, Continuous, Kelli Sails, MD  Facility-Administered Medications Prior to Admission  Medication Dose Route Frequency Provider Last Rate Last Dose  . denosumab (PROLIA) injection 60 mg  60 mg Subcutaneous Once Leone Haven, MD       Prescriptions Prior to Admission  Medication Sig Dispense Refill Last Dose  . acetaminophen (TYLENOL) 325 MG tablet Take 650 mg by mouth every 6 (six) hours as needed.   11/21/2016 at [place] time  . ALPRAZolam (XANAX) 0.5 MG tablet Take 1 tablet (0.5 mg total) by mouth 2 (two) times daily as needed for anxiety. 60 tablet 0   . Aspirin-Caffeine (BC FAST PAIN RELIEF ARTHRITIS) 1000-65 MG PACK Take 800 mg by mouth 4 (four) times daily.    Past Week at Unknown time  . atorvastatin (LIPITOR) 40 MG tablet TAKE 1 TABLET (40 MG TOTAL) BY  MOUTH DAILY. 30 tablet 11 Past Week at Unknown time  . ibuprofen (ADVIL,MOTRIN) 200 MG tablet Take 200 mg by mouth every 6 (six) hours as needed.   11/18/2016 at 0700  . levothyroxine (SYNTHROID, LEVOTHROID) 25 MCG tablet Take 1 tablet (25 mcg total) by mouth daily before breakfast. 90 tablet 1 11/17/2016 at Unknown time  . Nintedanib (OFEV) 100 MG CAPS Take 100 mg by mouth 2 (two) times daily. 60 capsule 5 Past Week at Unknown time  . pantoprazole (PROTONIX) 20 MG tablet Take 20 mg by mouth daily.   11/17/2016 at Unknown time  . sertraline (ZOLOFT) 50 MG tablet TAKE ONE TABLET BY MOUTH DAILY 90 tablet 1 11/17/2016 at Unknown time     No Known Allergies   Past Medical History:  Diagnosis Date  . Arrhythmia    afib  . Cancer ([place])    uterine  . Fibromyalgia    Chronic fatigue  . GERD (gastroesophageal reflux disease)   . Headache(784.0)   . History of headache   . History of low back pain   . Hyperlipidemia   . Hypertension   . Hypothyroidism   . Idiopathic pulmonary fibrosis ([place])   . Lyme disease   . Pulmonary fibrosis ([place])   . PVD (peripheral vascular disease) ([place])   . Strain, cervical   . Thrombosis   . Tobacco use     Review of systems:      Physical Exam    Heart and lungs: Regular  rate and rhythm without rub or gallop, lungs are bilaterally clear.    HEENT: Normocephalic atraumatic eyes are anicteric    Other:     Pertinant exam for procedure: Soft nontender nondistended bowel sounds positive normoactive.    Planned proceedures: EGD, colonoscopy and indicated procedures. I have discussed the risks benefits and complications of procedures to include not limited to bleeding, infection, perforation and the risk of sedation and the patient wishes to proceed.    Kelli Sails, MD Gastroenterology 11/21/2016  12:19 PM

## 2016-11-21 NOTE — Op Note (Signed)
[facility] Patient Name: Secret Cradle Procedure Date: 11/21/2016 12:46 PM MRN: [id number] Account #: [id number] Date of Birth: 1951-04-01 Admit Type: Outpatient Age: 65 Room: [facility] ENDO ROOM 1 Gender: Female Note Status: Finalized Procedure:            Colonoscopy Indications:          Family history of colonic polyps in a first-degree                        relative Providers:            Lollie Sails, MD Referring MD:         Einar Pheasant, MD (Referring MD) Medicines:            Monitored Anesthesia Care Complications:        No immediate complications. Procedure:            Pre-Anesthesia Assessment:                       - ASA Grade Assessment: III - A patient with severe                        systemic disease.                       After obtaining informed consent, the colonoscope was                        passed under direct vision. Throughout the procedure,                        the patient's blood pressure, pulse, and oxygen                        saturations were monitored continuously. The                        Colonoscope was introduced through the anus with the                        intention of advancing to the cecum. The scope was                        advanced to the sigmoid colon before the procedure was                        aborted. Medications were given. The colonoscopy was                        extremely difficult due to restricted mobility of the                        colon and a tortuous colon. The patient tolerated the                        procedure well. The quality of the bowel preparation                        was good. Findings:      Six sessile polyps were found in the rectum and sigmoid colon. The  polyps were 3 to 4 mm in size. Two were removed with a cold snare and       placed into one jar. The other four were removed with a cold forcep and       placed into a different jar.      The  distal sigmoid colon was significantly tortuous. I was able to       advance the scope to about 20-25cm marking on the scope, encountering a       marked tortuosity and then a turn which despite abdominal support and       movement to different positions I was unable to advance further. Impression:           - Six 3 to 4 mm polyps in the rectum and in the sigmoid                        colon.                       - Tortuous colon.                       - No specimens collected. Recommendation:       - Discharge patient to home.                       - Await pathology results.                       - Perform a virtual colonoscopy at appointment to be                        scheduled. Procedure Code(s):    --- Professional ---                       [phone], 52, Colonoscopy, flexible; diagnostic, including                        collection of specimen(s) by brushing or washing, when                        performed (separate procedure) Diagnosis Code(s):    --- Professional ---                       K62.1, Rectal polyp                       D12.5, Benign neoplasm of sigmoid colon                       Z83.71, Family history of colonic polyps                       Q43.8, Other specified congenital malformations of                        intestine CPT copyright 2016 American Medical Association. All rights reserved. The codes documented in this report are preliminary and upon coder review may  be revised to meet current compliance requirements. Lollie Sails, MD 11/21/2016 1:46:17 PM This report has been signed electronically. Number of Addenda: 0 Note Initiated On: 11/21/2016 12:46 PM Total Procedure Duration: 0 hours  24 minutes 18 seconds       [facility]

## 2016-11-21 NOTE — Telephone Encounter (Signed)
Patient called and is having problems with Ofev making her have diarreah and stomach pains. Please call patient.

## 2016-11-21 NOTE — Transfer of Care (Signed)
Immediate Anesthesia Transfer of Care Note  Patient: Kelli Morris  Procedure(s) Performed: Procedure(s): COLONOSCOPY WITH PROPOFOL (N/A) ESOPHAGOGASTRODUODENOSCOPY (EGD) WITH PROPOFOL (N/A)  Patient Location: PACU  Anesthesia Type:General  Level of Consciousness: awake  Airway & Oxygen Therapy: Patient Spontanous Breathing and Patient connected to nasal cannula oxygen  Post-op Assessment: Report given to RN and Post -op Vital signs reviewed and stable  Post vital signs: Reviewed and stable  Last Vitals:  Vitals:   11/21/16 1133  BP: 137/87  Pulse: 81  Resp: 18  Temp: 36.6 C    Last Pain:  Vitals:   11/21/16 1133  TempSrc: Tympanic         Complications: No apparent anesthesia complications

## 2016-11-21 NOTE — Telephone Encounter (Signed)
atc pt X1, line kept ringing with no vm.  Will route to [place] triage to follow up on.

## 2016-11-21 NOTE — Anesthesia Preprocedure Evaluation (Addendum)
Anesthesia Evaluation  Patient identified by MRN, date of birth, ID band Patient awake    Reviewed: Allergy & Precautions, NPO status , Patient's Chart, lab work & pertinent test results  Airway Mallampati: III       Dental  (+) Teeth Intact, Caps   Pulmonary COPD, former smoker,  Hx of idiopathic pulmonary fibrosis    + decreased breath sounds      Cardiovascular hypertension,  Rhythm:Regular     Neuro/Psych    GI/Hepatic   Endo/Other    Renal/GU      Musculoskeletal   Abdominal Normal abdominal exam  (+)   Peds  Hematology   Anesthesia Other Findings   Reproductive/Obstetrics                           Anesthesia Physical Anesthesia Plan  ASA: III  Anesthesia Plan: General   Post-op Pain Management:    Induction: Intravenous  Airway Management Planned: Natural Airway and Nasal Cannula  Additional Equipment:   Intra-op Plan:   Post-operative Plan:   Informed Consent: I have reviewed the patients History and Physical, chart, labs and discussed the procedure including the risks, benefits and alternatives for the proposed anesthesia with the patient or authorized representative who has indicated his/her understanding and acceptance.     Plan Discussed with: CRNA  Anesthesia Plan Comments:         Anesthesia Quick Evaluation

## 2016-11-21 NOTE — Anesthesia Procedure Notes (Signed)
Date/Time: 11/21/2016 12:45 PM Performed by: Allean Found Pre-anesthesia Checklist: Patient identified, Emergency Drugs available, Suction available, Patient being monitored and Timeout performed Patient Re-evaluated:Patient Re-evaluated prior to inductionOxygen Delivery Method: Nasal cannula Placement Confirmation: positive ETCO2

## 2016-11-22 ENCOUNTER — Other Ambulatory Visit: Payer: Self-pay | Admitting: Gastroenterology

## 2016-11-22 ENCOUNTER — Encounter: Payer: Self-pay | Admitting: Gastroenterology

## 2016-11-22 ENCOUNTER — Telehealth: Payer: Self-pay | Admitting: Family Medicine

## 2016-11-22 DIAGNOSIS — Z86010 Personal history of colonic polyps: Secondary | ICD-10-CM

## 2016-11-22 NOTE — Telephone Encounter (Signed)
Please advise 

## 2016-11-22 NOTE — Telephone Encounter (Signed)
Pt called and stated that she was advised by Dr. Velia Meyer and Caryl Bis both advised to stop taking aspirin. Pt stated that she has taken Celebrex before and said that it did not help much, pt stated that the aspirin is easier to take. Please advise, thank you!  Call pt @ [phone]

## 2016-11-22 NOTE — Telephone Encounter (Signed)
Spoke with pt who states she having trouble with ofev causing some diarrhea and stomach pains. Fever (unsure of how high temp has been) &  chills X 4w Pt states DS advise pt to stop ofev for 4d and then resume. Pt states she did not have any diarrhea during this time but she did still  have the stomach pain.  Pt states she had a colonoscopy yesterday, Dr. Gustavo Lah feels that this medication may be causing additional trouble to her intestines.  DS please advise. Thanks.

## 2016-11-23 ENCOUNTER — Telehealth: Payer: Self-pay | Admitting: Respiratory Therapy

## 2016-11-23 ENCOUNTER — Encounter: Payer: Self-pay | Admitting: Respiratory Therapy

## 2016-11-23 DIAGNOSIS — J8410 Pulmonary fibrosis, unspecified: Secondary | ICD-10-CM

## 2016-11-23 LAB — SURGICAL PATHOLOGY

## 2016-11-23 NOTE — Telephone Encounter (Signed)
I would advised patient to avoid Celebrex and aspirin given her history of reflux. I would suggest Tylenol for any discomfort.

## 2016-11-23 NOTE — Telephone Encounter (Signed)
Kelli Morris called today and informed us she was unable to complete the colonoscopy and will have to have a virtual colonoscopy. She plans to return the first of the year.

## 2016-11-24 ENCOUNTER — Encounter: Payer: Self-pay | Admitting: Pulmonary Disease

## 2016-11-24 ENCOUNTER — Ambulatory Visit
Admission: RE | Admit: 2016-11-24 | Discharge: 2016-11-24 | Disposition: A | Discharge status: Home / Self Care | Payer: Medicare Other | Source: Ambulatory Visit | Attending: Pulmonary Disease | Admitting: Pulmonary Disease

## 2016-11-24 ENCOUNTER — Ambulatory Visit (INDEPENDENT_AMBULATORY_CARE_PROVIDER_SITE_OTHER): Payer: Medicare Other | Admitting: Pulmonary Disease

## 2016-11-24 VITALS — BP 126/62 | HR 95 | Ht 63.5 in | Wt 185.4 lb

## 2016-11-24 DIAGNOSIS — R0602 Shortness of breath: Secondary | ICD-10-CM | POA: Diagnosis not present

## 2016-11-24 DIAGNOSIS — J84112 Idiopathic pulmonary fibrosis: Secondary | ICD-10-CM | POA: Diagnosis not present

## 2016-11-24 DIAGNOSIS — R918 Other nonspecific abnormal finding of lung field: Secondary | ICD-10-CM | POA: Insufficient documentation

## 2016-11-24 DIAGNOSIS — J9621 Acute and chronic respiratory failure with hypoxia: Secondary | ICD-10-CM | POA: Diagnosis not present

## 2016-11-24 DIAGNOSIS — I700 Atherosclerosis of aorta: Secondary | ICD-10-CM | POA: Insufficient documentation

## 2016-11-24 DIAGNOSIS — K219 Gastro-esophageal reflux disease without esophagitis: Secondary | ICD-10-CM

## 2016-11-24 MED ORDER — LEVOFLOXACIN 500 MG PO TABS: 500.0000 mg | ORAL_TABLET | Freq: Every day | ORAL | 0 refills | Status: DC

## 2016-11-24 MED ORDER — PREDNISONE 20 MG PO TABS: 40.0000 mg | ORAL_TABLET | Freq: Every day | ORAL | 0 refills | Status: DC

## 2016-11-24 NOTE — Telephone Encounter (Signed)
Pt aware of DS recommendations and voiced her understanding. Pt states she bought a pulse ox and has been checking her level. Pt states her O2 is getting as low as 69. Pt states she feels she may have the flu. Pt c/o cough, increased sob, and fever. I have scheduled pt to see DS on 11-24-16 for an acute visit @ 11:00. Nothing further needed.

## 2016-11-24 NOTE — Patient Instructions (Signed)
-   Stop Ofev - Chest Xray today - We will start oxygen therapy 2 LPM by nasal cannula as close to 24 hrs per day as possible - Prednisone 40 mg daily for 7 days (2X 20 mg) - Doxycycline 100 mg twice a day for 7 days  Follow up in 2-3 weeks

## 2016-11-24 NOTE — Progress Notes (Signed)
PULMONARY OFFICE FOLLOW UP NOTE  Requesting MD/Service: Caryl Bis, MD Date of initial consultation: 03/08/16 Reason for consultation: Pulmonary fibrosis  PT PROFILE: 65 y.o. F smoker referred 03/08/16 by Dr Caryl Bis for evaluation of pulmonary fibrosis. She initially presented with a "bad cold" in Nov 2016 with persistent symptoms into December when a CXR was performed and she was diagnosed with pneumonia. She was treated with antibiotics with improvement. However, she continued to have persistent NP cough. Because of the abnormal CXR, she underwent CT chest with findings c/w IPF. She has a history of arthritis which had been labelled as RA in the past and she was even on MTX briefly years ago. She has no rash or other skin lesions, sicca symptoms, renal disease. She has a history of prior DVT that occurred after LE injury.   DATA: 07/31/15: Right peroneal vein suspicious for intraluminal thrombus, although not well demonstrated. Otherwise, no other right lower extremity DVT. 01/14/16: No DVT on LE venous US 01/26/16 HRCT: Extensive peripheral basilar predominant reticulation, ground-glass attenuation and traction bronchiectasis in both lungs, with early honeycombing in the lower lobes. 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 Indeterminate irregular 1.2 cm nodular focus of consolidation in the basilar right lower lobe. This may represent part of the interstitial lung disease, however a true pulmonary nodule cannot be excluded.  03/08/16: Rheumatoid factor and ANA negative 04/27/16 PFTs: No obstruction, mild-mod restriction, mod decrease in DLCO 04/27/16 CXR: diffuse interstitial lung disease 08/16/16 CT chest: Right lower lobe pulmonary nodule measures slightly smaller although this difference may be technical only. Recommend an additional follow-up chest CT in 6 months to document longer term stability. Stable changes of presumed usual interstitial  pneumonia No acute findings. 08/19/16 PFTs: No obstruction, mild-mod restriction, mod decrease in DLCO - [place] from prior study 09/26/16 Lung transplant eval @ [place]. Recommend weight loss of 10-25#. Pulm rehab initiated  10/21/16 Telephone encounter: reported severe diarrhea. Recommended holding OfevX 3 days, then once a day for 7 days, then full dose of twice a day if tolerating. 11/21/16 EGD, colonoscopy: Patchy mild inflammation characterized by erythema was found in the duodenal bulb. There is a posterior bulbar/proximal second portion stenosis with mild inflammation at the site. 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 Unable to pass the scope beyond into the second and third portion of the duodenum. There is the appearance of possible previous duodenal ulcer disease Incomplete colon, reached rectosigmoid colon. Multiple polyps seen. 6 polyps retrieved, one not retrieved. 11/24/16 Acute office visit: 6-7 days of increased dyspnea, NP cough, subjective fevers and hypoxemia with SpO2 as low as 70s. Intolerant to Ofev due to GI issues  SUBJ: Acute office visit for 6-7 days of increased dyspnea, NP cough, subjective fevers and hypoxemia with SpO2 as low as 70s (she has purchased a pulse oximeter for home use. She is off of Ofev due to intolerance    Vitals:   11/24/16 1054 11/24/16 1058  BP:  126/62  Pulse:  95  SpO2: (!) 84% 94%  Weight:  185 lb 6.4 oz (84.1 kg)  Height:  5' 3.5" (1.613 m)  84% on RA 94% on 2 LPM [place]  EXAM:  Gen: Affect flat, appears fatigued, no overt respiratory distress, NAD HEENT: WNL Neck: No JVD, LAN Lungs: R>L prominent crackles, no wheezes Cardiovascular: Reg, no murmurs noted Abdomen: Soft, nontender, normal BS Ext: NO C/C/E Neuro: intact on limited exam   DATA:  BMP Latest Ref Rng & Units 06/30/2016 02/10/2016 07/31/2015  Glucose 70 - 99 mg/dL 83 91 135(H)  BUN 6 - 23 mg/dL 10 20 10   Creatinine 0.40 - 1.20 mg/dL  1.10 0.96 0.98  BUN/Creat Ratio 11 - 26 - - -  Sodium 135 - 145 mEq/L 140 135 138  Potassium 3.5 - 5.1 mEq/L 4.3 4.5 3.9  Chloride 96 - 112 mEq/L 106 101 106  CO2 19 - 32 mEq/L 28 25 25   Calcium 8.4 - 10.5 mg/dL 9.5 9.6 8.9    CBC Latest Ref Rng & Units 06/02/2016 02/10/2016 07/31/2015  WBC 4.0 - 10.5 K/uL 6.9 8.4 7.7  Hemoglobin 12.0 - 15.0 g/dL 12.5 13.2 13.3  Hematocrit 36.0 - 46.0 % 37.3 40.1 40.3  Platelets 150.0 - 400.0 K/uL 253.0 312.0 269   CXR:  Increased bilateral interstitial prominence   IMPRESSION:      1. IPF -  Not biopsy proved  2. Chronic respiratory failure, now with Acute hypoxemia  3. Gastroesophageal reflux disease  4. Ofev intolerance  I am concerned about possible superimposed infection or acute IPF flare   PLAN:  1) CXR ordered today and reviewed above 2) Prednisone 40 mg daily X 7 days 3) Levofloxacin 500 mg daily X 7 days 4) Initiate supplemental O2 - 2 LPM by [place] as close to 24 hrs per day as possible 5) ROV 2-3 weeks  Merton Border, MD PCCM service Mobile [phone] Pager [phone] 11/24/2016

## 2016-11-24 NOTE — Telephone Encounter (Signed)
Patient notified. Patient states she has been experiencing stomach pain, chills, fever and body aches. Patient states she has been trying to get in touch with her pulmonary doctor since Monday but has been unable to. She states her oxygen has been in the 70s sometimes as low as 60s. Informed patient to call ems to be evaluated, if her oxygen is that low. Patient verbalized understanding.

## 2016-11-24 NOTE — Telephone Encounter (Signed)
Hold off on Ofev until next visit. We will consider trial of Pirfenidone  Thanks  Waunita Schooner

## 2016-11-25 ENCOUNTER — Emergency Department: Payer: Medicare Other

## 2016-11-25 ENCOUNTER — Encounter: Payer: Self-pay | Admitting: Emergency Medicine

## 2016-11-25 ENCOUNTER — Telehealth: Payer: Self-pay | Admitting: Pulmonary Disease

## 2016-11-25 ENCOUNTER — Inpatient Hospital Stay
Admission: EM | Admit: 2016-11-25 | Discharge: 2016-11-27 | DRG: 196 | Disposition: A | Discharge status: Home / Self Care | Payer: Medicare Other | Attending: Internal Medicine | Admitting: Internal Medicine

## 2016-11-25 ENCOUNTER — Other Ambulatory Visit: Payer: Self-pay

## 2016-11-25 DIAGNOSIS — M810 Age-related osteoporosis without current pathological fracture: Secondary | ICD-10-CM | POA: Diagnosis present

## 2016-11-25 DIAGNOSIS — J8410 Pulmonary fibrosis, unspecified: Secondary | ICD-10-CM

## 2016-11-25 DIAGNOSIS — J9621 Acute and chronic respiratory failure with hypoxia: Secondary | ICD-10-CM | POA: Diagnosis not present

## 2016-11-25 DIAGNOSIS — I10 Essential (primary) hypertension: Secondary | ICD-10-CM | POA: Diagnosis present

## 2016-11-25 DIAGNOSIS — E785 Hyperlipidemia, unspecified: Secondary | ICD-10-CM | POA: Diagnosis present

## 2016-11-25 DIAGNOSIS — M797 Fibromyalgia: Secondary | ICD-10-CM | POA: Diagnosis present

## 2016-11-25 DIAGNOSIS — Z8249 Family history of ischemic heart disease and other diseases of the circulatory system: Secondary | ICD-10-CM

## 2016-11-25 DIAGNOSIS — E039 Hypothyroidism, unspecified: Secondary | ICD-10-CM | POA: Diagnosis present

## 2016-11-25 DIAGNOSIS — Z79899 Other long term (current) drug therapy: Secondary | ICD-10-CM | POA: Diagnosis not present

## 2016-11-25 DIAGNOSIS — Z825 Family history of asthma and other chronic lower respiratory diseases: Secondary | ICD-10-CM

## 2016-11-25 DIAGNOSIS — J189 Pneumonia, unspecified organism: Secondary | ICD-10-CM | POA: Diagnosis present

## 2016-11-25 DIAGNOSIS — J84112 Idiopathic pulmonary fibrosis: Secondary | ICD-10-CM | POA: Diagnosis present

## 2016-11-25 DIAGNOSIS — K219 Gastro-esophageal reflux disease without esophagitis: Secondary | ICD-10-CM | POA: Diagnosis present

## 2016-11-25 DIAGNOSIS — Z833 Family history of diabetes mellitus: Secondary | ICD-10-CM | POA: Diagnosis not present

## 2016-11-25 DIAGNOSIS — F329 Major depressive disorder, single episode, unspecified: Secondary | ICD-10-CM | POA: Diagnosis present

## 2016-11-25 DIAGNOSIS — Z8049 Family history of malignant neoplasm of other genital organs: Secondary | ICD-10-CM

## 2016-11-25 DIAGNOSIS — K5900 Constipation, unspecified: Secondary | ICD-10-CM | POA: Diagnosis present

## 2016-11-25 DIAGNOSIS — R0602 Shortness of breath: Secondary | ICD-10-CM | POA: Diagnosis present

## 2016-11-25 DIAGNOSIS — R0902 Hypoxemia: Secondary | ICD-10-CM

## 2016-11-25 DIAGNOSIS — K3189 Other diseases of stomach and duodenum: Secondary | ICD-10-CM | POA: Diagnosis present

## 2016-11-25 DIAGNOSIS — Z87891 Personal history of nicotine dependence: Secondary | ICD-10-CM | POA: Diagnosis not present

## 2016-11-25 LAB — CBC WITH DIFFERENTIAL/PLATELET
BASOS PCT: 0 %
Basophils Absolute: 0 10*3/uL (ref 0–0.1)
EOS PCT: 0 %
Eosinophils Absolute: 0 10*3/uL (ref 0–0.7)
HCT: 35.5 % (ref 35.0–47.0)
Hemoglobin: 11.9 g/dL — ABNORMAL LOW (ref 12.0–16.0)
LYMPHS PCT: 7 %
Lymphs Abs: 1 10*3/uL (ref 1.0–3.6)
MCH: 31.4 pg (ref 26.0–34.0)
MCHC: 33.6 g/dL (ref 32.0–36.0)
MCV: 93.5 fL (ref 80.0–100.0)
MONO ABS: 0.7 10*3/uL (ref 0.2–0.9)
Monocytes Relative: 5 %
Neutro Abs: 12.5 10*3/uL — ABNORMAL HIGH (ref 1.4–6.5)
Neutrophils Relative %: 88 %
PLATELETS: 340 10*3/uL (ref 150–440)
RBC: 3.8 MIL/uL (ref 3.80–5.20)
RDW: 13.6 % (ref 11.5–14.5)
WBC: 14.3 10*3/uL — ABNORMAL HIGH (ref 3.6–11.0)

## 2016-11-25 LAB — BASIC METABOLIC PANEL
ANION GAP: 10 (ref 5–15)
BUN: 14 mg/dL (ref 6–20)
CALCIUM: 8.8 mg/dL — AB (ref 8.9–10.3)
CO2: 23 mmol/L (ref 22–32)
CREATININE: 0.86 mg/dL (ref 0.44–1.00)
Chloride: 103 mmol/L (ref 101–111)
GFR calc Af Amer: >60 mL/min (ref >60)
GLUCOSE: 113 mg/dL — AB (ref 65–99)
Potassium: 3.6 mmol/L (ref 3.5–5.1)
Sodium: 136 mmol/L (ref 135–145)

## 2016-11-25 LAB — INFLUENZA PANEL BY PCR (TYPE A & B)
INFLAPCR: NEGATIVE
Influenza B By PCR: NEGATIVE

## 2016-11-25 LAB — BRAIN NATRIURETIC PEPTIDE: B NATRIURETIC PEPTIDE 5: 81 pg/mL (ref 0.0–100.0)

## 2016-11-25 LAB — TROPONIN I

## 2016-11-25 MED ORDER — PANTOPRAZOLE SODIUM 40 MG PO TBEC
40.0000 mg | DELAYED_RELEASE_TABLET | Freq: Two times a day (BID) | ORAL | Status: DC
  Administered 2016-11-25 – 2016-11-27 (×4): 40 mg via ORAL
  Filled 2016-11-25 (×4): qty 1

## 2016-11-25 MED ORDER — ACETAMINOPHEN 650 MG RE SUPP: 650.0000 mg | Freq: Four times a day (QID) | RECTAL | Status: DC | PRN

## 2016-11-25 MED ORDER — LEVOTHYROXINE SODIUM 25 MCG PO TABS
25.0000 ug | ORAL_TABLET | Freq: Every day | ORAL | Status: DC
  Administered 2016-11-26 – 2016-11-27 (×2): 25 ug via ORAL
  Filled 2016-11-25 (×2): qty 1

## 2016-11-25 MED ORDER — LEVOFLOXACIN IN D5W 500 MG/100ML IV SOLN
500.0000 mg | INTRAVENOUS | Status: DC
  Administered 2016-11-25 – 2016-11-26 (×2): 500 mg via INTRAVENOUS
  Filled 2016-11-25 (×3): qty 100

## 2016-11-25 MED ORDER — ALPRAZOLAM 0.5 MG PO TABS
0.5000 mg | ORAL_TABLET | Freq: Two times a day (BID) | ORAL | Status: DC | PRN
  Administered 2016-11-25 – 2016-11-26 (×2): 0.5 mg via ORAL
  Filled 2016-11-25 (×3): qty 1

## 2016-11-25 MED ORDER — METHYLPREDNISOLONE SODIUM SUCC 125 MG IJ SOLR
80.0000 mg | Freq: Once | INTRAMUSCULAR | Status: AC
  Administered 2016-11-25: 80 mg via INTRAVENOUS
  Filled 2016-11-25: qty 2

## 2016-11-25 MED ORDER — SODIUM CHLORIDE 0.9 % IV SOLN
INTRAVENOUS | Status: DC
  Administered 2016-11-25 – 2016-11-26 (×2): via INTRAVENOUS

## 2016-11-25 MED ORDER — ASPIRIN-CAFFEINE 1000-65 MG PO PACK: 800.0000 mg | PACK | Freq: Four times a day (QID) | ORAL | Status: DC

## 2016-11-25 MED ORDER — ONDANSETRON HCL 4 MG PO TABS: 4.0000 mg | ORAL_TABLET | Freq: Four times a day (QID) | ORAL | Status: DC | PRN

## 2016-11-25 MED ORDER — SERTRALINE HCL 50 MG PO TABS
50.0000 mg | ORAL_TABLET | Freq: Every day | ORAL | Status: DC
  Administered 2016-11-25 – 2016-11-27 (×3): 50 mg via ORAL
  Filled 2016-11-25 (×3): qty 1

## 2016-11-25 MED ORDER — SODIUM CHLORIDE 0.9% FLUSH
3.0000 mL | Freq: Two times a day (BID) | INTRAVENOUS | Status: DC
  Administered 2016-11-26: 3 mL via INTRAVENOUS

## 2016-11-25 MED ORDER — DENOSUMAB 60 MG/ML ~~LOC~~ SOLN: 60.0000 mg | Freq: Once | SUBCUTANEOUS | Status: DC

## 2016-11-25 MED ORDER — ALBUTEROL SULFATE (2.5 MG/3ML) 0.083% IN NEBU: 3.0000 mL | INHALATION_SOLUTION | Freq: Four times a day (QID) | RESPIRATORY_TRACT | Status: DC | PRN

## 2016-11-25 MED ORDER — FUROSEMIDE 10 MG/ML IJ SOLN
20.0000 mg | Freq: Once | INTRAMUSCULAR | Status: AC
  Administered 2016-11-25: 20 mg via INTRAVENOUS
  Filled 2016-11-25: qty 4

## 2016-11-25 MED ORDER — LEVOFLOXACIN 500 MG PO TABS: 500.0000 mg | ORAL_TABLET | Freq: Every day | ORAL | Status: DC

## 2016-11-25 MED ORDER — ACETAMINOPHEN 325 MG PO TABS
650.0000 mg | ORAL_TABLET | Freq: Four times a day (QID) | ORAL | Status: DC | PRN
  Administered 2016-11-25 – 2016-11-26 (×2): 650 mg via ORAL
  Filled 2016-11-25 (×2): qty 2

## 2016-11-25 MED ORDER — SENNOSIDES-DOCUSATE SODIUM 8.6-50 MG PO TABS: 1.0000 | ORAL_TABLET | Freq: Every evening | ORAL | Status: DC | PRN

## 2016-11-25 MED ORDER — METHYLPREDNISOLONE SODIUM SUCC 125 MG IJ SOLR
80.0000 mg | Freq: Two times a day (BID) | INTRAMUSCULAR | Status: DC
  Administered 2016-11-25 – 2016-11-27 (×4): 80 mg via INTRAVENOUS
  Filled 2016-11-25 (×4): qty 2

## 2016-11-25 MED ORDER — ENOXAPARIN SODIUM 40 MG/0.4ML ~~LOC~~ SOLN
40.0000 mg | SUBCUTANEOUS | Status: DC
  Administered 2016-11-25 – 2016-11-26 (×2): 40 mg via SUBCUTANEOUS
  Filled 2016-11-25 (×2): qty 0.4

## 2016-11-25 MED ORDER — METHYLPREDNISOLONE SODIUM SUCC 40 MG IJ SOLR: 40.0000 mg | INTRAMUSCULAR | Status: DC

## 2016-11-25 MED ORDER — ATORVASTATIN CALCIUM 20 MG PO TABS
40.0000 mg | ORAL_TABLET | Freq: Every day | ORAL | Status: DC
  Administered 2016-11-25 – 2016-11-26 (×2): 40 mg via ORAL
  Filled 2016-11-25 (×2): qty 2

## 2016-11-25 MED ORDER — ONDANSETRON HCL 4 MG/2ML IJ SOLN: 4.0000 mg | Freq: Four times a day (QID) | INTRAMUSCULAR | Status: DC | PRN

## 2016-11-25 NOTE — Telephone Encounter (Signed)
Spoke with pt, who states her oxygen level was okay last night. This morning she walked put side to get a piece of wood off of the porch and her o2 level dropped to 77 on 3L cont.  I made pt aware that there are no docs in office today. I advised pt to go the ED to be evaluated with her o2 level being that low on 3L. Pt voiced her understanding and had no further questions. Will route to DS to make him aware.

## 2016-11-25 NOTE — Consult Note (Signed)
PULMONARY CONSULT NOTE  Requesting MD/Service: Mody, [facility] Date of initial consultation: 11/25/16 Reason for consultation: Acute on chronic hypoxic respiratory failure, IPF  PT PROFILE: 16 F with presumed diagnosis of IPF (not biopsy proven) who has had a relatively stable course since diagnosis in April 2017 until past week or so. She was seen in office 11/24/16 with worsening dyspnea, hypoxemia and interstitial infiltrates on CXR. She was prescribed a course of prednisone 40 mg daily X 7 days and levofloxacin X 7 days. Oxygen therapy was initiated as outpt 12/21  01/26/16 HRCT: Extensive peripheral basilar predominant reticulation, ground-glass attenuation and traction bronchiectasis in both lungs, with early honeycombing in the lower lobes. 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 Indeterminate irregular 1.2 cm nodular focus of consolidation in the basilar right lower lobe. This may represent part of the interstitial lung disease, however a true pulmonary nodule cannot be excluded.  03/08/16: Rheumatoid factor and ANA negative 04/27/16 PFTs: No obstruction, mild-mod restriction, mod decrease in DLCO 04/27/16 CXR: diffuse interstitial lung disease 08/16/16 CT chest: Right lower lobe pulmonary nodule measures slightly smaller although this difference may be technical only. Recommend an additional follow-up chest CT in 6 months to document longer term stability. Stable changes of presumed usual interstitial pneumonia No acute findings. 08/19/16 PFTs: No obstruction, mild-mod restriction, mod decrease in DLCO - [place] from prior study 09/26/16 Lung transplant eval @ [place]. Recommend weight loss of 10-25#. Pulm rehab initiated  10/21/16 Telephone encounter: reported severe diarrhea. Recommended holding OfevX 3 days, then once a day for 7 days, then full dose of twice a day if tolerating. 11/21/16 EGD, colonoscopy: Patchy mild inflammation characterized by  erythema was found in the duodenal bulb. There is a posterior bulbar/proximal second portion stenosis with mild inflammation at the site. 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 Unable to pass the scope beyond into the second and third portion of the duodenum. There is the appearance of possible previous duodenal ulcer disease Incomplete colon, reached rectosigmoid colon. Multiple polyps seen. 6 polyps retrieved, one not retrieved. 11/24/16 Acute office visit: 6-7 days of increased dyspnea, NP cough, subjective fevers and hypoxemia with SpO2 as low as 70s. Intolerant to Ofev due to GI issues  HPI:  She presented to ED on day of admission with increased dyspnea/panting and exertional hypoxemia despite O2 @ 3 lpm by [place]. She denies CP, fever, purulent sputum, hemoptysis, LE edema and calf tenderness   Past Medical History:  Diagnosis Date  . Arrhythmia    afib  . Cancer ([place])    uterine  . Fibromyalgia    Chronic fatigue  . GERD (gastroesophageal reflux disease)   . Headache(784.0)   . History of headache   . History of low back pain   . Hyperlipidemia   . Hypertension   . Hypothyroidism   . Idiopathic pulmonary fibrosis ([place])   . Lyme disease   . Pulmonary fibrosis ([place])   . PVD (peripheral vascular disease) ([place])   . Strain, cervical   . Thrombosis   . Tobacco use     Past Surgical History:  Procedure Laterality Date  . ABDOMINAL HYSTERECTOMY    . BREAST BIOPSY Left 1990  . COLONOSCOPY WITH PROPOFOL N/A 11/21/2016   Procedure: COLONOSCOPY WITH PROPOFOL;  Surgeon: Lollie Sails, MD;  Location: [facility] ENDOSCOPY;  Service: Endoscopy;  Laterality: N/A;  . ESOPHAGOGASTRODUODENOSCOPY (EGD) WITH PROPOFOL N/A 11/21/2016   Procedure: ESOPHAGOGASTRODUODENOSCOPY (EGD) WITH  PROPOFOL;  Surgeon: Lollie Sails, MD;  Location: [facility] ENDOSCOPY;  Service: Endoscopy;  Laterality: N/A;  . LUMBAR SPINE SURGERY    . OVARY SURGERY    . TENDON  REPAIR Right    Hand  . TONSILLECTOMY    . TUBAL LIGATION Bilateral   . ULNAR NERVE TRANSPOSITION Left   . VAGINA SURGERY     biopsy  . VULVECTOMY PARTIAL      MEDICATIONS: I have reviewed all medications and confirmed regimen as documented  Social History   Social History  . Marital status: Married    Spouse name: Shanon Brow  . Number of children: 2  . Years of education: 14   Occupational History  . N/A     Disability   Social History Main Topics  . Smoking status: Former Smoker    Packs/day: 0.50    Years: 45.00    Types: Cigarettes  . Smokeless tobacco: Never Used  . Alcohol use Yes     Comment: Consumes alcohol on occasion  . Drug use: No  . Sexual activity: Not on file   Other Topics Concern  . Not on file   Social History Narrative   Patient is married for 51 years and lives with her husband Shanon Brow)   Retired    Scientist, physiological- college   Right handed.   Caffeine- sodas three daily.    Family History  Problem Relation Age of Onset  . Uterine cancer Mother   . Leukemia Mother   . Congestive Heart Failure Mother   . COPD Mother   . Diabetes Father   . Congestive Heart Failure Father   . Heart disease Maternal Grandfather   . Heart disease Paternal Grandfather   . Arthritis Brother   . Neuropathy Brother   . Breast cancer Neg Hx     ROS: No fever, myalgias/arthralgias, unexplained weight loss or weight gain No new focal weakness or sensory deficits No otalgia, hearing loss, visual changes, nasal and sinus symptoms, mouth and throat problems No neck pain or adenopathy No abdominal pain, N/V/D, diarrhea, change in bowel pattern No dysuria, change in urinary pattern   Vitals:   11/25/16 1330 11/25/16 1400 11/25/16 1430 11/25/16 1500  BP: 118/73 116/72 124/73 110/64  Pulse: 77 83 86 92  Resp: 17  (!) 22   SpO2: 93% 98% 90% (!) 87%  Weight:      Height:       3 lpm [place]  EXAM:  Gen: WDWN, No overt respiratory distress HEENT: NCAT, sclera white,  oropharynx normal Neck: Supple without LAN, thyromegaly, JVD Lungs: breath sounds with diffuse, fine crackles, no wheezes, percussion normal Cardiovascular: RRR, no murmurs noted Abdomen: Soft, nontender, normal BS Ext: without clubbing, cyanosis, edema Neuro: CNs grossly intact, motor and sensory intact Skin: Limited exam, no lesions noted  DATA:   BMP Latest Ref Rng & Units 11/25/2016 06/30/2016 02/10/2016  Glucose 65 - 99 mg/dL 113(H) 83 91  BUN 6 - 20 mg/dL 14 10 20   Creatinine 0.44 - 1.00 mg/dL 0.86 1.10 0.96  BUN/Creat Ratio 11 - 26 - - -  Sodium 135 - 145 mmol/L 136 140 135  Potassium 3.5 - 5.1 mmol/L 3.6 4.3 4.5  Chloride 101 - 111 mmol/L 103 106 101  CO2 22 - 32 mmol/L 23 28 25   Calcium 8.9 - 10.3 mg/dL 8.8(L) 9.5 9.6    CBC Latest Ref Rng & Units 11/25/2016 06/02/2016 02/10/2016  WBC 3.6 - 11.0 K/uL 14.3(H) 6.9 8.4  Hemoglobin 12.0 - 16.0 g/dL 11.9(L) 12.5 13.2  Hematocrit 35.0 - 47.0 % 35.5 37.3 40.1  Platelets 150 - 440 K/uL 340 253.0 312.0    CXR:  pending  IMPRESSION:   1) IPF - previously mild restriction on PFTs  Previously intolerant to Ofev due to GI issues 2) Acute hypoxic respiratory failure with increased interstitial markings on CXR 12/21  No evidence of pulmonary edema (BNP normal)  Likely acute exacerbation of IPF    These can be triggered by viral pulmonary infections   PLAN:  Supplemental O2 to maintain SpO2 > 90% Nasal swab for respiratory virus panel  Empiric levofloxacin  Systemic steroids - methylprednisolone 80 mg q 12 hrs Follow CXR Likely discharge to home in 2-3 days She already has follow up scheduled with me in office 12/06/16  Merton Border, MD PCCM service Mobile [phone] Pager [phone] 11/25/2016

## 2016-11-25 NOTE — H&P (Signed)
[place] [facility] [place] NAME: Kelli Morris    MR#:  [id number]  DATE OF BIRTH:  [birth year]  DATE OF ADMISSION:  11/25/2016  PRIMARY CARE PHYSICIAN: Tommi Rumps, MD   REQUESTING/REFERRING PHYSICIAN: Dr. Reita Cliche  CHIEF COMPLAINT:  Shortness of breath  HISTORY OF PRESENT ILLNESS:  Kelli Morris  is a 65 y.o. female with a known history of Idiopathic pulmonary fibrosis followed by Dr. Alva Garnet who presents with above complaint. Patient has baseline shortness of breath over over the past 2 weeks she has had increasing shortness of breath and dyspnea exertion. She says 2 weeks ago she had a 4 hour episode of nausea and vomiting. Since that time she has had achy joints, fever and chills. She received the flu vaccine in March. She does not think that she has the flu. She has no sick contacts. She saw her pulmonologist yesterday and a chest x-ray showed worsening fibrosis and possible pneumonia. She was started on oxygen, prednisone and Levaquin. Patient reports morning her shortness of breath was worse so she presented to the ER for further evaluation. She denies lower extremity edema, PND or chest pain.  PAST MEDICAL HISTORY:   Past Medical History:  Diagnosis Date  . Arrhythmia    afib  . Cancer ([place])    uterine  . Fibromyalgia    Chronic fatigue  . GERD (gastroesophageal reflux disease)   . Headache(784.0)   . History of headache   . History of low back pain   . Hyperlipidemia   . Hypertension   . Hypothyroidism   . Idiopathic pulmonary fibrosis ([place])   . Lyme disease   . Pulmonary fibrosis ([place])   . PVD (peripheral vascular disease) ([place])   . Strain, cervical   . Thrombosis   . Tobacco use     PAST SURGICAL HISTORY:   Past Surgical History:  Procedure Laterality Date  . ABDOMINAL HYSTERECTOMY    . BREAST BIOPSY Left 1990  . COLONOSCOPY WITH PROPOFOL N/A 11/21/2016   Procedure: COLONOSCOPY WITH PROPOFOL;  Surgeon: Lollie Sails,  MD;  Location: [facility] ENDOSCOPY;  Service: Endoscopy;  Laterality: N/A;  . ESOPHAGOGASTRODUODENOSCOPY (EGD) WITH PROPOFOL N/A 11/21/2016   Procedure: ESOPHAGOGASTRODUODENOSCOPY (EGD) WITH PROPOFOL;  Surgeon: Lollie Sails, MD;  Location: [facility] ENDOSCOPY;  Service: Endoscopy;  Laterality: N/A;  . LUMBAR SPINE SURGERY    . OVARY SURGERY    . TENDON REPAIR Right    Hand  . TONSILLECTOMY    . TUBAL LIGATION Bilateral   . ULNAR NERVE TRANSPOSITION Left   . VAGINA SURGERY     biopsy  . VULVECTOMY PARTIAL      SOCIAL HISTORY:   Social History  Substance Use Topics  . Smoking status: Former Smoker    Packs/day: 0.50    Years: 45.00    Types: Cigarettes  . Smokeless tobacco: Never Used  . Alcohol use Yes     Comment: Consumes alcohol on occasion    FAMILY HISTORY:   Family History  Problem Relation Age of Onset  . Uterine cancer Mother   . Leukemia Mother   . Congestive Heart Failure Mother   . COPD Mother   . Diabetes Father   . Congestive Heart Failure Father   . Heart disease Maternal Grandfather   . Heart disease Paternal Grandfather   . Arthritis Brother   . Neuropathy Brother   . Breast cancer Neg Hx     DRUG ALLERGIES:  No Known  Allergies  REVIEW OF SYSTEMS:   Review of Systems  Constitutional: Negative for chills, fever and malaise/fatigue.  HENT: Negative.  Negative for ear discharge, ear pain, hearing loss, nosebleeds and sore throat.   Eyes: Negative.  Negative for blurred vision and pain.  Respiratory: Positive for cough and shortness of breath. Negative for hemoptysis and wheezing.   Cardiovascular: Negative.  Negative for chest pain, palpitations and leg swelling.  Gastrointestinal: Negative.  Negative for abdominal pain, blood in stool, diarrhea, nausea and vomiting.  Genitourinary: Negative.  Negative for dysuria.  Musculoskeletal: Negative.  Negative for back pain.  Skin: Negative.   Neurological: Positive for weakness. Negative for dizziness,  tremors, speech change, focal weakness, seizures and headaches.  Endo/Heme/Allergies: Negative.  Does not bruise/bleed easily.  Psychiatric/Behavioral: Negative.  Negative for depression, hallucinations and suicidal ideas.    MEDICATIONS AT HOME:   Prior to Admission medications   Medication Sig Start Date End Date Taking? Authorizing Provider  acetaminophen (TYLENOL) 325 MG tablet Take 650 mg by mouth every 6 (six) hours as needed.   Yes Historical Provider, MD  albuterol (PROVENTIL HFA;VENTOLIN HFA) 108 (90 Base) MCG/ACT inhaler Inhale 1-2 puffs into the lungs every 6 (six) hours as needed for wheezing or shortness of breath.   Yes Historical Provider, MD  atorvastatin (LIPITOR) 40 MG tablet TAKE 1 TABLET (40 MG TOTAL) BY MOUTH DAILY. 10/03/16  Yes Wellington Hampshire, MD  levofloxacin (LEVAQUIN) 500 MG tablet Take 1 tablet (500 mg total) by mouth daily. 11/24/16 12/01/16 Yes Wilhelmina Mcardle, MD  levothyroxine (SYNTHROID, LEVOTHROID) 25 MCG tablet Take 1 tablet (25 mcg total) by mouth daily before breakfast. 07/08/16  Yes Leone Haven, MD  pantoprazole (PROTONIX) 40 MG tablet Take 40 mg by mouth 2 (two) times daily.   Yes Historical Provider, MD  predniSONE (DELTASONE) 20 MG tablet Take 2 tablets (40 mg total) by mouth daily. 11/24/16 12/01/16 Yes Wilhelmina Mcardle, MD  sertraline (ZOLOFT) 50 MG tablet TAKE ONE TABLET BY MOUTH DAILY 11/09/16  Yes Leone Haven, MD  ALPRAZolam Duanne Moron) 0.5 MG tablet Take 1 tablet (0.5 mg total) by mouth 2 (two) times daily as needed for anxiety. 11/18/16   Leone Haven, MD  Aspirin-Caffeine (BC FAST PAIN RELIEF ARTHRITIS) 1000-65 MG PACK Take 800 mg by mouth 4 (four) times daily.     Historical Provider, MD  ibuprofen (ADVIL,MOTRIN) 200 MG tablet Take 200 mg by mouth every 6 (six) hours as needed.    Historical Provider, MD  Nintedanib (OFEV) 100 MG CAPS Take 100 mg by mouth 2 (two) times daily. Patient not taking: Reported on 11/25/2016 08/23/16   Wilhelmina Mcardle, MD      VITAL SIGNS:  Blood pressure 110/64, pulse 92, resp. rate (!) 22, height 5\' 3"  (1.6 m), weight 81.6 kg (180 lb), SpO2 (!) 87 %.  PHYSICAL EXAMINATION:   Physical Exam  Constitutional: She is oriented to person, place, and time and well-developed, well-nourished, and in no distress. No distress.  HENT:  Head: Normocephalic.  Eyes: No scleral icterus.  Neck: Normal range of motion. Neck supple. No JVD present. No tracheal deviation present.  Cardiovascular: Normal rate, regular rhythm and normal heart sounds.  Exam reveals no gallop and no friction rub.   No murmur heard. Pulmonary/Chest: Effort normal and breath sounds normal. No respiratory distress. She has no wheezes. She has no rales. She exhibits no tenderness.  Coarse crackles on inspiration  Abdominal: Soft. Bowel sounds are normal.  She exhibits no distension and no mass. There is no tenderness. There is no rebound and no guarding.  Musculoskeletal: Normal range of motion. She exhibits no edema.  Neurological: She is alert and oriented to person, place, and time.  Skin: Skin is warm. No rash noted. No erythema.  Psychiatric: Affect and judgment normal.      LABORATORY PANEL:   CBC  Recent Labs Lab 11/25/16 1128  WBC 14.3*  HGB 11.9*  HCT 35.5  PLT 340   ------------------------------------------------------------------------------------------------------------------  Chemistries   Recent Labs Lab 11/25/16 1128  NA 136  K 3.6  CL 103  CO2 23  GLUCOSE 113*  BUN 14  CREATININE 0.86  CALCIUM 8.8*   ------------------------------------------------------------------------------------------------------------------  Cardiac Enzymes No results for input(s): TROPONINI in the last 168 hours. ------------------------------------------------------------------------------------------------------------------  RADIOLOGY:  Dg Chest 2 View  Result Date: 11/24/2016 CLINICAL DATA:  Known pulmonary  fibrosis. Increasing shortness of breath. Has begun home oxygen use. EXAM: CHEST  2 VIEW COMPARISON:  CT scan of the chest of August 16, 2016 and chest x-ray of Apr 27, 2016. FINDINGS: The lungs are slightly less well inflated today. The interstitial markings remain increased and are more conspicuous bilaterally. There is no alveolar infiltrate. There is no pleural effusion or pneumothorax. The heart is top-normal in size. The pulmonary vascularity is not engorged. There is calcification in the wall of the aortic arch. The trachea is midline. The bony thorax exhibits no acute abnormality. IMPRESSION: Increased interstitial markings bilaterally worrisome for progressive pulmonary fibrosis. Superimposed interstitial pneumonia could produce a similar pattern. No definite CHF. Thoracic aortic atherosclerosis. Electronically Signed   By: David  Martinique M.D.   On: 11/24/2016 14:02    EKG:   Normal sinus rhythm without ST elevation or depression  IMPRESSION AND PLAN:   65 year old female with idiopathic pulmonary fibrosis who presents with acute hypoxic respiratory failure due to worsening of underlying pulmonary fibrosis.  1. Acute hypoxic respiratory failure in the setting of progressive pulmonary fibrosis with superimposed interstitial pneumonia: Continue IV steroids and oxygen Continue Levaquin started yesterday Follow-up with further pulmonary recommendations Patient is being evaluated at [facility] for pulmonary transplant Check for influenza 2. Progressive pulmonary fibrosis: Plan is above Continue albuterol 3. Hypothyroidism: Continue Synthroid  4. Depression: Continue Zoloft  5. Hyperlipidemia: Continue atorvastatin  All the records are reviewed and case discussed with ED provider. Management plans discussed with the patient and she is  in agreement  CODE STATUS: liimited no MV  TOTAL TIME TAKING CARE OF THIS PATIENT: 45 minutes.    Jasir Rother M.D on 11/25/2016 at 3:26 PM  Between  7am to 6pm - Pager - [phone]  After 6pm go to [email or web address] - password [email or web address] [facility]  Office  [phone]  CC: Primary care physician; Tommi Rumps, MD

## 2016-11-25 NOTE — Telephone Encounter (Signed)
Pt called and stated that she is on 3 lpm of o2.  Pt states that when she is up walking around or doing dishes her o2 sats drop to 77% and 88% on 3 lpm.  Please advise. Rhonda J Cobb

## 2016-11-25 NOTE — ED Triage Notes (Signed)
Patient presents to ED via POV with c/o SOB. Hx of pulmonary fibrosis. Patient recently put on O2, 3L. Patient states SOB started 3 weeks ago. Patient with pressured speech in triage. A&O x4.

## 2016-11-25 NOTE — ED Provider Notes (Signed)
[facility] Emergency Department Provider Note ____________________________________________   I have reviewed the triage vital signs and the triage nursing note.  HISTORY  Chief Complaint Shortness of Breath   Historian Patient, spouse, and children at the bedside  HPI Kelli Morris is a 65 y.o. female who is followed by pulmonology Dr. Alva Garnet, for fairly recent diagnosis of pulmonary fibrosis, with shortness of breath over the past several months. She was seen by [facility] pulmonology for possible lung transplant in October or November. Over the past 1 week she's felt increasing shortness of breath and saw pulmonologist Dr. Jamal Collin yesterday and had a chest x-ray showing worsening fibrosis and possible pneumonia and was started on prednisone as well as Levaquin. She's had one dose of each. She was also started yesterday on home 3 L nasal cannula oxygen. Prior to yesterday she had not been on home oxygen.  Patient states that this morning even with minimal exertion such as trying to get up and put her clothes on she became very dyspneic and has a home O2 sat monitor that she's used for months, and today it showed O2 sat 77% or 82% on the 3 L nasal cannula oxygen.  No chest pain or chest pressure. No wheezing. She called the [facility] pulmonology line and they told her to come to the ED for further evaluation.  She's had some mild body aches without fever or chills. She's had a flu shot this year as well as a pneumonia shot.    Past Medical History:  Diagnosis Date  . Arrhythmia    afib  . Cancer ([place])    uterine  . Fibromyalgia    Chronic fatigue  . GERD (gastroesophageal reflux disease)   . Headache(784.0)   . History of headache   . History of low back pain   . Hyperlipidemia   . Hypertension   . Hypothyroidism   . Idiopathic pulmonary fibrosis ([place])   . Lyme disease   . Pulmonary fibrosis ([place])   . PVD (peripheral vascular disease) ([place])   . Strain,  cervical   . Thrombosis   . Tobacco use     Patient Active Problem List   Diagnosis Date Noted  . Obesity 09/30/2016  . Hyperparathyroidism ([place]) 09/30/2016  . Fibromyalgia 08/15/2016  . Osteoporosis 06/30/2016  . Elevated TSH 06/30/2016  . Esophageal reflux 06/30/2016  . Cellulitis 05/31/2016  . Elevated vitamin B12 level 05/31/2016  . Lightheadedness 05/31/2016  . Idiopathic pulmonary fibrosis ([place]) 05/18/2016  . Constipation 05/18/2016  . Vitamin B 12 deficiency 05/18/2016  . Onychomycosis 05/18/2016  . Chronic headaches 01/17/2016  . History of pneumonia 01/17/2016  . Anxiety 01/17/2016  . Hyperlipidemia 12/15/2015  . Right leg DVT ([place]) 09/11/2015  . Right leg swelling 07/31/2015  . PAD (peripheral artery disease) ([place]) 07/31/2015  . Dizziness 11/09/2013  . Chest heaviness 07/04/2013  . SOB (shortness of breath) 07/04/2013  . Claudication ([place]) 07/04/2013  . Bilateral leg edema 07/04/2013  . Tobacco use   . Encounter for therapeutic drug monitoring 01/25/2013  . Pain in limb 01/25/2013  . Lumbosacral spondylosis without myelopathy 01/25/2013  . Spinal stenosis, unspecified region other than cervical 01/25/2013  . Meningitis, unspecified(322.9) 01/25/2013  . Chronic fatigue syndrome 01/25/2013  . Myalgia and myositis, unspecified 01/25/2013    Past Surgical History:  Procedure Laterality Date  . ABDOMINAL HYSTERECTOMY    . BREAST BIOPSY Left 1990  . COLONOSCOPY WITH PROPOFOL N/A 11/21/2016   Procedure: COLONOSCOPY WITH PROPOFOL;  Surgeon: Lollie Sails, MD;  Location: [facility] ENDOSCOPY;  Service: Endoscopy;  Laterality: N/A;  . ESOPHAGOGASTRODUODENOSCOPY (EGD) WITH PROPOFOL N/A 11/21/2016   Procedure: ESOPHAGOGASTRODUODENOSCOPY (EGD) WITH PROPOFOL;  Surgeon: Lollie Sails, MD;  Location: [facility] ENDOSCOPY;  Service: Endoscopy;  Laterality: N/A;  . LUMBAR SPINE SURGERY    . OVARY SURGERY    . TENDON REPAIR Right    Hand  . TONSILLECTOMY    . TUBAL LIGATION  Bilateral   . ULNAR NERVE TRANSPOSITION Left   . VAGINA SURGERY     biopsy  . VULVECTOMY PARTIAL      Prior to Admission medications   Medication Sig Start Date End Date Taking? Authorizing Provider  acetaminophen (TYLENOL) 325 MG tablet Take 650 mg by mouth every 6 (six) hours as needed.   Yes Historical Provider, MD  albuterol (PROVENTIL HFA;VENTOLIN HFA) 108 (90 Base) MCG/ACT inhaler Inhale 1-2 puffs into the lungs every 6 (six) hours as needed for wheezing or shortness of breath.   Yes Historical Provider, MD  atorvastatin (LIPITOR) 40 MG tablet TAKE 1 TABLET (40 MG TOTAL) BY MOUTH DAILY. 10/03/16  Yes Wellington Hampshire, MD  levofloxacin (LEVAQUIN) 500 MG tablet Take 1 tablet (500 mg total) by mouth daily. 11/24/16 12/01/16 Yes Wilhelmina Mcardle, MD  levothyroxine (SYNTHROID, LEVOTHROID) 25 MCG tablet Take 1 tablet (25 mcg total) by mouth daily before breakfast. 07/08/16  Yes Leone Haven, MD  pantoprazole (PROTONIX) 40 MG tablet Take 40 mg by mouth 2 (two) times daily.   Yes Historical Provider, MD  predniSONE (DELTASONE) 20 MG tablet Take 2 tablets (40 mg total) by mouth daily. 11/24/16 12/01/16 Yes Wilhelmina Mcardle, MD  sertraline (ZOLOFT) 50 MG tablet TAKE ONE TABLET BY MOUTH DAILY 11/09/16  Yes Leone Haven, MD  ALPRAZolam Duanne Moron) 0.5 MG tablet Take 1 tablet (0.5 mg total) by mouth 2 (two) times daily as needed for anxiety. 11/18/16   Leone Haven, MD  Aspirin-Caffeine (BC FAST PAIN RELIEF ARTHRITIS) 1000-65 MG PACK Take 800 mg by mouth 4 (four) times daily.     Historical Provider, MD  ibuprofen (ADVIL,MOTRIN) 200 MG tablet Take 200 mg by mouth every 6 (six) hours as needed.    Historical Provider, MD  Nintedanib (OFEV) 100 MG CAPS Take 100 mg by mouth 2 (two) times daily. Patient not taking: Reported on 11/25/2016 08/23/16   Wilhelmina Mcardle, MD    No Known Allergies  Family History  Problem Relation Age of Onset  . Uterine cancer Mother   . Leukemia Mother   .  Congestive Heart Failure Mother   . COPD Mother   . Diabetes Father   . Congestive Heart Failure Father   . Heart disease Maternal Grandfather   . Heart disease Paternal Grandfather   . Arthritis Brother   . Neuropathy Brother   . Breast cancer Neg Hx     Social History Social History  Substance Use Topics  . Smoking status: Former Smoker    Packs/day: 0.50    Years: 45.00    Types: Cigarettes  . Smokeless tobacco: Never Used  . Alcohol use Yes     Comment: Consumes alcohol on occasion    Review of Systems  Constitutional: Negative for fever. Eyes: Negative for visual changes. ENT: Negative for sore throat. Cardiovascular: Negative for chest pain. Respiratory: Negative for shortness of breath. Gastrointestinal: Negative for abdominal pain, vomiting and diarrhea. Genitourinary: Negative for dysuria. Musculoskeletal: Negative for back pain. Skin: Negative for  rash. Neurological: Negative for headache. 10 point Review of Systems otherwise negative ____________________________________________   PHYSICAL EXAM:  VITAL SIGNS: ED Triage Vitals  Enc Vitals Group     BP 11/25/16 1126 124/65     Pulse Rate 11/25/16 1126 93     Resp 11/25/16 1230 14     Temp --      Temp src --      SpO2 11/25/16 1126 94 %     Weight 11/25/16 1124 180 lb (81.6 kg)     Height 11/25/16 1124 5\' 3"  (1.6 m)     Head Circumference --      Peak Flow --      Pain Score 11/25/16 1125 4     Pain Loc --      Pain Edu? --      Excl. in [place]? --      Constitutional: Alert and oriented. Well appearing and in no distress. HEENT   Head: Normocephalic and atraumatic.      Eyes: Conjunctivae are normal. PERRL. Normal extraocular movements.      Ears:         Nose: No congestion/rhinnorhea. Nasal canula oxygen in place.   Mouth/Throat: Mucous membranes are moist.   Neck: No stridor. Cardiovascular/Chest: Normal rate, regular rhythm.  No murmurs, rubs, or gallops. Respiratory: Normal  respiratory effort without tachypnea nor retractions. Breath sounds are clear and equal bilaterally. No wheezes/rales/rhonchi. Gastrointestinal: Soft. No distention, no guarding, no rebound. Nontender.    Genitourinary/rectal:Deferred Musculoskeletal: Nontender with normal range of motion in all extremities. No joint effusions.  No lower extremity tenderness.  No edema. Neurologic:  Normal speech and language. No gross or focal neurologic deficits are appreciated. Skin:  Skin is warm, dry and intact. No rash noted. Psychiatric: Mood and affect are normal. Speech and behavior are normal. Patient exhibits appropriate insight and judgment.   ____________________________________________  LABS (pertinent positives/negatives)  Labs Reviewed  CBC WITH DIFFERENTIAL/PLATELET - Abnormal; Notable for the following:       Result Value   WBC 14.3 (*)    Hemoglobin 11.9 (*)    Neutro Abs 12.5 (*)    All other components within normal limits  BASIC METABOLIC PANEL - Abnormal; Notable for the following:    Glucose, Bld 113 (*)    Calcium 8.8 (*)    All other components within normal limits  TROPONIN I  BRAIN NATRIURETIC PEPTIDE  INFLUENZA PANEL BY PCR (TYPE A & B, H1N1)    ____________________________________________    EKG I, Lisa Roca, MD, the attending physician have personally viewed and interpreted all ECGs.  84 bpm. Normal sinus rhythm.  Narrow QRS. Normal axis.. Normal ST and T-wave ____________________________________________  RADIOLOGY All Xrays were viewed by me. Imaging interpreted by Radiologist.  Reviewed yesterday's chest x-ray report:  IMPRESSION: Increased interstitial markings bilaterally worrisome for progressive pulmonary fibrosis. Superimposed interstitial pneumonia could produce a similar pattern. No definite CHF.  Thoracic aortic atherosclerosis. __________________________________________  PROCEDURES  Procedure(s) performed: None  Critical Care  performed: None  ____________________________________________   ED COURSE / ASSESSMENT AND PLAN  Pertinent labs & imaging results that were available during my care of the patient were reviewed by me and considered in my medical decision making (see chart for details).    Ms. Delerme is here with worsening dyspnea and also hypoxia down into the mid 70s with any exertion on 3 L home O2 which was actually just started yesterday after worsening times one week and chest x-ray  showing worsening, I fibrosis with possible pneumonia.  Symptoms seem less consistent with pneumonia, certainly no fever, tachycardia or hypotension, and I'm less suspicious that the problem is worsening pneumonia. I spoke with her pulmonologist who felt like the patient's symptoms were from pulmonary fibrosis exacerbation. He recommended IV steroids and admission.  The patient requested that I speak with pulmonology given that she was evaluated there for pulmonary transplant. I spoke with Dr. Karena Addison , did pulmonology who was happy to accept the patient in transfer, however did not feel that any additional treatments would be likely, and felt like the patient would be treated well here. I spoke with the patient and her husband and they did request to go ahead and stay here at [place]:   I spoke by phone with Dr. Alva Garnet, patients pulmonologist, recommends IV steroids and admission.  Phone consultation with [facility] pulmonology.  Hospitalist [facility] for admission.   Patient / Family / Caregiver informed of clinical course, medical decision-making process, and agree with plan.  ___________________________________________   FINAL CLINICAL IMPRESSION(S) / ED DIAGNOSES   Final diagnoses:  Hypoxia  Pulmonary fibrosis ([place])              Note: This dictation was prepared with Dragon dictation. Any transcriptional errors that result from this process are unintentional    Lisa Roca,  MD 11/25/16 1501

## 2016-11-26 ENCOUNTER — Inpatient Hospital Stay: Payer: Medicare Other

## 2016-11-26 LAB — RESPIRATORY PANEL BY PCR
Adenovirus: NOT DETECTED
BORDETELLA PERTUSSIS-RVPCR: NOT DETECTED
CHLAMYDOPHILA PNEUMONIAE-RVPPCR: NOT DETECTED
CORONAVIRUS 229E-RVPPCR: NOT DETECTED
CORONAVIRUS HKU1-RVPPCR: NOT DETECTED
Coronavirus NL63: NOT DETECTED
Coronavirus OC43: NOT DETECTED
INFLUENZA B-RVPPCR: NOT DETECTED
Influenza A: NOT DETECTED
Metapneumovirus: NOT DETECTED
Mycoplasma pneumoniae: NOT DETECTED
Parainfluenza Virus 1: NOT DETECTED
Parainfluenza Virus 2: NOT DETECTED
Parainfluenza Virus 3: NOT DETECTED
Parainfluenza Virus 4: NOT DETECTED
RESPIRATORY SYNCYTIAL VIRUS-RVPPCR: NOT DETECTED
RHINOVIRUS / ENTEROVIRUS - RVPPCR: NOT DETECTED

## 2016-11-26 LAB — BASIC METABOLIC PANEL
ANION GAP: 9 (ref 5–15)
BUN: 16 mg/dL (ref 6–20)
CHLORIDE: 104 mmol/L (ref 101–111)
CO2: 23 mmol/L (ref 22–32)
Calcium: 7.9 mg/dL — ABNORMAL LOW (ref 8.9–10.3)
Creatinine, Ser: 0.97 mg/dL (ref 0.44–1.00)
GFR calc Af Amer: >60 mL/min (ref >60)
GFR, EST NON AFRICAN AMERICAN: 60 mL/min — AB (ref >60)
Glucose, Bld: 164 mg/dL — ABNORMAL HIGH (ref 65–99)
POTASSIUM: 3.7 mmol/L (ref 3.5–5.1)
SODIUM: 136 mmol/L (ref 135–145)

## 2016-11-26 LAB — CBC
HEMATOCRIT: 32.5 % — AB (ref 35.0–47.0)
HEMOGLOBIN: 10.9 g/dL — AB (ref 12.0–16.0)
MCH: 31.9 pg (ref 26.0–34.0)
MCHC: 33.7 g/dL (ref 32.0–36.0)
MCV: 94.7 fL (ref 80.0–100.0)
Platelets: 351 10*3/uL (ref 150–440)
RBC: 3.43 MIL/uL — ABNORMAL LOW (ref 3.80–5.20)
RDW: 13.5 % (ref 11.5–14.5)
WBC: 8.8 10*3/uL (ref 3.6–11.0)

## 2016-11-26 MED ORDER — ACETAMINOPHEN 500 MG PO TABS
1000.0000 mg | ORAL_TABLET | Freq: Four times a day (QID) | ORAL | Status: DC | PRN
  Administered 2016-11-26 (×2): 1000 mg via ORAL
  Filled 2016-11-26 (×2): qty 2

## 2016-11-26 MED ORDER — HYDROCODONE-ACETAMINOPHEN 5-325 MG PO TABS
0.5000 | ORAL_TABLET | Freq: Once | ORAL | Status: AC
  Administered 2016-11-26: 0.5 via ORAL
  Filled 2016-11-26: qty 1

## 2016-11-26 MED ORDER — KETOROLAC TROMETHAMINE 15 MG/ML IJ SOLN
15.0000 mg | Freq: Once | INTRAMUSCULAR | Status: AC
  Administered 2016-11-26: 15 mg via INTRAVENOUS
  Filled 2016-11-26: qty 1

## 2016-11-26 MED ORDER — ACETAMINOPHEN 650 MG RE SUPP: 650.0000 mg | Freq: Four times a day (QID) | RECTAL | Status: DC | PRN

## 2016-11-26 MED ORDER — ALBUTEROL SULFATE (2.5 MG/3ML) 0.083% IN NEBU
3.0000 mL | INHALATION_SOLUTION | Freq: Four times a day (QID) | RESPIRATORY_TRACT | Status: DC
  Administered 2016-11-26 – 2016-11-27 (×2): 3 mL via RESPIRATORY_TRACT
  Filled 2016-11-26 (×4): qty 3

## 2016-11-26 MED ORDER — BUDESONIDE 0.5 MG/2ML IN SUSP
0.5000 mg | Freq: Two times a day (BID) | RESPIRATORY_TRACT | Status: DC
  Administered 2016-11-26: 0.5 mg via RESPIRATORY_TRACT
  Filled 2016-11-26 (×2): qty 2

## 2016-11-26 MED ORDER — POLYETHYLENE GLYCOL 3350 17 G PO PACK
17.0000 g | PACK | Freq: Every day | ORAL | Status: DC
  Administered 2016-11-26 – 2016-11-27 (×2): 17 g via ORAL
  Filled 2016-11-26 (×2): qty 1

## 2016-11-26 MED ORDER — OXYCODONE-ACETAMINOPHEN 5-325 MG PO TABS
0.5000 | ORAL_TABLET | ORAL | Status: DC | PRN
  Administered 2016-11-26 – 2016-11-27 (×2): 0.5 via ORAL
  Filled 2016-11-26 (×2): qty 1

## 2016-11-26 MED ORDER — MAGNESIUM SULFATE 2 GM/50ML IV SOLN
2.0000 g | Freq: Once | INTRAVENOUS | Status: AC
  Administered 2016-11-26: 2 g via INTRAVENOUS
  Filled 2016-11-26: qty 50

## 2016-11-26 NOTE — Progress Notes (Signed)
Patient ID: Kelli Morris, female   DOB: [birth year], 65 y.o.   MRN: [id number]  [facility] PROGRESS NOTE  Kelli Morris [id number] DOB: 12/30/1950 DOA: 11/25/2016 PCP: Tommi Rumps, MD  HPI/Subjective: Patient recently started on oxygen. Was on 3 L at home. Today was on 5 L this morning. She's been having severe headache. She recently had an endoscopy and vomiting episodes. Today's chest x-ray showed pneumonia in the right upper lobe.  Objective: Vitals:   11/26/16 0800 11/26/16 1300  BP: 130/68 114/63  Pulse: 88 89  Resp: 20 19  Temp: 97.9 F (36.6 C) 98 F (36.7 C)    Filed Weights   11/25/16 1124 11/25/16 1717  Weight: 81.6 kg (180 lb) 84.2 kg (185 lb 11.2 oz)    ROS: Review of Systems  Constitutional: Negative for chills and fever.  Eyes: Negative for blurred vision.  Respiratory: Positive for cough and shortness of breath.   Cardiovascular: Negative for chest pain.  Gastrointestinal: Negative for abdominal pain, constipation, diarrhea, nausea and vomiting.  Genitourinary: Negative for dysuria.  Musculoskeletal: Negative for joint pain.  Neurological: Positive for headaches. Negative for dizziness.   Exam: Physical Exam  Constitutional: She is oriented to person, place, and time.  HENT:  Nose: No mucosal edema.  Mouth/Throat: No oropharyngeal exudate or posterior oropharyngeal edema.  Eyes: Conjunctivae, EOM and lids are normal. Pupils are equal, round, and reactive to light.  Neck: No JVD present. Carotid bruit is not present. No edema present. No thyroid mass and no thyromegaly present.  Cardiovascular: S1 normal and S2 normal.  Exam reveals no gallop.   No murmur heard. Pulses:      Dorsalis pedis pulses are 2+ on the right side, and 2+ on the left side.  Respiratory: No respiratory distress. She has decreased breath sounds in the right middle field, the right lower field, the left middle field and the left lower field. She has no wheezes. She has  rhonchi in the right lower field and the left lower field. She has no rales.  GI: Soft. Bowel sounds are normal. There is no tenderness.  Musculoskeletal:       Right ankle: She exhibits swelling.       Left ankle: She exhibits swelling.  Lymphadenopathy:    She has no cervical adenopathy.  Neurological: She is alert and oriented to person, place, and time. No cranial nerve deficit.  Skin: Skin is warm. No rash noted. Nails show no clubbing.  Psychiatric: She has a normal mood and affect.      Data Reviewed: Basic Metabolic Panel:  Recent Labs Lab 11/25/16 1128 11/26/16 0421  NA 136 136  K 3.6 3.7  CL 103 104  CO2 23 23  GLUCOSE 113* 164*  BUN 14 16  CREATININE 0.86 0.97  CALCIUM 8.8* 7.9*   CBC:  Recent Labs Lab 11/25/16 1128 11/26/16 0421  WBC 14.3* 8.8  NEUTROABS 12.5*  --   HGB 11.9* 10.9*  HCT 35.5 32.5*  MCV 93.5 94.7  PLT 340 351   BNP (last 3 results)  Recent Labs  11/25/16 1128  BNP 81.0      Recent Results (from the past 240 hour(s))  Respiratory Panel by PCR     Status: None   Collection Time: 11/26/16  3:17 AM  Result Value Ref Range Status   Adenovirus NOT DETECTED NOT DETECTED Final   Coronavirus 229E NOT DETECTED NOT DETECTED Final   Coronavirus HKU1 NOT DETECTED NOT DETECTED Final  Coronavirus NL63 NOT DETECTED NOT DETECTED Final   Coronavirus OC43 NOT DETECTED NOT DETECTED Final   Metapneumovirus NOT DETECTED NOT DETECTED Final   Rhinovirus / Enterovirus NOT DETECTED NOT DETECTED Final   Influenza A NOT DETECTED NOT DETECTED Final   Influenza B NOT DETECTED NOT DETECTED Final   Parainfluenza Virus 1 NOT DETECTED NOT DETECTED Final   Parainfluenza Virus 2 NOT DETECTED NOT DETECTED Final   Parainfluenza Virus 3 NOT DETECTED NOT DETECTED Final   Parainfluenza Virus 4 NOT DETECTED NOT DETECTED Final   Respiratory Syncytial Virus NOT DETECTED NOT DETECTED Final   Bordetella pertussis NOT DETECTED NOT DETECTED Final   Chlamydophila  pneumoniae NOT DETECTED NOT DETECTED Final   Mycoplasma pneumoniae NOT DETECTED NOT DETECTED Final     Studies: Dg Chest 2 View  Result Date: 11/26/2016 CLINICAL DATA:  Worsening shortness of breath, history of pulmonary fibrosis EXAM: CHEST  2 VIEW COMPARISON:  11/24/2016 FINDINGS: Chronic interstitial markings with subpleural reticulation/fibrosis, compatible with the clinical history. Superimposed patchy right upper lobe opacity, suspicious for pneumonia, increased. The heart is normal in size. Mild degenerative changes of the visualized thoracolumbar spine. IMPRESSION: Patchy right upper lobe opacity, suspicious for pneumonia, increased. Underlying pulmonary fibrosis, chronic. Electronically Signed   By: Julian Hy M.D.   On: 11/26/2016 09:58    Scheduled Meds: . albuterol  3 mL Inhalation Q6H  . atorvastatin  40 mg Oral q1800  . budesonide (PULMICORT) nebulizer solution  0.5 mg Nebulization BID  . enoxaparin (LOVENOX) injection  40 mg Subcutaneous Q24H  . levofloxacin (LEVAQUIN) IV  500 mg Intravenous Q24H  . levothyroxine  25 mcg Oral QAC breakfast  . methylPREDNISolone (SOLU-MEDROL) injection  80 mg Intravenous Q12H  . pantoprazole  40 mg Oral BID  . polyethylene glycol  17 g Oral Daily  . sertraline  50 mg Oral Daily  . sodium chloride flush  3 mL Intravenous Q12H    Assessment/Plan:  1. Acute on chronic hypoxic respiratory failure. I dialed down the oxygen from 5 L to 4 L. Hopefully can get back to her baseline 3 L. 2. Idiopathic pulmonary fibrosis. IV Solu-Medrol DuoNeb nebulizer and budesonide nebulizers. 3. Right upper lobe pneumonia on IV Levaquin 4. Hyperlipidemia unspecified on atorvastatin 5. Hypothyroidism unspecified on levothyroxine 6. GERD on Protonix 7. Constipation on MiraLAX 8. Gastric stenosis and unable to complete colonoscopy secondary to stricture in the colon. 9. Depression on Zoloft  Code Status:     Code Status Orders        Start      Ordered   11/25/16 1713  Limited resuscitation (code)  Continuous    Question Answer Comment  In the event of cardiac or respiratory ARREST: Initiate Code Blue, Call Rapid Response Yes   In the event of cardiac or respiratory ARREST: Perform CPR Yes   In the event of cardiac or respiratory ARREST: Perform Intubation/Mechanical Ventilation No   In the event of cardiac or respiratory ARREST: Use NIPPV/BiPAp only if indicated Yes   In the event of cardiac or respiratory ARREST: Administer ACLS medications if indicated Yes   In the event of cardiac or respiratory ARREST: Perform Defibrillation or Cardioversion if indicated Yes      11/25/16 1712    Code Status History    Date Active Date Inactive Code Status Order ID Comments User Context   This patient has a current code status but no historical code status.    Advance Directive Documentation   Flowsheet  Row Most Recent Value  Type of Advance Directive  Living will, Healthcare Power of Attorney  Pre-existing out of facility DNR order (yellow form or pink MOST form)  No data  "MOST" Form in Place?  No data     Family Communication: Husband at the bedside Disposition Plan: Potentially home in a day or so  Consultants:  Pulmonary  Antibiotics:  Levaquin  Time spent: 28 minutes  [place], [place]

## 2016-11-26 NOTE — Progress Notes (Signed)
PULMONARY FOLLOW UP NOTE  Requesting MD/Service: Mody, [facility] Date of initial consultation: 11/25/16 Reason for consultation: Acute on chronic hypoxic respiratory failure, IPF  PT PROFILE: 18 F with presumed diagnosis of IPF (not biopsy proven) who has had a relatively stable course since diagnosis in April 2017 until past week or so. She was seen in office 11/24/16 with worsening dyspnea, hypoxemia and interstitial infiltrates on CXR. She was prescribed a course of prednisone 40 mg daily X 7 days and levofloxacin X 7 days. Oxygen therapy was initiated as outpt 12/21  01/26/16 HRCT: Extensive peripheral basilar predominant reticulation, ground-glass attenuation and traction bronchiectasis in both lungs, with early honeycombing in the lower lobes. 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 Indeterminate irregular 1.2 cm nodular focus of consolidation in the basilar right lower lobe. This may represent part of the interstitial lung disease, however a true pulmonary nodule cannot be excluded.  03/08/16: Rheumatoid factor and ANA negative 04/27/16 PFTs: No obstruction, mild-mod restriction, mod decrease in DLCO 04/27/16 CXR: diffuse interstitial lung disease 08/16/16 CT chest: Right lower lobe pulmonary nodule measures slightly smaller although this difference may be technical only. Recommend an additional follow-up chest CT in 6 months to document longer term stability. Stable changes of presumed usual interstitial pneumonia No acute findings. 08/19/16 PFTs: No obstruction, mild-mod restriction, mod decrease in DLCO - [place] from prior study 09/26/16 Lung transplant eval @ [place]. Recommend weight loss of 10-25#. Pulm rehab initiated  10/21/16 Telephone encounter: reported severe diarrhea. Recommended holding OfevX 3 days, then once a day for 7 days, then full dose of twice a day if tolerating. 11/21/16 EGD, colonoscopy: Patchy mild inflammation characterized by  erythema was found in the duodenal bulb. There is a posterior bulbar/proximal second portion stenosis with mild inflammation at the site. 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 Unable to pass the scope beyond into the second and third portion of the duodenum. There is the appearance of possible previous duodenal ulcer disease Incomplete colon, reached rectosigmoid colon. Multiple polyps seen. 6 polyps retrieved, one not retrieved. 11/24/16 Acute office visit: 6-7 days of increased dyspnea, NP cough, subjective fevers and hypoxemia with SpO2 as low as 70s. Intolerant to Ofev due to GI issues  SUBJ:  No significant change - no better, no worse  OBJ:  Vitals:   11/25/16 1717 11/25/16 1936 11/26/16 0442 11/26/16 0800  BP:  126/65 134/67 130/68  Pulse:  83 81 88  Resp:  19 18 20   Temp:  97.4 F (36.3 C) 98 F (36.7 C) 97.9 F (36.6 C)  TempSrc:  Oral Oral Oral  SpO2:  98% 93% 97%  Weight: 185 lb 11.2 oz (84.2 kg)     Height:       3 lpm [place]  EXAM:  Gen: breathless after ambulating to rest room HEENT: NCAT Neck: No JVD Lungs: diffuse, fine crackles, no wheezes Cardiovascular: RRR s M Abdomen: Soft, nontender, normal BS Ext: No edema  DATA:   BMP Latest Ref Rng & Units 11/26/2016 11/25/2016 06/30/2016  Glucose 65 - 99 mg/dL 164(H) 113(H) 83  BUN 6 - 20 mg/dL 16 14 10   Creatinine 0.44 - 1.00 mg/dL 0.97 0.86 1.10  BUN/Creat Ratio 11 - 26 - - -  Sodium 135 - 145 mmol/L 136 136 140  Potassium 3.5 - 5.1 mmol/L 3.7 3.6 4.3  Chloride 101 - 111 mmol/L 104 103 106  CO2 22 - 32 mmol/L 23  23 28  Calcium 8.9 - 10.3 mg/dL 7.9(L) 8.8(L) 9.5    CBC Latest Ref Rng & Units 11/26/2016 11/25/2016 06/02/2016  WBC 3.6 - 11.0 K/uL 8.8 14.3(H) 6.9  Hemoglobin 12.0 - 16.0 g/dL 10.9(L) 11.9(L) 12.5  Hematocrit 35.0 - 47.0 % 32.5(L) 35.5 37.3  Platelets 150 - 440 K/uL 351 340 253.0    CXR:  Diffuse interstitial prominence with confluence of opacity in  RUL  IMPRESSION:   1) IPF - previously mild restriction on PFTs 2) Acute hypoxic respiratory failure with increased interstitial markings on CXR 12/21  Likely acute exacerbation of IPF   PLAN:  Cont supplemental O2 to maintain SpO2 > 90% Cont empiric levofloxacin  Cont systemic steroids @ methylprednisolone 80 mg q 12 hrs Follow CXR - recheck Monday 12/25 AM She already has follow up scheduled with me in office 12/06/16  Merton Border, MD PCCM service Mobile [phone] Pager [phone] 11/26/2016

## 2016-11-27 MED ORDER — LEVOFLOXACIN 500 MG PO TABS: ORAL_TABLET | ORAL | 0 refills | Status: DC

## 2016-11-27 MED ORDER — KETOROLAC TROMETHAMINE 15 MG/ML IJ SOLN
15.0000 mg | Freq: Once | INTRAMUSCULAR | Status: AC
  Administered 2016-11-27: 15 mg via INTRAVENOUS
  Filled 2016-11-27: qty 1

## 2016-11-27 MED ORDER — PREDNISONE 20 MG PO TABS: ORAL_TABLET | ORAL | 0 refills | Status: DC

## 2016-11-27 NOTE — Progress Notes (Signed)
Per Dr. Marshia Ly request, Mrs. Loiselle was ambulated in the hallway with oxygen.  Oxygen was increased to 5L and her sats fluctuated between 84-86%.  Heart rate increased to lower 100's and peaked to 120.  Upon return to room patient stated that she felt a little out of breath but otherwise felt good.  Oxygen saturations increased steadily and oxygen delivery was decreased to 3L.  This information was relayed verbally to Dr. Leslye Peer while he was present on the unit.

## 2016-11-27 NOTE — Discharge Summary (Signed)
[facility] NAME: Kelli Morris    MR#:  [id number]  DATE OF BIRTH:  05/21/1951  DATE OF ADMISSION:  11/25/2016 ADMITTING PHYSICIAN: Bettey Costa, MD  DATE OF DISCHARGE: 11/27/2016 10:18 AM  PRIMARY CARE PHYSICIAN: Tommi Rumps, MD    ADMISSION DIAGNOSIS:  Pulmonary fibrosis (HCC) [J84.10] IPF (idiopathic pulmonary fibrosis) ([place]) [J84.112] Hypoxia [R09.02] Osteoporosis [M81.0]  DISCHARGE DIAGNOSIS:  Active Problems:   IPF (idiopathic pulmonary fibrosis) ([place])   SECONDARY DIAGNOSIS:   Past Medical History:  Diagnosis Date  . Arrhythmia    afib  . Cancer ([place])    uterine  . Fibromyalgia    Chronic fatigue  . GERD (gastroesophageal reflux disease)   . Headache(784.0)   . History of headache   . History of low back pain   . Hyperlipidemia   . Hypertension   . Hypothyroidism   . Idiopathic pulmonary fibrosis ([place])   . Lyme disease   . Pulmonary fibrosis ([place])   . PVD (peripheral vascular disease) ([place])   . Strain, cervical   . Thrombosis   . Tobacco use     HOSPITAL COURSE:   1. Acute on chronic hypoxic respiratory failure. The patient was recently started on oxygen 3 L nasal cannula. She was up to 5 L nasal cannula yesterday when I saw her. We did get her down to 3 L nasal cannula while at rest. But when she walked around we had a go up to 5 L nasal cannula. 2. Idiopathic pulmonary fibrosis exacerbation. She was started on high-dose Solu-Medrol. Seen in consultation by Dr. Alva Garnet pulmonary. Patient will be discharged on 60 mg of prednisone for 5 days then 40 mg daily until seen by Dr. Alva Garnet as outpatient. He will continue tapering steroids as outpatient 3. Right upper lobe pneumonia patient was on IV Levaquin and will switch over to by mouth Levaquin upon going home. 4. Hyperlipidemia unspecified on atorvastatin 5. Hypothyroidism unspecified on levothyroxine 6. GERD on Protonix 7. Constipation on MiraLAX 8.  Gastric stenosis and unable to complete colonoscopy secondary to stricture in the colon. Patient was supposed to have a virtual colonoscopy with prep but I would rather wait until she is breathing better. 9. Depression on Zoloft  DISCHARGE CONDITIONS:   Satisfactory  CONSULTS OBTAINED:  Treatment Team:  Wilhelmina Mcardle, MD  DRUG ALLERGIES:  No Known Allergies  DISCHARGE MEDICATIONS:   Discharge Medication List as of 11/27/2016  8:47 AM    CONTINUE these medications which have CHANGED   Details  levofloxacin (LEVAQUIN) 500 MG tablet One tab daily for five days, Print    predniSONE (DELTASONE) 20 MG tablet 3 tabs po daily for five days, then 2 tabs po daily until seen by Dr Alva Garnet, Print      CONTINUE these medications which have NOT CHANGED   Details  acetaminophen (TYLENOL) 325 MG tablet Take 650 mg by mouth every 6 (six) hours as needed., Historical Med    albuterol (PROVENTIL HFA;VENTOLIN HFA) 108 (90 Base) MCG/ACT inhaler Inhale 1-2 puffs into the lungs every 6 (six) hours as needed for wheezing or shortness of breath., Historical Med    atorvastatin (LIPITOR) 40 MG tablet TAKE 1 TABLET (40 MG TOTAL) BY MOUTH DAILY., Normal    levothyroxine (SYNTHROID, LEVOTHROID) 25 MCG tablet Take 1 tablet (25 mcg total) by mouth daily before breakfast., Starting Fri 07/08/2016, Normal    pantoprazole (PROTONIX) 40 MG tablet Take 40 mg by mouth 2 (two)  times daily., Historical Med    sertraline (ZOLOFT) 50 MG tablet TAKE ONE TABLET BY MOUTH DAILY, Normal    ALPRAZolam (XANAX) 0.5 MG tablet Take 1 tablet (0.5 mg total) by mouth 2 (two) times daily as needed for anxiety., Starting Fri 11/18/2016, Phone In    Aspirin-Caffeine [facility] FAST PAIN RELIEF ARTHRITIS) 1000-65 MG PACK Take 800 mg by mouth 4 (four) times daily. , Historical Med    ibuprofen (ADVIL,MOTRIN) 200 MG tablet Take 200 mg by mouth every 6 (six) hours as needed., Historical Med      STOP taking these medications      Nintedanib (OFEV) 100 MG CAPS          DISCHARGE INSTRUCTIONS:   Follow-up with Dr. Alva Garnet as scheduled Follow-up with PMD 2 weeks  If you experience worsening of your admission symptoms, develop shortness of breath, life threatening emergency, suicidal or homicidal thoughts you must seek medical attention immediately by calling 911 or calling your MD immediately  if symptoms less severe.  You Must read complete instructions/literature along with all the possible adverse reactions/side effects for all the Medicines you take and that have been prescribed to you. Take any new Medicines after you have completely understood and accept all the possible adverse reactions/side effects.   Please note  You were cared for by a hospitalist during your hospital stay. If you have any questions about your discharge medications or the care you received while you were in the hospital after you are discharged, you can call the unit and asked to speak with the hospitalist on call if the hospitalist that took care of you is not available. Once you are discharged, your primary care physician will handle any further medical issues. Please note that NO REFILLS for any discharge medications will be authorized once you are discharged, as it is imperative that you return to your primary care physician (or establish a relationship with a primary care physician if you do not have one) for your aftercare needs so that they can reassess your need for medications and monitor your lab values.    Today   CHIEF COMPLAINT:   Chief Complaint  Patient presents with  . Shortness of Breath    HISTORY OF PRESENT ILLNESS:  Kelli Morris  is a 65 y.o. female with a known history of Idiopathic pulmonary fibrosis presents with worsening shortness of breath   VITAL SIGNS:  Blood pressure 120/66, pulse 91, temperature 97.7 F (36.5 C), temperature source Oral, resp. rate 17, height 5\' 3"  (1.6 m), weight 84.2 kg (185 lb 11.2  oz), SpO2 95 %.   PHYSICAL EXAMINATION:  GENERAL:  65 y.o.-year-old patient lying in the bed with no acute distress.  EYES: Pupils equal, round, reactive to light and accommodation. No scleral icterus. Extraocular muscles intact.  HEENT: Head atraumatic, normocephalic. Oropharynx and nasopharynx clear.  NECK:  Supple, no jugular venous distention. No thyroid enlargement, no tenderness.  LUNGS: Decreased breath sounds bilaterally but better air entry today than yesterday, no wheezing, rales,rhonchi or crepitation. No use of accessory muscles of respiration.  CARDIOVASCULAR: S1, S2 normal. No murmurs, rubs, or gallops.  ABDOMEN: Soft, non-tender, non-distended. Bowel sounds present. No organomegaly or mass.  EXTREMITIES: No pedal edema, cyanosis, or clubbing.  NEUROLOGIC: Cranial nerves II through XII are intact. Muscle strength 5/5 in all extremities. Sensation intact. Gait not checked.  PSYCHIATRIC: The patient is alert and oriented x 3.  SKIN: No obvious rash, lesion, or ulcer.   DATA REVIEW:  CBC  Recent Labs Lab 11/26/16 0421  WBC 8.8  HGB 10.9*  HCT 32.5*  PLT 351    Chemistries   Recent Labs Lab 11/26/16 0421  NA 136  K 3.7  CL 104  CO2 23  GLUCOSE 164*  BUN 16  CREATININE 0.97  CALCIUM 7.9*     Microbiology Results  Results for orders placed or performed during the hospital encounter of 11/25/16  Respiratory Panel by PCR     Status: None   Collection Time: 11/26/16  3:17 AM  Result Value Ref Range Status   Adenovirus NOT DETECTED NOT DETECTED Final   Coronavirus 229E NOT DETECTED NOT DETECTED Final   Coronavirus HKU1 NOT DETECTED NOT DETECTED Final   Coronavirus NL63 NOT DETECTED NOT DETECTED Final   Coronavirus OC43 NOT DETECTED NOT DETECTED Final   Metapneumovirus NOT DETECTED NOT DETECTED Final   Rhinovirus / Enterovirus NOT DETECTED NOT DETECTED Final   Influenza A NOT DETECTED NOT DETECTED Final   Influenza B NOT DETECTED NOT DETECTED Final    Parainfluenza Virus 1 NOT DETECTED NOT DETECTED Final   Parainfluenza Virus 2 NOT DETECTED NOT DETECTED Final   Parainfluenza Virus 3 NOT DETECTED NOT DETECTED Final   Parainfluenza Virus 4 NOT DETECTED NOT DETECTED Final   Respiratory Syncytial Virus NOT DETECTED NOT DETECTED Final   Bordetella pertussis NOT DETECTED NOT DETECTED Final   Chlamydophila pneumoniae NOT DETECTED NOT DETECTED Final   Mycoplasma pneumoniae NOT DETECTED NOT DETECTED Final    RADIOLOGY:  Dg Chest 2 View  Result Date: 11/26/2016 CLINICAL DATA:  Worsening shortness of breath, history of pulmonary fibrosis EXAM: CHEST  2 VIEW COMPARISON:  11/24/2016 FINDINGS: Chronic interstitial markings with subpleural reticulation/fibrosis, compatible with the clinical history. Superimposed patchy right upper lobe opacity, suspicious for pneumonia, increased. The heart is normal in size. Mild degenerative changes of the visualized thoracolumbar spine. IMPRESSION: Patchy right upper lobe opacity, suspicious for pneumonia, increased. Underlying pulmonary fibrosis, chronic. Electronically Signed   By: Julian Hy M.D.   On: 11/26/2016 09:58     Management plans discussed with the patient, family and they are in agreement.  CODE STATUS:  Code Status History    Date Active Date Inactive Code Status Order ID Comments User Context   11/25/2016  5:12 PM 11/27/2016  1:23 PM Partial Code [id number]  Bettey Costa, MD Inpatient    Questions for Most Recent Historical Code Status (Order [id number])    Question Answer Comment   In the event of cardiac or respiratory ARREST: Initiate Code Blue, Call Rapid Response Yes    In the event of cardiac or respiratory ARREST: Perform CPR Yes    In the event of cardiac or respiratory ARREST: Perform Intubation/Mechanical Ventilation No    In the event of cardiac or respiratory ARREST: Use NIPPV/BiPAp only if indicated Yes    In the event of cardiac or respiratory ARREST: Administer ACLS medications  if indicated Yes    In the event of cardiac or respiratory ARREST: Perform Defibrillation or Cardioversion if indicated Yes         Advance Directive Documentation   Flowsheet Row Most Recent Value  Type of Advance Directive  Living will, Healthcare Power of Attorney  Pre-existing out of facility DNR order (yellow form or pink MOST form)  No data  "MOST" Form in Place?  No data      TOTAL TIME TAKING CARE OF THIS PATIENT: 35 minutes.    Loletha Grayer M.D on 11/27/2016  at 2:56 PM  Between 7am to 6pm - Pager - [phone]  After 6pm go to [email or web address] - password [email or web address] [facility]  [phone]  CC: Primary care physician; Tommi Rumps, MD

## 2016-11-27 NOTE — Discharge Instructions (Signed)
Wear 3 liters oxygen 24/7 (all the time) Can go up to 4 or 5 liters with ambulation but turn oxygen down to 3 liters at rest

## 2016-11-29 ENCOUNTER — Telehealth: Payer: Self-pay | Admitting: Family Medicine

## 2016-11-29 ENCOUNTER — Ambulatory Visit: Payer: Medicare Other

## 2016-11-29 NOTE — Telephone Encounter (Signed)
FYI - Pt called to make a 2 week HFU with Dr. Caryl Bis. Pt was admitted for pulmonary fibrosis. Pt is scheduled for 12/12/16 @ 11:30.

## 2016-11-29 NOTE — Telephone Encounter (Signed)
Left  Message to call office. 

## 2016-11-29 NOTE — Telephone Encounter (Signed)
Transition Care Management Follow-up Telephone Call  How have you been since you were released from the hospital? Patient stated she is feeling better, finds it easier breathing patient has some wheezing last night 11/28/16.   Do you understand why you were in the hospital? Yes absolutely My Oxygen content was dangerously low.   Do you understand the discharge instrutions? Yes absolutely Items Reviewed:  Medications reviewed: Yes, no changes from hospital. Patient is now on continuous 02 @ 2L minute since 11/24/16 by Dr. Jamal Collin.   Allergies reviewed:  Yes   Dietary changes reviewed: No  Referrals reviewed: Yes   Functional Questionnaire:   Activities of Daily Living (ADLs):   Patient stated she has needed assistance with bathing due to SOB. Husband is helping now due to out of work next 2 weeks. Patient husband has taken family leave.   Any transportation issues/concerns?:  NO   Any patient concerns? No

## 2016-12-01 ENCOUNTER — Ambulatory Visit: Payer: Medicare Other

## 2016-12-06 ENCOUNTER — Ambulatory Visit
Admission: RE | Admit: 2016-12-06 | Discharge: 2016-12-06 | Disposition: A | Discharge status: Home / Self Care | Payer: Medicare Other | Source: Ambulatory Visit | Attending: Pulmonary Disease | Admitting: Pulmonary Disease

## 2016-12-06 ENCOUNTER — Ambulatory Visit (INDEPENDENT_AMBULATORY_CARE_PROVIDER_SITE_OTHER): Payer: Medicare Other | Admitting: Pulmonary Disease

## 2016-12-06 ENCOUNTER — Encounter: Payer: Self-pay | Admitting: Pulmonary Disease

## 2016-12-06 VITALS — BP 128/74 | HR 89 | Ht 63.5 in | Wt 192.4 lb

## 2016-12-06 DIAGNOSIS — R918 Other nonspecific abnormal finding of lung field: Secondary | ICD-10-CM | POA: Diagnosis not present

## 2016-12-06 DIAGNOSIS — J9611 Chronic respiratory failure with hypoxia: Secondary | ICD-10-CM

## 2016-12-06 DIAGNOSIS — J84112 Idiopathic pulmonary fibrosis: Secondary | ICD-10-CM | POA: Diagnosis present

## 2016-12-06 MED ORDER — PREDNISONE 10 MG PO TABS: 10.0000 mg | ORAL_TABLET | Freq: Every day | ORAL | 5 refills | Status: DC

## 2016-12-06 NOTE — Progress Notes (Signed)
PULMONARY OFFICE FOLLOW UP NOTE  Requesting MD/Service: Caryl Bis, MD Date of initial consultation: 03/08/16 Reason for consultation: Pulmonary fibrosis  PT PROFILE: 66 y.o. F smoker referred 03/08/16 by Dr Caryl Bis for evaluation of pulmonary fibrosis.  She has a history of arthritis which had been labelled as RA in the past and she was on MTX briefly years ago. She has a history of prior DVT that occurred after LE injury.   DATA: 07/31/15: Right peroneal vein suspicious for intraluminal thrombus, although not well demonstrated. Otherwise, no other right lower extremity DVT. 01/14/16: No DVT on LE venous US 01/26/16 HRCT: Extensive peripheral basilar predominant reticulation, ground-glass attenuation and traction bronchiectasis in both lungs, with early honeycombing in the lower lobes. 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 Indeterminate irregular 1.2 cm nodular focus of consolidation in the basilar right lower lobe. This may represent part of the interstitial lung disease, however a true pulmonary nodule cannot be excluded.  03/08/16: Rheumatoid factor and ANA negative 04/27/16 PFTs: No obstruction, mild-mod restriction, mod decrease in DLCO 04/27/16 CXR: diffuse interstitial lung disease 08/16/16 CT chest: Right lower lobe pulmonary nodule measures slightly smaller although this difference may be technical only. Recommend an additional follow-up chest CT in 6 months to document longer term stability. Stable changes of presumed usual interstitial pneumonia No acute findings. 08/19/16 PFTs: No obstruction, mild-mod restriction, mod decrease in DLCO - [place] from prior study 09/26/16 Lung transplant eval @ [place]. Recommend weight loss of 10-25#. Pulm rehab initiated  10/21/16 Telephone encounter: reported severe diarrhea. Recommended holding OfevX 3 days, then once a day for 7 days, then full dose of twice a day if tolerating. 11/21/16 EGD, colonoscopy:  Patchy mild inflammation characterized by erythema was found in the duodenal bulb. There is a posterior bulbar/proximal second portion stenosis with mild inflammation at the site. 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 Unable to pass the scope beyond into the second and third portion of the duodenum. There is the appearance of possible previous duodenal ulcer disease Incomplete colon, reached rectosigmoid colon. Multiple polyps seen. 6 polyps retrieved, one not retrieved. 11/24/16 Acute office visit: 6-7 days of increased dyspnea, NP cough, subjective fevers and hypoxemia with SpO2 as low as 70s. Intolerant to Ofev due to GI issues. Prescribed course of Prednisone and Levofloxacin. 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 Discharged home on tapering dose of prednisone and end of course of Levofloxacin  SUBJ: Post hospital follow up as noted above. Since discharge she has improved some. She continues to have moderate to severe exertional dyspnea and continues to desaturate easily when off of O2. Denies CP, fever, purulent sputum, hemoptysis, LE edema and calf tenderness. She remains on prednisone @ 40 mg daily.      Vitals:   12/06/16 1149  BP: 128/74  Pulse: 89  SpO2: 92%  Weight: 192 lb 6.4 oz (87.3 kg)  Height: 5' 3.5" (1.613 m)  2 LPM [place]  EXAM:  Gen: NAD HEENT: WNL Neck: No JVD, LAN Lungs: Prominent B crackles, no wheezes Cardiovascular: Reg, no murmurs noted Abdomen: Soft, nontender, normal BS Ext: NO C/C/E Neuro: intact on limited exam   DATA:   BMP Latest Ref Rng & Units 11/26/2016 11/25/2016 06/30/2016  Glucose 65 - 99 mg/dL 164(H) 113(H) 83  BUN 6 - 20 mg/dL 16 14 10   Creatinine 0.44 - 1.00 mg/dL 0.97 0.86 1.10  BUN/Creat Ratio 11 - 26 - - -  Sodium 135 - 145 mmol/L 136 136 140  Potassium 3.5 - 5.1 mmol/L 3.7 3.6 4.3  Chloride 101 - 111 mmol/L  104 103 106  CO2 22 - 32 mmol/L 23 23 28   Calcium 8.9 - 10.3 mg/dL 7.9(L) 8.8(L) 9.5    CBC Latest Ref Rng & Units 11/26/2016 11/25/2016 06/02/2016  WBC 3.6 - 11.0 K/uL 8.8 14.3(H) 6.9  Hemoglobin 12.0 - 16.0 g/dL 10.9(L) 11.9(L) 12.5  Hematocrit 35.0 - 47.0 % 32.5(L) 35.5 37.3  Platelets 150 - 440 K/uL 351 340 253.0   CXR: diffuse ILD pattern. Perhaps some RUL clearing   IMPRESSION:   1) IPF - presumptive diagnosis based on CT findings 2) Chronic hypoxic respiratory failure 3) Recent AEIPF 4) Ofev intolerance   Current dose of prednisone is 40 mg daily   PLAN:  1) CXR ordered for today and reviewed above 2) Change prednisone to 20 mg daily for one week, then 10 mg daily until seen in office again 3) We will work on getting portable O2 4) form for handicap placard completed 5) ROV 3 weeks with PFTs prior  Merton Border, MD PCCM service Mobile [phone] Pager [phone] 12/06/2016

## 2016-12-06 NOTE — Patient Instructions (Addendum)
1) Chest Xray today - I will call you with the results  2) Decrease prednisone to 20 mg daily for one week (through 12/11/16), then 10 mg daily until you see me again  3) We will work to get you a portable oxygen concentrator  4) Handicap placard for your car  5) You may resume pulmonary rehab 12/19/16  6) Follow up with me in 3 weeks

## 2016-12-07 ENCOUNTER — Encounter: Payer: Medicare Other | Attending: Pulmonary Disease

## 2016-12-07 DIAGNOSIS — J84112 Idiopathic pulmonary fibrosis: Secondary | ICD-10-CM | POA: Insufficient documentation

## 2016-12-08 ENCOUNTER — Ambulatory Visit (INDEPENDENT_AMBULATORY_CARE_PROVIDER_SITE_OTHER): Payer: 59 | Admitting: Psychology

## 2016-12-08 ENCOUNTER — Ambulatory Visit: Payer: Medicare Other | Attending: Pulmonary Disease

## 2016-12-08 DIAGNOSIS — F4323 Adjustment disorder with mixed anxiety and depressed mood: Secondary | ICD-10-CM | POA: Diagnosis not present

## 2016-12-08 DIAGNOSIS — J84112 Idiopathic pulmonary fibrosis: Secondary | ICD-10-CM

## 2016-12-08 DIAGNOSIS — J9611 Chronic respiratory failure with hypoxia: Secondary | ICD-10-CM

## 2016-12-08 MED ORDER — ALBUTEROL SULFATE (2.5 MG/3ML) 0.083% IN NEBU
2.5000 mg | INHALATION_SOLUTION | Freq: Once | RESPIRATORY_TRACT | Status: AC
  Administered 2016-12-08: 2.5 mg via RESPIRATORY_TRACT
  Filled 2016-12-08: qty 3

## 2016-12-12 ENCOUNTER — Ambulatory Visit (INDEPENDENT_AMBULATORY_CARE_PROVIDER_SITE_OTHER): Payer: Medicare Other | Admitting: Family Medicine

## 2016-12-12 ENCOUNTER — Encounter: Payer: Self-pay | Admitting: Family Medicine

## 2016-12-12 DIAGNOSIS — J84112 Idiopathic pulmonary fibrosis: Secondary | ICD-10-CM

## 2016-12-12 MED ORDER — AZITHROMYCIN 250 MG PO TABS: ORAL_TABLET | ORAL | 0 refills | Status: DC

## 2016-12-12 NOTE — Progress Notes (Signed)
Pre visit review using our clinic review tool, if applicable. No additional management support is needed unless otherwise documented below in the visit note. 

## 2016-12-12 NOTE — Patient Instructions (Signed)
Nice to see you. We will start you on azithromycin to cover for potential infection. You should continue your prednisone. You should continue your oxygen. Someone will call you from the pulmonology office to get you set up for an appointment for evaluation. If you develop worsening trouble breathing, cough productive of blood, fevers, or any worsening or change in symptoms please seek medical attention immediately.

## 2016-12-12 NOTE — Progress Notes (Signed)
Tommi Rumps, MD Phone: [phone]  Kelli Morris is a 66 y.o. female who presents today for hospital follow-up.  Patient was previously hospitalized with an exacerbation of her pulmonary fibrosis. She was discharged on oxygen to use 2 L at rest and 4 L with exertion. She followed up with pulmonology one week ago and started to taper on her prednisone. She notes she has had slight increase in her dyspnea over the last week or so with oxygen saturations dropping to 78-79% range on exertion at home. She does note some mild cough that is stable. It is nonproductive. She notes the occasional chill over the last week. No documented fevers. No hemoptysis. She notes no shortness of breath when sitting still. Notes her oxygen does rebound into the normal range at rest. Today in the office it did drop to 79% as she was walking in though returned to the 90s with rest. Noted to be 94% when I checked in the office after she had been sitting. Chest x-ray last week revealed chronic interstitial prominence bilaterally. Slight patchy increased opacity that was previously noted in the right upper lobe and left lower lobe. Noted they could not rule out superimposed pneumonitis.  Patient notes they attempted to do a colonoscopy previously. They couldn't advance the scope farther than 25 cm. Notes they plan to do a virtual colonoscopy this week with prep starting today. She wonders if she should complete this this week.  PMH: Former smoker   ROS see history of present illness  Objective  Physical Exam Vitals:   12/12/16 1140 12/12/16 1226  BP: (!) 168/90 138/80  Pulse: 68   Temp: 97.9 F (36.6 C)     BP Readings from Last 3 Encounters:  12/12/16 138/80  12/06/16 128/74  11/27/16 120/66   Wt Readings from Last 3 Encounters:  12/12/16 187 lb (84.8 kg)  12/06/16 192 lb 6.4 oz (87.3 kg)  11/25/16 185 lb 11.2 oz (84.2 kg)    Physical Exam  Constitutional: No distress.  HENT:  Head: Normocephalic  and atraumatic.  Mouth/Throat: Oropharynx is clear and moist. No oropharyngeal exudate.  Cardiovascular: Normal rate, regular rhythm and normal heart sounds.   Pulmonary/Chest: Effort normal. No respiratory distress. She has no wheezes. She has no rales.  Minimal bibasilar crackles, fairly good air movement noted  Musculoskeletal: She exhibits no edema.  Neurological: She is alert. Gait normal.  Skin: She is not diaphoretic.     Assessment/Plan: Please see individual problem list.  Idiopathic pulmonary fibrosis ([place]) Patient seen in follow-up for recent hospitalization for exacerbation of pulmonary fibrosis. Currently on 2 L of oxygen with rest and 4 L of oxygen with exertion. Has had some slight increased dyspnea and oxygen desaturations over the last week. She does note she is having some desaturations prior to her follow-up with pulmonology last week though did drop lower recently. She is in no distress and has a relatively benign exam today with no focal findings on lung exam. She's been afebrile in the office though has noted some chills at home. Given the oxygen desaturations I contacted her pulmonologists office. I spoke with one of her pulmonologist's partners, Dr Mortimer Fries, regarding this history. Discussed that she does have chronic exertional dyspnea though it is slightly worse. Also discussed that her oxygen saturations dropped into the upper 70s with exertion though return to the normal range with rest. Described chills though no fevers. Nonproductive cough as well. Given patient history he recommended starting on azithromycin or Augmentin  as this may be the start of an infectious process. He advised getting her into see them in the next day or so that he would have their office staff contact the patient to set up an appointment. I also discussed with the patient going up to 5 L of oxygen with exertion. She will continue 20 mg of prednisone until she sees pulmonology. Azithromycin sent to  pharmacy. Discussed taking a probiotic with this. Discussed that if she has any worsening of symptoms, worsening shortness of breath, or develops fevers, cough productive of blood, chest pain, or any new or change in symptoms she should seek medical attention immediately through the emergency room. Additionally discussed minimizing exertion. Also discussed given what we are currently dealing with she should hold off on completing the virtual colonoscopy on Wednesday until she is feeling better. Medications reviewed today   No orders of the defined types were placed in this encounter.   Meds ordered this encounter  Medications  . azithromycin (ZITHROMAX) 250 MG tablet    Sig: Please take 500 mg (2 tablets) by mouth today, then take 250 mg (1 tablet) by mouth daily    Dispense:  6 tablet    Refill:  0    Tommi Rumps, MD [place]

## 2016-12-12 NOTE — Assessment & Plan Note (Addendum)
Patient seen in follow-up for recent hospitalization for exacerbation of pulmonary fibrosis. Currently on 2 L of oxygen with rest and 4 L of oxygen with exertion. Has had some slight increased dyspnea and oxygen desaturations over the last week. She does note she is having some desaturations prior to her follow-up with pulmonology last week though did drop lower recently. She is in no distress and has a relatively benign exam today with no focal findings on lung exam. She's been afebrile in the office though has noted some chills at home. Given the oxygen desaturations I contacted her pulmonologists office. I spoke with one of her pulmonologist's partners, Dr Mortimer Fries, regarding this history. Discussed that she does have chronic exertional dyspnea though it is slightly worse. Also discussed that her oxygen saturations dropped into the upper 70s with exertion though return to the normal range with rest. Described chills though no fevers. Nonproductive cough as well. Given patient history he recommended starting on azithromycin or Augmentin as this may be the start of an infectious process. He advised getting her into see them in the next day or so that he would have their office staff contact the patient to set up an appointment. I also discussed with the patient going up to 5 L of oxygen with exertion. She will continue 20 mg of prednisone until she sees pulmonology. Azithromycin sent to pharmacy. Discussed taking a probiotic with this. Discussed that if she has any worsening of symptoms, worsening shortness of breath, or develops fevers, cough productive of blood, chest pain, or any new or change in symptoms she should seek medical attention immediately through the emergency room. Additionally discussed minimizing exertion. Also discussed given what we are currently dealing with she should hold off on completing the virtual colonoscopy on Wednesday until she is feeling better. Medications reviewed today

## 2016-12-13 ENCOUNTER — Encounter: Payer: Self-pay | Admitting: Internal Medicine

## 2016-12-13 ENCOUNTER — Ambulatory Visit (INDEPENDENT_AMBULATORY_CARE_PROVIDER_SITE_OTHER): Payer: Medicare Other | Admitting: Internal Medicine

## 2016-12-13 VITALS — BP 144/88 | HR 71 | Wt 183.0 lb

## 2016-12-13 DIAGNOSIS — J9611 Chronic respiratory failure with hypoxia: Secondary | ICD-10-CM | POA: Diagnosis not present

## 2016-12-13 NOTE — Patient Instructions (Signed)
START Z PAK CONTINUE PREDNISONE 20 mg daily CONTACT COORDINATOR AT TRANSPLANT CENTER OXYGEN AS NEEDED DO NOT CHECK OXYGEN LEVELS FREQUENTLY

## 2016-12-13 NOTE — Progress Notes (Signed)
PULMONARY OFFICE FOLLOW UP NOTE  Requesting MD/Service: Caryl Bis, MD Date of initial consultation: 03/08/16 Reason for consultation: Pulmonary fibrosis  PT PROFILE: 66 y.o. F smoker referred 03/08/16 by Dr Caryl Bis for evaluation of pulmonary fibrosis.  She has a history of arthritis which had been labelled as RA in the past and she was on MTX briefly years ago. She has a history of prior DVT that occurred after LE injury.   DATA: 07/31/15: Right peroneal vein suspicious for intraluminal thrombus, although not well demonstrated. Otherwise, no other right lower extremity DVT. 01/14/16: No DVT on LE venous US 01/26/16 HRCT: Extensive peripheral basilar predominant reticulation, ground-glass attenuation and traction bronchiectasis in both lungs, with early honeycombing in the lower lobes. 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 Indeterminate irregular 1.2 cm nodular focus of consolidation in the basilar right lower lobe. This may represent part of the interstitial lung disease, however a true pulmonary nodule cannot be excluded.  03/08/16: Rheumatoid factor and ANA negative 04/27/16 PFTs: No obstruction, mild-mod restriction, mod decrease in DLCO 04/27/16 CXR: diffuse interstitial lung disease 08/16/16 CT chest: Right lower lobe pulmonary nodule measures slightly smaller although this difference may be technical only. Recommend an additional follow-up chest CT in 6 months to document longer term stability. Stable changes of presumed usual interstitial pneumonia No acute findings. 08/19/16 PFTs: No obstruction, mild-mod restriction, mod decrease in DLCO - [place] from prior study 09/26/16 Lung transplant eval @ [facility]. Recommend weight loss of 10-25#. Pulm rehab initiated  10/21/16 Telephone encounter: reported severe diarrhea. Recommended holding OfevX 3 days, then once a day for 7 days, then full dose of twice a day if tolerating. 11/21/16 EGD, colonoscopy:  Patchy mild inflammation characterized by erythema was found in the duodenal bulb. There is a posterior bulbar/proximal second portion stenosis with mild inflammation at the site. 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 Unable to pass the scope beyond into the second and third portion of the duodenum. There is the appearance of possible previous duodenal ulcer disease Incomplete colon, reached rectosigmoid colon. Multiple polyps seen. 6 polyps retrieved, one not retrieved. 11/24/16 Acute office visit: 6-7 days of increased dyspnea, NP cough, subjective fevers and hypoxemia with SpO2 as low as 70s. Intolerant to Ofev due to GI issues. Prescribed course of Prednisone and Levofloxacin. 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 Discharged home on tapering dose of prednisone and end of course of Levofloxacin  SUBJ: Patient has been having increased WOB and DOE Has increased fio2 requirements o2stast noted to be in 60's, patient is very anxious States she cannot tolerated neb therapy  She continues to have moderate to severe exertional dyspnea and continues to desaturate easily when off of O2.  Denies CP, fever, purulent sputum, hemoptysis, LE edema and calf tenderness. She remains on prednisone 20 mg daily.      Vitals:   12/13/16 1024  BP: (!) 144/88  Pulse: 71  SpO2: 94%  Weight: 183 lb (83 kg)  2 LPM [place]  EXAM:  Gen: NAD HEENT: WNL Neck: No JVD, LAN Lungs: Prominent B crackles, no wheezes Cardiovascular: Reg, no murmurs noted Abdomen: Soft, nontender, normal BS Ext: NO C/C/E Neuro: intact on limited exam   DATA:   BMP Latest Ref Rng & Units 11/26/2016 11/25/2016 06/30/2016  Glucose 65 - 99 mg/dL 164(H) 113(H) 83  BUN 6 - 20 mg/dL 16 14 10  Creatinine 0.44 - 1.00 mg/dL 0.97 0.86 1.10  BUN/Creat Ratio 11 - 26 - - -  Sodium 135 - 145 mmol/L 136 136 140   Potassium 3.5 - 5.1 mmol/L 3.7 3.6 4.3  Chloride 101 - 111 mmol/L 104 103 106  CO2 22 - 32 mmol/L 23 23 28   Calcium 8.9 - 10.3 mg/dL 7.9(L) 8.8(L) 9.5    CBC Latest Ref Rng & Units 11/26/2016 11/25/2016 06/02/2016  WBC 3.6 - 11.0 K/uL 8.8 14.3(H) 6.9  Hemoglobin 12.0 - 16.0 g/dL 10.9(L) 11.9(L) 12.5  Hematocrit 35.0 - 47.0 % 32.5(L) 35.5 37.3  Platelets 150 - 440 K/uL 351 340 253.0   CXR: diffuse ILD pattern. 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 Continue oxygen as needed.   I have personally obtained a history, examined the patient, evaluated Pertinent laboratory and [employer]/imaging results, and  formulated the assessment and plan   The Patient requires high complexity decision making for assessment and support, frequent evaluation and titration of therapies.   Patient/Family are satisfied with Plan of action and management. All questions answered  Corrin Parker, M.D.  [facility]  Medical Director [place] Director [facility] Cardio-Pulmonary Department

## 2016-12-14 ENCOUNTER — Ambulatory Visit: Payer: Medicare Other

## 2016-12-16 ENCOUNTER — Other Ambulatory Visit: Payer: Self-pay

## 2016-12-16 ENCOUNTER — Telehealth: Payer: Self-pay | Admitting: *Deleted

## 2016-12-16 ENCOUNTER — Inpatient Hospital Stay
Admission: EM | Admit: 2016-12-16 | Discharge: 2016-12-26 | DRG: 196 | Disposition: A | Discharge status: Hospice | Payer: Medicare Other | Attending: Internal Medicine | Admitting: Internal Medicine

## 2016-12-16 ENCOUNTER — Emergency Department: Payer: Medicare Other

## 2016-12-16 ENCOUNTER — Encounter: Payer: Self-pay | Admitting: Emergency Medicine

## 2016-12-16 DIAGNOSIS — R1030 Lower abdominal pain, unspecified: Secondary | ICD-10-CM | POA: Diagnosis not present

## 2016-12-16 DIAGNOSIS — J9600 Acute respiratory failure, unspecified whether with hypoxia or hypercapnia: Secondary | ICD-10-CM | POA: Diagnosis present

## 2016-12-16 DIAGNOSIS — N281 Cyst of kidney, acquired: Secondary | ICD-10-CM | POA: Diagnosis present

## 2016-12-16 DIAGNOSIS — M797 Fibromyalgia: Secondary | ICD-10-CM | POA: Diagnosis present

## 2016-12-16 DIAGNOSIS — R0600 Dyspnea, unspecified: Secondary | ICD-10-CM

## 2016-12-16 DIAGNOSIS — M6281 Muscle weakness (generalized): Secondary | ICD-10-CM

## 2016-12-16 DIAGNOSIS — J84112 Idiopathic pulmonary fibrosis: Secondary | ICD-10-CM | POA: Diagnosis present

## 2016-12-16 DIAGNOSIS — F419 Anxiety disorder, unspecified: Secondary | ICD-10-CM | POA: Diagnosis present

## 2016-12-16 DIAGNOSIS — Z8049 Family history of malignant neoplasm of other genital organs: Secondary | ICD-10-CM

## 2016-12-16 DIAGNOSIS — Z90710 Acquired absence of both cervix and uterus: Secondary | ICD-10-CM | POA: Diagnosis not present

## 2016-12-16 DIAGNOSIS — R52 Pain, unspecified: Secondary | ICD-10-CM

## 2016-12-16 DIAGNOSIS — F329 Major depressive disorder, single episode, unspecified: Secondary | ICD-10-CM | POA: Diagnosis present

## 2016-12-16 DIAGNOSIS — Z825 Family history of asthma and other chronic lower respiratory diseases: Secondary | ICD-10-CM

## 2016-12-16 DIAGNOSIS — Z7189 Other specified counseling: Secondary | ICD-10-CM | POA: Diagnosis not present

## 2016-12-16 DIAGNOSIS — Z8249 Family history of ischemic heart disease and other diseases of the circulatory system: Secondary | ICD-10-CM

## 2016-12-16 DIAGNOSIS — J8410 Pulmonary fibrosis, unspecified: Secondary | ICD-10-CM | POA: Diagnosis not present

## 2016-12-16 DIAGNOSIS — J9601 Acute respiratory failure with hypoxia: Secondary | ICD-10-CM | POA: Diagnosis not present

## 2016-12-16 DIAGNOSIS — R109 Unspecified abdominal pain: Secondary | ICD-10-CM

## 2016-12-16 DIAGNOSIS — E039 Hypothyroidism, unspecified: Secondary | ICD-10-CM | POA: Diagnosis present

## 2016-12-16 DIAGNOSIS — K429 Umbilical hernia without obstruction or gangrene: Secondary | ICD-10-CM | POA: Diagnosis present

## 2016-12-16 DIAGNOSIS — J9621 Acute and chronic respiratory failure with hypoxia: Secondary | ICD-10-CM | POA: Diagnosis present

## 2016-12-16 DIAGNOSIS — Z8542 Personal history of malignant neoplasm of other parts of uterus: Secondary | ICD-10-CM

## 2016-12-16 DIAGNOSIS — Z515 Encounter for palliative care: Secondary | ICD-10-CM | POA: Diagnosis not present

## 2016-12-16 DIAGNOSIS — I4891 Unspecified atrial fibrillation: Secondary | ICD-10-CM | POA: Diagnosis present

## 2016-12-16 DIAGNOSIS — Z806 Family history of leukemia: Secondary | ICD-10-CM

## 2016-12-16 DIAGNOSIS — R911 Solitary pulmonary nodule: Secondary | ICD-10-CM | POA: Diagnosis present

## 2016-12-16 DIAGNOSIS — K581 Irritable bowel syndrome with constipation: Secondary | ICD-10-CM | POA: Diagnosis present

## 2016-12-16 DIAGNOSIS — E785 Hyperlipidemia, unspecified: Secondary | ICD-10-CM | POA: Diagnosis present

## 2016-12-16 DIAGNOSIS — I2510 Atherosclerotic heart disease of native coronary artery without angina pectoris: Secondary | ICD-10-CM | POA: Diagnosis present

## 2016-12-16 DIAGNOSIS — Z66 Do not resuscitate: Secondary | ICD-10-CM | POA: Diagnosis not present

## 2016-12-16 DIAGNOSIS — R630 Anorexia: Secondary | ICD-10-CM | POA: Diagnosis not present

## 2016-12-16 DIAGNOSIS — Z8711 Personal history of peptic ulcer disease: Secondary | ICD-10-CM

## 2016-12-16 DIAGNOSIS — I119 Hypertensive heart disease without heart failure: Secondary | ICD-10-CM | POA: Diagnosis present

## 2016-12-16 DIAGNOSIS — Z87891 Personal history of nicotine dependence: Secondary | ICD-10-CM

## 2016-12-16 DIAGNOSIS — Z6832 Body mass index (BMI) 32.0-32.9, adult: Secondary | ICD-10-CM

## 2016-12-16 DIAGNOSIS — J189 Pneumonia, unspecified organism: Secondary | ICD-10-CM | POA: Diagnosis present

## 2016-12-16 DIAGNOSIS — R112 Nausea with vomiting, unspecified: Secondary | ICD-10-CM | POA: Diagnosis not present

## 2016-12-16 DIAGNOSIS — J470 Bronchiectasis with acute lower respiratory infection: Secondary | ICD-10-CM | POA: Diagnosis present

## 2016-12-16 DIAGNOSIS — K219 Gastro-esophageal reflux disease without esophagitis: Secondary | ICD-10-CM | POA: Diagnosis present

## 2016-12-16 DIAGNOSIS — J80 Acute respiratory distress syndrome: Secondary | ICD-10-CM | POA: Diagnosis not present

## 2016-12-16 DIAGNOSIS — Z86718 Personal history of other venous thrombosis and embolism: Secondary | ICD-10-CM

## 2016-12-16 DIAGNOSIS — R0603 Acute respiratory distress: Secondary | ICD-10-CM

## 2016-12-16 LAB — BASIC METABOLIC PANEL
Anion gap: 11 (ref 5–15)
BUN: 13 mg/dL (ref 6–20)
CO2: 23 mmol/L (ref 22–32)
Calcium: 8.6 mg/dL — ABNORMAL LOW (ref 8.9–10.3)
Chloride: 101 mmol/L (ref 101–111)
Creatinine, Ser: 0.91 mg/dL (ref 0.44–1.00)
GFR calc Af Amer: >60 mL/min (ref >60)
GLUCOSE: 136 mg/dL — AB (ref 65–99)
POTASSIUM: 4.1 mmol/L (ref 3.5–5.1)
Sodium: 135 mmol/L (ref 135–145)

## 2016-12-16 LAB — MRSA PCR SCREENING: MRSA by PCR: NEGATIVE

## 2016-12-16 LAB — CBC WITH DIFFERENTIAL/PLATELET
Basophils Absolute: 0 10*3/uL (ref 0–0.1)
Basophils Relative: 0 %
EOS PCT: 0 %
Eosinophils Absolute: 0 10*3/uL (ref 0–0.7)
HCT: 33.3 % — ABNORMAL LOW (ref 35.0–47.0)
Hemoglobin: 11.4 g/dL — ABNORMAL LOW (ref 12.0–16.0)
LYMPHS ABS: 0.4 10*3/uL — AB (ref 1.0–3.6)
LYMPHS PCT: 3 %
MCH: 31.6 pg (ref 26.0–34.0)
MCHC: 34.1 g/dL (ref 32.0–36.0)
MCV: 92.5 fL (ref 80.0–100.0)
Monocytes Absolute: 0.2 10*3/uL (ref 0.2–0.9)
Monocytes Relative: 2 %
Neutro Abs: 11.7 10*3/uL — ABNORMAL HIGH (ref 1.4–6.5)
Neutrophils Relative %: 95 %
PLATELETS: 235 10*3/uL (ref 150–440)
RBC: 3.6 MIL/uL — AB (ref 3.80–5.20)
RDW: 14.8 % — ABNORMAL HIGH (ref 11.5–14.5)
WBC: 12.3 10*3/uL — AB (ref 3.6–11.0)

## 2016-12-16 LAB — EXPECTORATED SPUTUM ASSESSMENT W GRAM STAIN, RFLX TO RESP C

## 2016-12-16 LAB — GLUCOSE, CAPILLARY
GLUCOSE-CAPILLARY: 152 mg/dL — AB (ref 65–99)
Glucose-Capillary: 165 mg/dL — ABNORMAL HIGH (ref 65–99)

## 2016-12-16 LAB — BRAIN NATRIURETIC PEPTIDE: B Natriuretic Peptide: 122 pg/mL — ABNORMAL HIGH (ref 0.0–100.0)

## 2016-12-16 LAB — EXPECTORATED SPUTUM ASSESSMENT W REFEX TO RESP CULTURE

## 2016-12-16 LAB — INFLUENZA PANEL BY PCR (TYPE A & B)
INFLBPCR: NEGATIVE
Influenza A By PCR: NEGATIVE

## 2016-12-16 LAB — PROCALCITONIN: Procalcitonin: 0.25 ng/mL

## 2016-12-16 LAB — LACTIC ACID, PLASMA: Lactic Acid, Venous: 1 mmol/L (ref 0.5–1.9)

## 2016-12-16 LAB — TROPONIN I: Troponin I: <0.03 ng/mL (ref <0.03)

## 2016-12-16 MED ORDER — ACETAMINOPHEN 325 MG PO TABS
650.0000 mg | ORAL_TABLET | Freq: Four times a day (QID) | ORAL | Status: DC | PRN
  Administered 2016-12-17 – 2016-12-22 (×6): 650 mg via ORAL
  Filled 2016-12-16 (×8): qty 2

## 2016-12-16 MED ORDER — INSULIN ASPART 100 UNIT/ML ~~LOC~~ SOLN
0.0000 [IU] | Freq: Every day | SUBCUTANEOUS | Status: DC
Start: 2016-12-16 — End: 2016-12-26
  Administered 2016-12-24: 2 [IU] via SUBCUTANEOUS
  Filled 2016-12-16: qty 2

## 2016-12-16 MED ORDER — VANCOMYCIN HCL IN DEXTROSE 1-5 GM/200ML-% IV SOLN
1000.0000 mg | Freq: Once | INTRAVENOUS | Status: AC
  Administered 2016-12-16: 1000 mg via INTRAVENOUS
  Filled 2016-12-16: qty 200

## 2016-12-16 MED ORDER — IPRATROPIUM-ALBUTEROL 0.5-2.5 (3) MG/3ML IN SOLN
3.0000 mL | Freq: Four times a day (QID) | RESPIRATORY_TRACT | Status: DC
  Administered 2016-12-16 – 2016-12-26 (×36): 3 mL via RESPIRATORY_TRACT
  Filled 2016-12-16 (×39): qty 3

## 2016-12-16 MED ORDER — SODIUM CHLORIDE 0.9 % IV SOLN: 250.0000 mL | INTRAVENOUS | Status: DC | PRN

## 2016-12-16 MED ORDER — ACETAMINOPHEN 500 MG PO TABS
1000.0000 mg | ORAL_TABLET | Freq: Once | ORAL | Status: AC
  Administered 2016-12-16: 1000 mg via ORAL
  Filled 2016-12-16: qty 2

## 2016-12-16 MED ORDER — BUDESONIDE 0.5 MG/2ML IN SUSP
0.5000 mg | Freq: Two times a day (BID) | RESPIRATORY_TRACT | Status: DC
  Administered 2016-12-16 – 2016-12-26 (×19): 0.5 mg via RESPIRATORY_TRACT
  Filled 2016-12-16 (×21): qty 2

## 2016-12-16 MED ORDER — ALPRAZOLAM 0.5 MG PO TABS
0.5000 mg | ORAL_TABLET | Freq: Two times a day (BID) | ORAL | Status: DC | PRN
  Administered 2016-12-16 – 2016-12-18 (×4): 0.5 mg via ORAL
  Filled 2016-12-16 (×3): qty 1

## 2016-12-16 MED ORDER — VANCOMYCIN HCL IN DEXTROSE 750-5 MG/150ML-% IV SOLN
750.0000 mg | Freq: Two times a day (BID) | INTRAVENOUS | Status: DC
  Administered 2016-12-16 – 2016-12-17 (×2): 750 mg via INTRAVENOUS
  Filled 2016-12-16 (×4): qty 150

## 2016-12-16 MED ORDER — ASPIRIN-CAFFEINE 1000-65 MG PO PACK: 800.0000 mg | PACK | Freq: Four times a day (QID) | ORAL | Status: DC

## 2016-12-16 MED ORDER — ATORVASTATIN CALCIUM 20 MG PO TABS
40.0000 mg | ORAL_TABLET | Freq: Every day | ORAL | Status: DC
  Administered 2016-12-16 – 2016-12-25 (×10): 40 mg via ORAL
  Filled 2016-12-16 (×10): qty 2

## 2016-12-16 MED ORDER — SODIUM CHLORIDE 0.9% FLUSH
3.0000 mL | INTRAVENOUS | Status: DC | PRN
  Administered 2016-12-22: 3 mL via INTRAVENOUS
  Filled 2016-12-16: qty 3

## 2016-12-16 MED ORDER — HYDROCODONE-ACETAMINOPHEN 5-325 MG PO TABS
1.0000 | ORAL_TABLET | ORAL | Status: DC | PRN
  Administered 2016-12-17 – 2016-12-19 (×2): 1 via ORAL
  Administered 2016-12-19 (×2): 2 via ORAL
  Administered 2016-12-20 – 2016-12-26 (×18): 1 via ORAL
  Filled 2016-12-16 (×2): qty 1
  Filled 2016-12-16: qty 2
  Filled 2016-12-16 (×10): qty 1
  Filled 2016-12-16: qty 2
  Filled 2016-12-16 (×8): qty 1

## 2016-12-16 MED ORDER — METHYLPREDNISOLONE SODIUM SUCC 125 MG IJ SOLR
60.0000 mg | Freq: Four times a day (QID) | INTRAMUSCULAR | Status: DC
  Administered 2016-12-16 – 2016-12-17 (×3): 60 mg via INTRAVENOUS
  Filled 2016-12-16 (×4): qty 2

## 2016-12-16 MED ORDER — ALPRAZOLAM 0.5 MG PO TABS
ORAL_TABLET | ORAL | Status: AC
  Administered 2016-12-16: 0.5 mg via ORAL
  Filled 2016-12-16: qty 1

## 2016-12-16 MED ORDER — PIPERACILLIN-TAZOBACTAM 3.375 G IVPB
3.3750 g | Freq: Once | INTRAVENOUS | Status: AC
  Administered 2016-12-16: 3.375 g via INTRAVENOUS
  Filled 2016-12-16: qty 50

## 2016-12-16 MED ORDER — ASPIRIN 325 MG PO TABS
650.0000 mg | ORAL_TABLET | Freq: Four times a day (QID) | ORAL | Status: DC
  Administered 2016-12-16 – 2016-12-20 (×15): 650 mg via ORAL
  Filled 2016-12-16 (×16): qty 2

## 2016-12-16 MED ORDER — FUROSEMIDE 10 MG/ML IJ SOLN: 40.0000 mg | INTRAMUSCULAR | Status: DC

## 2016-12-16 MED ORDER — PIPERACILLIN-TAZOBACTAM 3.375 G IVPB
3.3750 g | Freq: Three times a day (TID) | INTRAVENOUS | Status: DC
  Administered 2016-12-16 – 2016-12-20 (×12): 3.375 g via INTRAVENOUS
  Filled 2016-12-16 (×12): qty 50

## 2016-12-16 MED ORDER — ONDANSETRON HCL 4 MG/2ML IJ SOLN
4.0000 mg | Freq: Four times a day (QID) | INTRAMUSCULAR | Status: DC | PRN
  Administered 2016-12-23 – 2016-12-24 (×2): 4 mg via INTRAVENOUS
  Filled 2016-12-16 (×4): qty 2

## 2016-12-16 MED ORDER — ENOXAPARIN SODIUM 40 MG/0.4ML ~~LOC~~ SOLN
40.0000 mg | SUBCUTANEOUS | Status: DC
  Administered 2016-12-16 – 2016-12-25 (×10): 40 mg via SUBCUTANEOUS
  Filled 2016-12-16 (×10): qty 0.4

## 2016-12-16 MED ORDER — IBUPROFEN 200 MG PO TABS
200.0000 mg | ORAL_TABLET | Freq: Four times a day (QID) | ORAL | Status: DC | PRN
  Administered 2016-12-16 – 2016-12-20 (×3): 200 mg via ORAL
  Filled 2016-12-16 (×6): qty 1

## 2016-12-16 MED ORDER — INSULIN ASPART 100 UNIT/ML ~~LOC~~ SOLN
0.0000 [IU] | Freq: Three times a day (TID) | SUBCUTANEOUS | Status: DC
  Administered 2016-12-16: 3 [IU] via SUBCUTANEOUS
  Administered 2016-12-17: 1 [IU] via SUBCUTANEOUS
  Administered 2016-12-18 – 2016-12-19 (×3): 2 [IU] via SUBCUTANEOUS
  Administered 2016-12-19 – 2016-12-21 (×3): 3 [IU] via SUBCUTANEOUS
  Administered 2016-12-21 – 2016-12-23 (×4): 2 [IU] via SUBCUTANEOUS
  Administered 2016-12-24 – 2016-12-25 (×3): 3 [IU] via SUBCUTANEOUS
  Administered 2016-12-25: 2 [IU] via SUBCUTANEOUS
  Filled 2016-12-16: qty 2
  Filled 2016-12-16: qty 3
  Filled 2016-12-16: qty 2
  Filled 2016-12-16: qty 3
  Filled 2016-12-16 (×2): qty 2
  Filled 2016-12-16: qty 3
  Filled 2016-12-16: qty 2
  Filled 2016-12-16: qty 3
  Filled 2016-12-16: qty 2
  Filled 2016-12-16 (×2): qty 3
  Filled 2016-12-16: qty 2
  Filled 2016-12-16 (×2): qty 3
  Filled 2016-12-16: qty 2

## 2016-12-16 MED ORDER — ONDANSETRON HCL 4 MG PO TABS
4.0000 mg | ORAL_TABLET | Freq: Four times a day (QID) | ORAL | Status: DC | PRN
  Administered 2016-12-22 – 2016-12-25 (×2): 4 mg via ORAL
  Filled 2016-12-16: qty 1

## 2016-12-16 MED ORDER — SODIUM CHLORIDE 0.9% FLUSH
3.0000 mL | Freq: Two times a day (BID) | INTRAVENOUS | Status: DC
  Administered 2016-12-16 – 2016-12-26 (×18): 3 mL via INTRAVENOUS

## 2016-12-16 MED ORDER — LEVOTHYROXINE SODIUM 25 MCG PO TABS
25.0000 ug | ORAL_TABLET | Freq: Every day | ORAL | Status: DC
  Administered 2016-12-17 – 2016-12-26 (×9): 25 ug via ORAL
  Filled 2016-12-16 (×8): qty 1

## 2016-12-16 MED ORDER — BUDESONIDE 0.25 MG/2ML IN SUSP: 0.2500 mg | Freq: Two times a day (BID) | RESPIRATORY_TRACT | Status: DC

## 2016-12-16 MED ORDER — PANTOPRAZOLE SODIUM 40 MG PO TBEC
40.0000 mg | DELAYED_RELEASE_TABLET | Freq: Two times a day (BID) | ORAL | Status: DC
  Administered 2016-12-16 – 2016-12-21 (×11): 40 mg via ORAL
  Filled 2016-12-16 (×12): qty 1

## 2016-12-16 MED ORDER — SODIUM CHLORIDE 0.9 % IV SOLN
250.0000 mg | Freq: Once | INTRAVENOUS | Status: AC
  Administered 2016-12-16: 250 mg via INTRAVENOUS
  Filled 2016-12-16 (×2): qty 4

## 2016-12-16 MED ORDER — IOPAMIDOL (ISOVUE-300) INJECTION 61%
75.0000 mL | Freq: Once | INTRAVENOUS | Status: AC | PRN
  Administered 2016-12-16: 75 mL via INTRAVENOUS

## 2016-12-16 MED ORDER — SERTRALINE HCL 50 MG PO TABS
50.0000 mg | ORAL_TABLET | Freq: Every day | ORAL | Status: DC
  Administered 2016-12-16 – 2016-12-26 (×11): 50 mg via ORAL
  Filled 2016-12-16 (×11): qty 1

## 2016-12-16 NOTE — ED Notes (Signed)
Patient transported to CT 

## 2016-12-16 NOTE — Progress Notes (Signed)
Pharmacy Antibiotic Note  Kelli Morris is a 66 y.o. female admitted on 12/16/2016 with pneumonia.  Pharmacy has been consulted for piperacillin/tazobactam and vancomycin dosing.  Plan: Piperacillin/tazobactam 3.375 g IV q8h EI  Vancomycin 1000 mg dose received in ED. Will order vancomycin 750 mg IV q12h (7 hour stacked dose) Vancomycin trough 15-20 mcg/mL Vancomycin trough ordered for 1/14 at 0730  Kinetics: Adjusted body weight = 64 kg Ke: 0.057 Half-life: ~12 hrs Vd: 45 L Cmin = 16.5 mcg/mL estimated  MRSA PCR and PCT ordered. Recommend discontinuation of vancomycin if MRSA PCR is negative.   Weight: 183 lb (83 kg)  Temp (24hrs), Avg:98.8 F (37.1 C), Min:98.8 F (37.1 C), Max:98.8 F (37.1 C)   Recent Labs Lab 12/16/16 1033  WBC 12.3*  CREATININE 0.91    Estimated Creatinine Clearance: 63.6 mL/min (by C-G formula based on SCr of 0.91 mg/dL).    No Known Allergies  Antimicrobials this admission: vancomycin 1/12 >>  Piperacillin/tazobactam 1/12 >>   Dose adjustments this admission:  Microbiology results:  Thank you for allowing pharmacy to be a part of this patient's care.  Lenis Noon, PharmD, BCPS Clinical Pharmacist 12/16/2016 1:45 PM

## 2016-12-16 NOTE — ED Provider Notes (Signed)
[facility] Emergency Department Provider Note        Time seen: ----------------------------------------- 10:03 AM on 12/16/2016 -----------------------------------------    I have reviewed the triage vital signs and the nursing notes.   HISTORY  Chief Complaint Shortness of Breath    HPI Kelli Morris is a 66 y.o. female who presents to the ER for shortness of breath, cough and fever.Patient states at home she had low oxygen saturations. She has a history of pulmonary fibrosis and was recently admitted into the hospital for same. She is always on around 2-1/2-3 L of nasal cannula oxygen. In triage she arrives 94% on 5 L. Took Tylenol this morning at 9 AM. She denies other complaints at this time.   Past Medical History:  Diagnosis Date  . Arrhythmia    afib  . Cancer ([place])    uterine  . Fibromyalgia    Chronic fatigue  . GERD (gastroesophageal reflux disease)   . Headache(784.0)   . History of headache   . History of low back pain   . Hyperlipidemia   . Hypertension   . Hypothyroidism   . Idiopathic pulmonary fibrosis ([place])   . Lyme disease   . Pulmonary fibrosis ([place])   . PVD (peripheral vascular disease) ([place])   . Strain, cervical   . Thrombosis   . Tobacco use     Patient Active Problem List   Diagnosis Date Noted  . Obesity 09/30/2016  . Hyperparathyroidism ([place]) 09/30/2016  . Fibromyalgia 08/15/2016  . Osteoporosis 06/30/2016  . Elevated TSH 06/30/2016  . Esophageal reflux 06/30/2016  . Cellulitis 05/31/2016  . Elevated vitamin B12 level 05/31/2016  . Lightheadedness 05/31/2016  . Idiopathic pulmonary fibrosis ([place]) 05/18/2016  . Constipation 05/18/2016  . Vitamin B 12 deficiency 05/18/2016  . Onychomycosis 05/18/2016  . Chronic headaches 01/17/2016  . History of pneumonia 01/17/2016  . Anxiety 01/17/2016  . Hyperlipidemia 12/15/2015  . Right leg DVT ([place]) 09/11/2015  . Right leg swelling 07/31/2015  . PAD (peripheral  artery disease) ([place]) 07/31/2015  . Dizziness 11/09/2013  . Chest heaviness 07/04/2013  . SOB (shortness of breath) 07/04/2013  . Claudication ([place]) 07/04/2013  . Bilateral leg edema 07/04/2013  . Tobacco use   . Encounter for therapeutic drug monitoring 01/25/2013  . Pain in limb 01/25/2013  . Lumbosacral spondylosis without myelopathy 01/25/2013  . Spinal stenosis, unspecified region other than cervical 01/25/2013  . Meningitis, unspecified(322.9) 01/25/2013  . Chronic fatigue syndrome 01/25/2013  . Myalgia and myositis, unspecified 01/25/2013    Past Surgical History:  Procedure Laterality Date  . ABDOMINAL HYSTERECTOMY    . BREAST BIOPSY Left 1990  . COLONOSCOPY WITH PROPOFOL N/A 11/21/2016   Procedure: COLONOSCOPY WITH PROPOFOL;  Surgeon: Lollie Sails, MD;  Location: [facility] ENDOSCOPY;  Service: Endoscopy;  Laterality: N/A;  . ESOPHAGOGASTRODUODENOSCOPY (EGD) WITH PROPOFOL N/A 11/21/2016   Procedure: ESOPHAGOGASTRODUODENOSCOPY (EGD) WITH PROPOFOL;  Surgeon: Lollie Sails, MD;  Location: [facility] ENDOSCOPY;  Service: Endoscopy;  Laterality: N/A;  . LUMBAR SPINE SURGERY    . OVARY SURGERY    . TENDON REPAIR Right    Hand  . TONSILLECTOMY    . TUBAL LIGATION Bilateral   . ULNAR NERVE TRANSPOSITION Left   . VAGINA SURGERY     biopsy  . VULVECTOMY PARTIAL      Allergies Patient has no known allergies.  Social History Social History  Substance Use Topics  . Smoking status: Former Smoker    Packs/day: 0.50  Years: 45.00    Types: Cigarettes  . Smokeless tobacco: Never Used  . Alcohol use Yes     Comment: Consumes alcohol on occasion    Review of Systems Constitutional: Positive for fever Cardiovascular: Negative for chest pain. Respiratory: Positive for shortness of breath and cough Gastrointestinal: Negative for abdominal pain, vomiting and diarrhea. Genitourinary: Negative for dysuria. Musculoskeletal: Negative for back pain. Skin: Negative for  rash. Neurological: Negative for headaches, positive for generalized weakness  10-point ROS otherwise negative.  ____________________________________________   PHYSICAL EXAM:  VITAL SIGNS: ED Triage Vitals  Enc Vitals Group     BP 12/16/16 0946 126/72     Pulse Rate 12/16/16 0946 (!) 115     Resp 12/16/16 0946 (!) 28     Temp 12/16/16 0946 98.8 F (37.1 C)     Temp Source 12/16/16 0946 Oral     SpO2 12/16/16 0946 94 %     Weight 12/16/16 0946 183 lb (83 kg)     Height --      Head Circumference --      Peak Flow --      Pain Score 12/16/16 0947 5     Pain Loc --      Pain Edu? --      Excl. in [place]? --     Constitutional: Alert and oriented. Mild distress Eyes: Conjunctivae are normal. PERRL. Normal extraocular movements. ENT   Head: Normocephalic and atraumatic.   Nose: No congestion/rhinnorhea.   Mouth/Throat: Mucous membranes are moist.   Neck: No stridor. Cardiovascular: Normal rate, regular rhythm. No murmurs, rubs, or gallops. Respiratory: Tachypnea with rales in bilateral bases Gastrointestinal: Soft and nontender. Normal bowel sounds Musculoskeletal: Nontender with normal range of motion in all extremities. No lower extremity tenderness nor edema. Neurologic:  Normal speech and language. No gross focal neurologic deficits are appreciated.  Skin:  Skin is warm, dry and intact. No rash noted. Psychiatric: Mood and affect are normal. Speech and behavior are normal.  ____________________________________________  EKG: Interpreted by me. Sinus tachycardia with a rate of 118 bpm, PACs, normal PR interval, normal QRS size, normal QT, nonspecific T wave changes  ____________________________________________  ED COURSE:  Pertinent labs & imaging results that were available during my care of the patient were reviewed by me and considered in my medical decision making (see chart for details). Clinical Course as of Dec 17 1251  Fri Dec 16, 2016  1207 Case  was discussed with Dr. Jamal Collin from pulmonology. He states he will consult on her this afternoon  [JW]    Clinical Course User Index [JW] Earleen Newport, MD  Patient presents to ER with dyspnea. We will assess with labs and imaging.  Procedures ____________________________________________   LABS (pertinent positives/negatives)  Labs Reviewed  CBC WITH DIFFERENTIAL/PLATELET - Abnormal; Notable for the following:       Result Value   WBC 12.3 (*)    RBC 3.60 (*)    Hemoglobin 11.4 (*)    HCT 33.3 (*)    RDW 14.8 (*)    Neutro Abs 11.7 (*)    Lymphs Abs 0.4 (*)    All other components within normal limits  BASIC METABOLIC PANEL - Abnormal; Notable for the following:    Glucose, Bld 136 (*)    Calcium 8.6 (*)    All other components within normal limits  BRAIN NATRIURETIC PEPTIDE - Abnormal; Notable for the following:    B Natriuretic Peptide 122.0 (*)    All  other components within normal limits  CULTURE, EXPECTORATED SPUTUM-ASSESSMENT  TROPONIN I  INFLUENZA PANEL BY PCR (TYPE A & B, H1N1)  LACTIC ACID, PLASMA  LACTIC ACID, PLASMA    RADIOLOGY Images were viewed by me  Chest x-ray IMPRESSION: Progressive bilateral pulmonary infiltrates consistent with pulmonary edema and/or pneumonia. Persistent cardiomegaly . IMPRESSION: 1. Advanced fibrotic interstitial lung disease characterized by extensive reticulation and ground-glass attenuation involving the peribronchovascular and subpleural lungs with associated traction bronchiectasis, architectural distortion and mild honeycombing. These findings have progressed since the 08/16/2016 chest CT, particularly the ground-glass component. A superimposed component of pulmonary edema is not excluded given the new borderline mild cardiomegaly. 2. Stable basilar right lower lobe 1.2 cm solid pulmonary nodule, for which 10 month stability has been demonstrated, probably benign. Recommend attention on follow-up chest CT in 6-12  months. 3. Stable mediastinal and bilateral hilar adenopathy, most consistent with benign reactive adenopathy. 4. Aortic atherosclerosis. Three-vessel coronary atherosclerosis. ____________________________________________  FINAL ASSESSMENT AND PLAN  Dyspnea, Pulmonary fibrosis, possible pneumonia  Plan: Patient with labs and imaging as dictated above. Patient presents to the ER for worsening shortness of breath, having failed outpatient Levaquin and azithromycin. She does have advancing pulmonary fibrosis, the treatment for which will be unclear if any. I have ordered IV antibiotics and discussed with her pulmonologist. She is stable for admission at this time.   Earleen Newport, MD   Note: This dictation was prepared with Dragon dictation. Any transcriptional errors that result from this process are unintentional    Earleen Newport, MD 12/16/16 1254

## 2016-12-16 NOTE — Consult Note (Signed)
PULMONARY CONSULT NOTE   Requesting MD/Service: Posey Pronto, MD Date of initial consultation: 03/08/16 Reason for consultation: Pulmonary fibrosis  PT PROFILE: 66 y.o. F smoker referred 03/08/16 by Dr Caryl Bis for evaluation of pulmonary fibrosis.  She has a history of arthritis which had been labelled as RA in the past and she was on MTX briefly years ago. She has a history of prior DVT that occurred after LE injury.   DATA: 07/31/15: Right peroneal vein suspicious for intraluminal thrombus, although not well demonstrated. Otherwise, no other right lower extremity DVT. 01/14/16: No DVT on LE venous US 01/26/16 HRCT: Extensive peripheral basilar predominant reticulation, ground-glass attenuation and traction bronchiectasis in both lungs, with early honeycombing in the lower lobes. 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 Indeterminate irregular 1.2 cm nodular focus of consolidation in the basilar right lower lobe. This may represent part of the interstitial lung disease, however a true pulmonary nodule cannot be excluded.  03/08/16: Rheumatoid factor and ANA negative 04/27/16 PFTs: No obstruction, mild-mod restriction, mod decrease in DLCO 04/27/16 CXR: diffuse interstitial lung disease 08/16/16 CT chest: Right lower lobe pulmonary nodule measures slightly smaller although this difference may be technical only. Recommend an additional follow-up chest CT in 6 months to document longer term stability. Stable changes of presumed usual interstitial pneumonia No acute findings. 08/19/16 PFTs: No obstruction, mild-mod restriction, mod decrease in DLCO - [place] from prior study 09/26/16 Lung transplant eval @ [facility]. Recommend weight loss of 10-25#. Pulm rehab initiated  10/21/16 Telephone encounter: reported severe diarrhea. Recommended holding OfevX 3 days, then once a day for 7 days, then full dose of twice a day if tolerating. 11/21/16 EGD, colonoscopy: Patchy mild  inflammation characterized by erythema was found in the duodenal bulb. There is a posterior bulbar/proximal second portion stenosis with mild inflammation at the site. 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 Unable to pass the scope beyond into the second and third portion of the duodenum. There is the appearance of possible previous duodenal ulcer disease Incomplete colon, reached rectosigmoid colon. Multiple polyps seen. 6 polyps retrieved, one not retrieved. 11/24/16 Acute office visit: 6-7 days of increased dyspnea, NP cough, subjective fevers and hypoxemia with SpO2 as low as 70s. Intolerant to Ofev due to GI issues. Prescribed course of Prednisone and Levofloxacin. 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 Discharged home on tapering dose of prednisone and end of course of Levofloxacin 12/06/16: improved since recent admission. Prednisone decreased from 40 mg daily to 20 mg daily 12/13/16: Seen by Dr Mortimer Fries as acute office visit with increased dyspnea and worsening hypoxemia. Prednisone continued @ 20 mg daily. Treated with Zpak 12/16/16 CT chest: Advanced fibrotic interstitial lung disease characterized by extensive reticulation and ground-glass attenuation involving the peribronchovascular and subpleural lungs with associated traction bronchiectasis, architectural distortion and mild honeycombing. These findings have progressed since the 08/16/2016 chest CT, particularly the ground-glass component 12/16/16 Hospitalization  HPI:  Pt well known to me as documented above. She came to the ED at the recommendation of my office nurse on this day for increasing DOE, nonspecific CP and upper abdominal pain, fever and worsening hypoxemia. I spoke with Dr Jimmye Norman in the ED and recommended repeat CT chest with results as noted, empiric antibiotics and IV systemic steroids at the time  of my evaluation, she is comfortable on 5 LPM [place]. She denies productive cough, pleuritic CP,  hemoptysis, LE edema  Past Medical History:  Diagnosis Date  . Arrhythmia    afib  . Cancer ([place])    uterine  . Fibromyalgia    Chronic fatigue  . GERD (gastroesophageal reflux disease)   . Headache(784.0)   . History of headache   . History of low back pain   . Hyperlipidemia   . Hypertension   . Hypothyroidism   . Idiopathic pulmonary fibrosis ([place])   . Lyme disease   . Pulmonary fibrosis ([place])   . PVD (peripheral vascular disease) ([place])   . Strain, cervical   . Thrombosis   . Tobacco use     Past Surgical History:  Procedure Laterality Date  . ABDOMINAL HYSTERECTOMY    . BREAST BIOPSY Left 1990  . COLONOSCOPY WITH PROPOFOL N/A 11/21/2016   Procedure: COLONOSCOPY WITH PROPOFOL;  Surgeon: Lollie Sails, MD;  Location: [facility] ENDOSCOPY;  Service: Endoscopy;  Laterality: N/A;  . ESOPHAGOGASTRODUODENOSCOPY (EGD) WITH PROPOFOL N/A 11/21/2016   Procedure: ESOPHAGOGASTRODUODENOSCOPY (EGD) WITH PROPOFOL;  Surgeon: Lollie Sails, MD;  Location: [facility] ENDOSCOPY;  Service: Endoscopy;  Laterality: N/A;  . LUMBAR SPINE SURGERY    . OVARY SURGERY    . TENDON REPAIR Right    Hand  . TONSILLECTOMY    . TUBAL LIGATION Bilateral   . ULNAR NERVE TRANSPOSITION Left   . VAGINA SURGERY     biopsy  . VULVECTOMY PARTIAL      MEDICATIONS: I have reviewed all medications and confirmed regimen as documented  Social History   Social History  . Marital status: Married    Spouse name: Shanon Brow  . Number of children: 2  . Years of education: 14   Occupational History  . N/A     Disability   Social History Main Topics  . Smoking status: Former Smoker    Packs/day: 0.50    Years: 45.00    Types: Cigarettes  . Smokeless tobacco: Never Used  . Alcohol use Yes     Comment: Consumes alcohol on occasion  . Drug use: No  . Sexual activity: Not on file   Other Topics Concern  . Not on file    Social History Narrative   Patient is married for 76 years and lives with her husband Shanon Brow)   Retired    Scientist, physiological- college   Right handed.   Caffeine- sodas three daily.    Family History  Problem Relation Age of Onset  . Uterine cancer Mother   . Leukemia Mother   . Congestive Heart Failure Mother   . COPD Mother   . Diabetes Father   . Congestive Heart Failure Father   . Heart disease Maternal Grandfather   . Heart disease Paternal Grandfather   . Arthritis Brother   . Neuropathy Brother   . Breast cancer Neg Hx     ROS: As per HPI   Vitals:   12/16/16 1030 12/16/16 1130 12/16/16 1132 12/16/16 1230  BP: 115/74 125/69  106/67  Pulse: 98 (!) 108 92   Resp:      Temp:      TempSrc:      SpO2: 91%  90%   Weight:         EXAM:  Gen: WDWN, No overt respiratory distress on [place] O2 HEENT: NCAT, sclera white, oropharynx normal Neck: Supple without LAN, thyromegaly, JVD Lungs: breath sounds mildly diminished with diffuse crackles, no wheezes Cardiovascular: RRR, no murmurs noted Abdomen: Soft, nontender, normal BS Ext: without clubbing,  cyanosis, edema Neuro: CNs grossly intact, motor and sensory intact Skin: Limited exam, no lesions noted  DATA:   BMP Latest Ref Rng & Units 12/16/2016 11/26/2016 11/25/2016  Glucose 65 - 99 mg/dL 136(H) 164(H) 113(H)  BUN 6 - 20 mg/dL 13 16 14   Creatinine 0.44 - 1.00 mg/dL 0.91 0.97 0.86  BUN/Creat Ratio 11 - 26 - - -  Sodium 135 - 145 mmol/L 135 136 136  Potassium 3.5 - 5.1 mmol/L 4.1 3.7 3.6  Chloride 101 - 111 mmol/L 101 104 103  CO2 22 - 32 mmol/L 23 23 23   Calcium 8.9 - 10.3 mg/dL 8.6(L) 7.9(L) 8.8(L)    CBC Latest Ref Rng & Units 12/16/2016 11/26/2016 11/25/2016  WBC 3.6 - 11.0 K/uL 12.3(H) 8.8 14.3(H)  Hemoglobin 12.0 - 16.0 g/dL 11.4(L) 10.9(L) 11.9(L)  Hematocrit 35.0 - 47.0 % 33.3(L) 32.5(L) 35.5  Platelets 150 - 440 K/uL 235 351 340    CXR:  Increased bilateral interstitial and alveolar infiltrates CT  chest 12/16/16: advanced pulmonary fibrotic changes with marked worsening of diffuse ground glass opacities  IMPRESSION:   1) Acute on chronic hypoxic respiratory failure 2) 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 There is no supportive evidence of CHF (exam not c/w CHF and BNP normal). She has had subjective fevers so infectious overlay is possible as well   PLAN:  1) Supplemental O2 to maintain SpO2 > 90% 2) Empiric antibiotics - given recent hospitalization, it is reasonable to use vanc and zosyn as ordered 3) PCT algorithm 4) Check respiratory viral panel (this was negative during previous hospitalization) 5) Empiric systemic steroids - SSI while on IV steroids 6) Do not intubate status is appropriate as she is demonstrating a inexorable decline and it does not seem that we are able to reverse this course 7) We discussed treatment strategies, goals of care and possible outcomes. Importantly, we discussed the decreasing likelihood that we will get her stable and well enough to seriously consider lung transplant option and we discussed the possibility of home with Hospice. These matters will be considered further depending on her response to our efforts during this hospitalization 8) At discharge, we should consider initiation of pirfenidone therapy noting that she did not tolerate Ofev and acknowledging that she has chronic gastrointestinal issues (pirfenidone commonly causes nausea)  Merton Border, MD PCCM service Mobile [phone] Pager [phone] 12/16/2016

## 2016-12-16 NOTE — Progress Notes (Signed)
Patient placed on HFNC of 45L and 40% due to mask being too uncomfortable. Patients sats are 90-91% on these settings and she states it is more comfortable. Tammy RN at beside and aware of changes, both RN and RT text paged Dr. Posey Pronto about changes made to o2. Will continue to monitor.

## 2016-12-16 NOTE — Progress Notes (Signed)
Patients WOB good but her sats on 5L [place] were anywhere from 83-90%.  Placed her on 45% venturi mask and her sats came up to 91% but the mask was too uncomfortable for her.

## 2016-12-16 NOTE — Telephone Encounter (Signed)
Pt states she has a fever of 101.4, extreme SOB, chest pain, stomach pain sats were in the 50's. Informed pt to have spouse to take her to ER now. DS informed. Nothing further needed.

## 2016-12-16 NOTE — H&P (Signed)
[facility] [place] NAME: Kelli Morris    MR#:  [id number]  DATE OF BIRTH:  [birth year]  DATE OF ADMISSION:  12/16/2016  PRIMARY CARE PHYSICIAN: Tommi Rumps, MD   REQUESTING/REFERRING PHYSICIAN: Dr. Lenise Arena.   CHIEF COMPLAINT:   Chief Complaint  Patient presents with  . Shortness of Breath    HISTORY OF PRESENT ILLNESS: Kelli Morris  is a 66 y.o. female with a known history of  Progressive pulmonary fibrosis, GERD, hyperlipidemia, essential hypertension, hypothyroidism and anxiety who is presenting with shortness of breath. Patient was hospitalized with same in December. Patient had a CT scan of the chest which showed progression of her pulmonary fibrosis. The emergency room physician discussed the case with her primary pulmonologist Dr. Gilberto Better who recommended patient placing patient on broad-spectrum antibiotics due to fevers that she was experiencing at home. Patient reports low-grade fevers at home. Has chronic cough which is unchanged also complains of pressure in her chest due to breathing troubles. She reports that her breath has gotten worst over the past few days.  PAST MEDICAL HISTORY:   Past Medical History:  Diagnosis Date  . Arrhythmia    afib  . Cancer ([place])    uterine  . Fibromyalgia    Chronic fatigue  . GERD (gastroesophageal reflux disease)   . Headache(784.0)   . History of headache   . History of low back pain   . Hyperlipidemia   . Hypertension   . Hypothyroidism   . Idiopathic pulmonary fibrosis ([place])   . Lyme disease   . Pulmonary fibrosis ([place])   . PVD (peripheral vascular disease) ([place])   . Strain, cervical   . Thrombosis   . Tobacco use     PAST SURGICAL HISTORY: Past Surgical History:  Procedure Laterality Date  . ABDOMINAL HYSTERECTOMY    . BREAST BIOPSY Left 1990  . COLONOSCOPY WITH PROPOFOL N/A 11/21/2016   Procedure: COLONOSCOPY WITH PROPOFOL;  Surgeon: Lollie Sails, MD;  Location:  [facility] ENDOSCOPY;  Service: Endoscopy;  Laterality: N/A;  . ESOPHAGOGASTRODUODENOSCOPY (EGD) WITH PROPOFOL N/A 11/21/2016   Procedure: ESOPHAGOGASTRODUODENOSCOPY (EGD) WITH PROPOFOL;  Surgeon: Lollie Sails, MD;  Location: [facility] ENDOSCOPY;  Service: Endoscopy;  Laterality: N/A;  . LUMBAR SPINE SURGERY    . OVARY SURGERY    . TENDON REPAIR Right    Hand  . TONSILLECTOMY    . TUBAL LIGATION Bilateral   . ULNAR NERVE TRANSPOSITION Left   . VAGINA SURGERY     biopsy  . VULVECTOMY PARTIAL      SOCIAL HISTORY:  Social History  Substance Use Topics  . Smoking status: Former Smoker    Packs/day: 0.50    Years: 45.00    Types: Cigarettes  . Smokeless tobacco: Never Used  . Alcohol use Yes     Comment: Consumes alcohol on occasion    FAMILY HISTORY:  Family History  Problem Relation Age of Onset  . Uterine cancer Mother   . Leukemia Mother   . Congestive Heart Failure Mother   . COPD Mother   . Diabetes Father   . Congestive Heart Failure Father   . Heart disease Maternal Grandfather   . Heart disease Paternal Grandfather   . Arthritis Brother   . Neuropathy Brother   . Breast cancer Neg Hx     DRUG ALLERGIES: No Known Allergies  REVIEW OF SYSTEMS:   CONSTITUTIONAL: Positive fever, positive fatigue positive weakness.  EYES: No blurred  or double vision.  EARS, NOSE, AND THROAT: No tinnitus or ear pain.  RESPIRATORY: No cough, positive shortness of breath, no wheezing or hemoptysis.  CARDIOVASCULAR: No chest pain, orthopnea, edema.  GASTROINTESTINAL: No nausea, vomiting, diarrhea or abdominal pain.  GENITOURINARY: No dysuria, hematuria.  ENDOCRINE: No polyuria, nocturia,  HEMATOLOGY: No anemia, easy bruising or bleeding SKIN: No rash or lesion. MUSCULOSKELETAL: No joint pain or arthritis.   NEUROLOGIC: No tingling, numbness, weakness.  PSYCHIATRY: Positive anxiety  no depression.   MEDICATIONS AT HOME:  Prior to Admission medications   Medication Sig Start Date End  Date Taking? Authorizing Provider  acetaminophen (TYLENOL) 325 MG tablet Take 650 mg by mouth every 6 (six) hours as needed.    Historical Provider, MD  albuterol (PROVENTIL HFA;VENTOLIN HFA) 108 (90 Base) MCG/ACT inhaler Inhale 1-2 puffs into the lungs every 6 (six) hours as needed for wheezing or shortness of breath.    Historical Provider, MD  ALPRAZolam Duanne Moron) 0.5 MG tablet Take 1 tablet (0.5 mg total) by mouth 2 (two) times daily as needed for anxiety. 11/18/16   Leone Haven, MD  Aspirin-Caffeine (BC FAST PAIN RELIEF ARTHRITIS) 1000-65 MG PACK Take 800 mg by mouth 4 (four) times daily.     Historical Provider, MD  atorvastatin (LIPITOR) 40 MG tablet TAKE 1 TABLET (40 MG TOTAL) BY MOUTH DAILY. 10/03/16   Wellington Hampshire, MD  azithromycin (ZITHROMAX) 250 MG tablet Please take 500 mg (2 tablets) by mouth today, then take 250 mg (1 tablet) by mouth daily 12/12/16   Leone Haven, MD  ibuprofen (ADVIL,MOTRIN) 200 MG tablet Take 200 mg by mouth every 6 (six) hours as needed.    Historical Provider, MD  levothyroxine (SYNTHROID, LEVOTHROID) 25 MCG tablet Take 1 tablet (25 mcg total) by mouth daily before breakfast. 07/08/16   Leone Haven, MD  pantoprazole (PROTONIX) 40 MG tablet Take 40 mg by mouth 2 (two) times daily.    Historical Provider, MD  predniSONE (DELTASONE) 10 MG tablet Take 1 tablet (10 mg total) by mouth daily with breakfast. 12/06/16   Wilhelmina Mcardle, MD  sertraline (ZOLOFT) 50 MG tablet TAKE ONE TABLET BY MOUTH DAILY 11/09/16   Leone Haven, MD      PHYSICAL EXAMINATION:   VITAL SIGNS: Blood pressure 106/67, pulse 92, temperature 98.8 F (37.1 C), temperature source Oral, resp. rate (!) 28, weight 183 lb (83 kg), SpO2 90 %.  GENERAL:  66 y.o.-year-old patient lying in the bed with no acute distress.  EYES: Pupils equal, round, reactive to light and accommodation. No scleral icterus. Extraocular muscles intact.  HEENT: Head atraumatic, normocephalic. Oropharynx and  nasopharynx clear.  NECK:  Supple, no jugular venous distention. No thyroid enlargement, no tenderness.  LUNGS: Has bilateral crackles throughout both lungs no sensory muscle usage no CARDIOVASCULAR: S1, S2 normal. No murmurs, rubs, or gallops.  ABDOMEN: Soft, nontender, nondistended. Bowel sounds present. No organomegaly or mass.  EXTREMITIES: No pedal edema, cyanosis, or clubbing.  NEUROLOGIC: Cranial nerves II through XII are intact. Muscle strength 5/5 in all extremities. Sensation intact. Gait not checked.  PSYCHIATRIC: The patient is alert and oriented x 3.  SKIN: No obvious rash, lesion, or ulcer.   LABORATORY PANEL:   CBC  Recent Labs Lab 12/16/16 1033  WBC 12.3*  HGB 11.4*  HCT 33.3*  PLT 235  MCV 92.5  MCH 31.6  MCHC 34.1  RDW 14.8*  LYMPHSABS 0.4*  MONOABS 0.2  EOSABS 0.0  BASOSABS  0.0   ------------------------------------------------------------------------------------------------------------------  Chemistries   Recent Labs Lab 12/16/16 1033  NA 135  K 4.1  CL 101  CO2 23  GLUCOSE 136*  BUN 13  CREATININE 0.91  CALCIUM 8.6*   ------------------------------------------------------------------------------------------------------------------ estimated creatinine clearance is 63.6 mL/min (by C-G formula based on SCr of 0.91 mg/dL). ------------------------------------------------------------------------------------------------------------------ No results for input(s): TSH, T4TOTAL, T3FREE, THYROIDAB in the last 72 hours.  Invalid input(s): FREET3   Coagulation profile No results for input(s): INR, PROTIME in the last 168 hours. ------------------------------------------------------------------------------------------------------------------- No results for input(s): DDIMER in the last 72 hours. -------------------------------------------------------------------------------------------------------------------  Cardiac Enzymes  Recent Labs Lab  12/16/16 1033  TROPONINI <0.03   ------------------------------------------------------------------------------------------------------------------ Invalid input(s): POCBNP  ---------------------------------------------------------------------------------------------------------------  Urinalysis No results found for: COLORURINE, APPEARANCEUR, LABSPEC, PHURINE, GLUCOSEU, HGBUR, BILIRUBINUR, KETONESUR, PROTEINUR, UROBILINOGEN, NITRITE, LEUKOCYTESUR   RADIOLOGY: Dg Chest 2 View  Result Date: 12/16/2016 CLINICAL DATA:  Shortness of breath. EXAM: CHEST  2 VIEW COMPARISON:  12/06/2016 . FINDINGS: Cardiomegaly. Progressive bilateral pulmonary infiltrates consistent with progressive bilateral pulmonary edema and/or pneumonia. No pleural effusion or pneumothorax. IMPRESSION: Progressive bilateral pulmonary infiltrates consistent with pulmonary edema and/or pneumonia. Persistent cardiomegaly . Electronically Signed   By: Marcello Moores  Register   On: 12/16/2016 11:07   Ct Chest W Contrast  Result Date: 12/16/2016 CLINICAL DATA:  Idiopathic pulmonary fibrosis. Persistent dyspnea, cough and fever refractory to antibiotic therapy. EXAM: CT CHEST WITH CONTRAST TECHNIQUE: Multidetector CT imaging of the chest was performed during intravenous contrast administration. CONTRAST:  51mL ISOVUE-300 IOPAMIDOL (ISOVUE-300) INJECTION 61% COMPARISON:  12/16/2016 chest radiograph. 01/26/2016 high-resolution chest CT. 08/16/2016 chest CT. FINDINGS: Cardiovascular: Borderline mild cardiomegaly appears increased since 08/16/2016. Stable lipomatous hypertrophy of the interatrial septum. No significant pericardial fluid/thickening. Left anterior descending, left circumflex and right coronary atherosclerosis. Atherosclerotic nonaneurysmal thoracic aorta. Normal caliber pulmonary arteries. No central pulmonary emboli. Mediastinum/Nodes: No discrete thyroid nodules. Unremarkable esophagus. No axillary adenopathy. Mild right  paratracheal adenopathy measuring up to 1.2 cm (series 2/ image 46), stable back to 01/26/2016. Enlarged 1.7 cm subcarinal node (series 2/image 69), not appreciably changed from 1.7 cm on 08/16/2016. Mild AP window adenopathy measuring up to 1.0 cm (series 2/ image 44), stable back to the 01/26/2016. No additional pathologically enlarged mediastinal nodes. Mild bilateral hilar adenopathy measuring up to 1.0 cm on the right (series 2/ image 66) and 1.3 cm on the left (series 2/ image 71), qualitatively stable since 08/16/2016 (comparison is limited by lack of IV contrast on the prior chest CT studies). Lungs/Pleura: No pneumothorax. No pleural effusion. Basilar right lower lobe solid 1.2 x 1.0 cm pulmonary nodule (series 3/ image 104), measuring 1.2 x 1.0 cm on 01/26/2016, stable. No acute consolidative airspace disease, lung masses or new significant pulmonary nodules. There is extensive patchy reticulation and ground-glass attenuation throughout both lungs involving the peribronchovascular and subpleural lungs, with a slight basilar predominance. There is associated traction bronchiectasis and architectural distortion throughout both lungs. There are areas of early honeycombing in the peripheral right upper lobe (series 3/ image 56) and bilateral posterior lower lobes. Compared to the 01/26/2016 and 08/16/2016 chest CT studies, these findings have progressed, particularly the ground-glass component. Upper abdomen: Unremarkable. Musculoskeletal: No aggressive appearing focal osseous lesions. Moderate thoracic spondylosis. IMPRESSION: 1. Advanced fibrotic interstitial lung disease characterized by extensive reticulation and ground-glass attenuation involving the peribronchovascular and subpleural lungs with associated traction bronchiectasis, architectural distortion and mild honeycombing. These findings have progressed since the 08/16/2016 chest CT, particularly the ground-glass component. A superimposed component  of pulmonary  edema is not excluded given the new borderline mild cardiomegaly. 2. Stable basilar right lower lobe 1.2 cm solid pulmonary nodule, for which 10 month stability has been demonstrated, probably benign. Recommend attention on follow-up chest CT in 6-12 months. 3. Stable mediastinal and bilateral hilar adenopathy, most consistent with benign reactive adenopathy. 4. Aortic atherosclerosis. Three-vessel coronary atherosclerosis. Electronically Signed   By: Ilona Sorrel M.D.   On: 12/16/2016 12:26    EKG: Orders placed or performed during the hospital encounter of 12/16/16  . ED EKG  . ED EKG    IMPRESSION AND PLAN: 66 year old female with idiopathic pulmonary fibrosis who presents with acute hypoxic respiratory failure due to worsening of underlying pulmonary fibrosis.  1. Acute hypoxic respiratory failure in the setting of progressive pulmonary fibrosis with Possible  superimposed interstitial pneumonia: I will place her on nebulizer therapy every 6 hours I will treat her with IV Solu-Medrol We will ask her pulmonologist to see Patient has an appointment to follow-up at [facility] transplant team in February   2. Hypothyroidism: Continue Synthroid  3. Depression: Continue Zoloft  4. Hyperlipidemia: Continue atorvastatin   5. misc : lovenox All the records are reviewed and case discussed with ED provider. Management plans discussed with the patient, family and they are in agreement.  CODE STATUS: Code Status History    Date Active Date Inactive Code Status Order ID Comments User Context   11/25/2016  5:12 PM 11/27/2016  1:23 PM Partial Code [id number]  Bettey Costa, MD Inpatient    Questions for Most Recent Historical Code Status (Order [id number])    Question Answer Comment   In the event of cardiac or respiratory ARREST: Initiate Code Blue, Call Rapid Response Yes    In the event of cardiac or respiratory ARREST: Perform CPR Yes    In the event of cardiac or respiratory  ARREST: Perform Intubation/Mechanical Ventilation No    In the event of cardiac or respiratory ARREST: Use NIPPV/BiPAp only if indicated Yes    In the event of cardiac or respiratory ARREST: Administer ACLS medications if indicated Yes    In the event of cardiac or respiratory ARREST: Perform Defibrillation or Cardioversion if indicated Yes        TOTAL TIME TAKING CARE OF THIS PATIENT: 55 minutes.    Dustin Flock M.D on 12/16/2016 at 1:24 PM  Between 7am to 6pm - Pager - [phone]  After 6pm go to [email or web address] - password [email or web address] [facility]  [facility]  Office  [phone]  CC: Primary care physician; Tommi Rumps, MD

## 2016-12-16 NOTE — ED Triage Notes (Signed)
Pt to ed with c/o sob, cough, fever.  Pt states her o2 sats were low at home,  Hx of pulmonary fibrosis.  Pt sats at triage are 94% on 5 lpm [place].  Pt skin warm and dry.  Pt states last dose was tylenol 1000mg  at 9 am.

## 2016-12-16 NOTE — ED Notes (Signed)
Flu swab obtained and sent to lab

## 2016-12-17 ENCOUNTER — Inpatient Hospital Stay: Payer: Medicare Other

## 2016-12-17 ENCOUNTER — Other Ambulatory Visit: Payer: Self-pay | Admitting: Family Medicine

## 2016-12-17 LAB — BASIC METABOLIC PANEL
ANION GAP: 10 (ref 5–15)
BUN: 19 mg/dL (ref 6–20)
CALCIUM: 8.3 mg/dL — AB (ref 8.9–10.3)
CO2: 23 mmol/L (ref 22–32)
Chloride: 99 mmol/L — ABNORMAL LOW (ref 101–111)
Creatinine, Ser: 0.86 mg/dL (ref 0.44–1.00)
GFR calc Af Amer: >60 mL/min (ref >60)
GLUCOSE: 152 mg/dL — AB (ref 65–99)
Potassium: 3.8 mmol/L (ref 3.5–5.1)
SODIUM: 132 mmol/L — AB (ref 135–145)

## 2016-12-17 LAB — HEPATIC FUNCTION PANEL
ALBUMIN: 2.8 g/dL — AB (ref 3.5–5.0)
ALT: 15 U/L (ref 14–54)
AST: 17 U/L (ref 15–41)
Alkaline Phosphatase: 83 U/L (ref 38–126)
BILIRUBIN DIRECT: 0.1 mg/dL (ref 0.1–0.5)
BILIRUBIN TOTAL: 0.4 mg/dL (ref 0.3–1.2)
Indirect Bilirubin: 0.3 mg/dL (ref 0.3–0.9)
Total Protein: 6.8 g/dL (ref 6.5–8.1)

## 2016-12-17 LAB — COMPREHENSIVE METABOLIC PANEL
ALBUMIN: 3 g/dL — AB (ref 3.5–5.0)
ALT: 15 U/L (ref 14–54)
ANION GAP: 13 (ref 5–15)
AST: 25 U/L (ref 15–41)
Alkaline Phosphatase: 84 U/L (ref 38–126)
BUN: 24 mg/dL — ABNORMAL HIGH (ref 6–20)
CHLORIDE: 96 mmol/L — AB (ref 101–111)
CO2: 21 mmol/L — AB (ref 22–32)
Calcium: 8.6 mg/dL — ABNORMAL LOW (ref 8.9–10.3)
Creatinine, Ser: 1.09 mg/dL — ABNORMAL HIGH (ref 0.44–1.00)
GFR calc non Af Amer: 52 mL/min — ABNORMAL LOW (ref >60)
Glucose, Bld: 152 mg/dL — ABNORMAL HIGH (ref 65–99)
Potassium: 3.7 mmol/L (ref 3.5–5.1)
SODIUM: 130 mmol/L — AB (ref 135–145)
Total Bilirubin: 0.5 mg/dL (ref 0.3–1.2)
Total Protein: 6.8 g/dL (ref 6.5–8.1)

## 2016-12-17 LAB — CBC
HCT: 32.1 % — ABNORMAL LOW (ref 35.0–47.0)
Hemoglobin: 11.1 g/dL — ABNORMAL LOW (ref 12.0–16.0)
MCH: 32.2 pg (ref 26.0–34.0)
MCHC: 34.7 g/dL (ref 32.0–36.0)
MCV: 92.7 fL (ref 80.0–100.0)
PLATELETS: 229 10*3/uL (ref 150–440)
RBC: 3.46 MIL/uL — AB (ref 3.80–5.20)
RDW: 15 % — AB (ref 11.5–14.5)
WBC: 7.6 10*3/uL (ref 3.6–11.0)

## 2016-12-17 LAB — GLUCOSE, CAPILLARY
GLUCOSE-CAPILLARY: 142 mg/dL — AB (ref 65–99)
GLUCOSE-CAPILLARY: 151 mg/dL — AB (ref 65–99)
GLUCOSE-CAPILLARY: 132 mg/dL — AB (ref 65–99)
Glucose-Capillary: 117 mg/dL — ABNORMAL HIGH (ref 65–99)

## 2016-12-17 MED ORDER — METHYLPREDNISOLONE SODIUM SUCC 125 MG IJ SOLR
80.0000 mg | Freq: Two times a day (BID) | INTRAMUSCULAR | Status: DC
  Administered 2016-12-17 – 2016-12-20 (×6): 80 mg via INTRAVENOUS
  Filled 2016-12-17 (×6): qty 2

## 2016-12-17 NOTE — Progress Notes (Signed)
  Primary care physician; Tommi Rumps, MD Family Meeting Note  Advance Directive:yes  Today a meeting took place with the Patient, husband and daughter at bedside. Patient is able to participate in the discussion with M.D.   The following were discussed:Patient's diagnosis: Acute on chronic hypoxic respiratory failure secondary to advanced interstitial pulmonary fibrosis which is progressively worsening with poor prognosis , goals of treatment discussed. Plan is to continue IV antibiotics, steroids and high flow oxygen. Patient is on transplant list at [facility] .patient wants to be partial code okay with resuscitation but refusing intubation and mechanical ventilator. Husband and daughter are healthcare power of attorney if she cannot make decisions Additional follow-up to be provided: Hospitalist, pulmonology  Time spent during discussion:20 minutes  Kelli Morris, Illene Silver, MD

## 2016-12-17 NOTE — Progress Notes (Signed)
Feels somewhat better. C/O HA. No new respiratory complaints  Vitals:   12/17/16 0339 12/17/16 0800 12/17/16 0826 12/17/16 1104  BP: 105/62 114/62  (!) 111/58  Pulse: 79 79  91  Resp: 16 16  18   Temp: 97.9 F (36.6 C) 97.9 F (36.6 C)  97.8 F (36.6 C)  TempSrc: Oral Oral  Oral  SpO2: 91% 93% 91% 91%  Weight: 180 lb 9.6 oz (81.9 kg)     Height:       NAD HEENT WNL No JVD Diffuse crackles Reg, no M NABS, soft No C/C/E  BMP Latest Ref Rng & Units 12/17/2016 12/16/2016 11/26/2016  Glucose 65 - 99 mg/dL 152(H) 136(H) 164(H)  BUN 6 - 20 mg/dL 19 13 16   Creatinine 0.44 - 1.00 mg/dL 0.86 0.91 0.97  BUN/Creat Ratio 11 - 26 - - -  Sodium 135 - 145 mmol/L 132(L) 135 136  Potassium 3.5 - 5.1 mmol/L 3.8 4.1 3.7  Chloride 101 - 111 mmol/L 99(L) 101 104  CO2 22 - 32 mmol/L 23 23 23   Calcium 8.9 - 10.3 mg/dL 8.3(L) 8.6(L) 7.9(L)     CBC Latest Ref Rng & Units 12/17/2016 12/16/2016 11/26/2016  WBC 3.6 - 11.0 K/uL 7.6 12.3(H) 8.8  Hemoglobin 12.0 - 16.0 g/dL 11.1(L) 11.4(L) 10.9(L)  Hematocrit 35.0 - 47.0 % 32.1(L) 33.3(L) 32.5(L)  Platelets 150 - 440 K/uL 229 235 351   CXR: possible minimal improvement in bilateral interstitial infitrates  IMPRESSION: 1) Acute on chronic hypoxic respiratory failure 2) advanced IPF with rapidly progressive course over past several months 3) Likely acute exacerbation of IPF  PLAN/REC: 1) Continue supplemental O2 to maintain SpO2 > 90%. She will probably need to go home on a higher flow rate 2) DC Vancomycin. Cont pip-tazo for now 3) Cont systemic steroids - decreased to 80 mg IV q 12 hrs 4) I have requested LFT panel in anticipation of initiating Pirfenidone therapy at discharge. She has been intolerant to Ofev due to diarrhea   Merton Border, MD PCCM service Mobile [phone] Pager [phone] 12/17/2016

## 2016-12-17 NOTE — Progress Notes (Signed)
[place] [facility] [place] NAME: Kelli Morris    MR#:  [id number]  DATE OF BIRTH:  Dec 18, 1950  SUBJECTIVE:  CHIEF COMPLAINT:  Patient'S shortness of breath is better, on high flow oxygen. Husband and daughter are at bedside  REVIEW OF SYSTEMS:  CONSTITUTIONAL: No fever, fatigue or weakness.  EYES: No blurred or double vision.  EARS, NOSE, AND THROAT: No tinnitus or ear pain.  RESPIRATORY: Some cough, shortness of breath improved, denies wheezing or hemoptysis.  CARDIOVASCULAR: No chest pain, orthopnea, edema.  GASTROINTESTINAL: No nausea, vomiting, diarrhea or abdominal pain.  GENITOURINARY: No dysuria, hematuria.  ENDOCRINE: No polyuria, nocturia,  HEMATOLOGY: No anemia, easy bruising or bleeding SKIN: No rash or lesion. MUSCULOSKELETAL: No joint pain or arthritis.   NEUROLOGIC: No tingling, numbness, weakness.  PSYCHIATRY: No anxiety or depression.   DRUG ALLERGIES:  No Known Allergies  VITALS:  Blood pressure (!) 111/58, pulse 91, temperature 97.8 F (36.6 C), temperature source Oral, resp. rate 18, height 5\' 3"  (1.6 m), weight 81.9 kg (180 lb 9.6 oz), SpO2 91 %.  PHYSICAL EXAMINATION:  GENERAL:  66 y.o.-year-old patient lying in the bed with no acute distress.  EYES: Pupils equal, round, reactive to light and accommodation. No scleral icterus. Extraocular muscles intact.  HEENT: Head atraumatic, normocephalic. Oropharynx and nasopharynx clear.  NECK:  Supple, no jugular venous distention. No thyroid enlargement, no tenderness.  LUNGS: Diminished breath sounds bilaterally, no wheezing, rales,rhonchi . Bilateral crackles are present No use of accessory muscles of respiration.  CARDIOVASCULAR: S1, S2 normal. No murmurs, rubs, or gallops.  ABDOMEN: Soft, nontender, nondistended. Bowel sounds present. No organomegaly or mass.  EXTREMITIES: No pedal edema, cyanosis, or clubbing.  NEUROLOGIC: Cranial nerves II through XII are intact.  Muscle strength 5/5 in all extremities. Sensation intact. Gait not checked.  PSYCHIATRIC: The patient is alert and oriented x 3.  SKIN: No obvious rash, lesion, or ulcer.    LABORATORY PANEL:   CBC  Recent Labs Lab 12/17/16 0521  WBC 7.6  HGB 11.1*  HCT 32.1*  PLT 229   ------------------------------------------------------------------------------------------------------------------  Chemistries   Recent Labs Lab 12/17/16 0521  NA 132*  K 3.8  CL 99*  CO2 23  GLUCOSE 152*  BUN 19  CREATININE 0.86  CALCIUM 8.3*  AST 17  ALT 15  ALKPHOS 83  BILITOT 0.4   ------------------------------------------------------------------------------------------------------------------  Cardiac Enzymes  Recent Labs Lab 12/16/16 1033  TROPONINI <0.03   ------------------------------------------------------------------------------------------------------------------  RADIOLOGY:  Dg Chest 2 View  Result Date: 12/16/2016 CLINICAL DATA:  Shortness of breath. EXAM: CHEST  2 VIEW COMPARISON:  12/06/2016 . FINDINGS: Cardiomegaly. Progressive bilateral pulmonary infiltrates consistent with progressive bilateral pulmonary edema and/or pneumonia. No pleural effusion or pneumothorax. IMPRESSION: Progressive bilateral pulmonary infiltrates consistent with pulmonary edema and/or pneumonia. Persistent cardiomegaly . Electronically Signed   By: Marcello Moores  Register   On: 12/16/2016 11:07   Ct Chest W Contrast  Result Date: 12/16/2016 CLINICAL DATA:  Idiopathic pulmonary fibrosis. Persistent dyspnea, cough and fever refractory to antibiotic therapy. EXAM: CT CHEST WITH CONTRAST TECHNIQUE: Multidetector CT imaging of the chest was performed during intravenous contrast administration. CONTRAST:  57mL ISOVUE-300 IOPAMIDOL (ISOVUE-300) INJECTION 61% COMPARISON:  12/16/2016 chest radiograph. 01/26/2016 high-resolution chest CT. 08/16/2016 chest CT. FINDINGS: Cardiovascular: Borderline mild cardiomegaly  appears increased since 08/16/2016. Stable lipomatous hypertrophy of the interatrial septum. No significant pericardial fluid/thickening. Left anterior descending, left circumflex and right coronary atherosclerosis. Atherosclerotic nonaneurysmal thoracic aorta. Normal caliber pulmonary arteries.  No central pulmonary emboli. Mediastinum/Nodes: No discrete thyroid nodules. Unremarkable esophagus. No axillary adenopathy. Mild right paratracheal adenopathy measuring up to 1.2 cm (series 2/ image 46), stable back to 01/26/2016. Enlarged 1.7 cm subcarinal node (series 2/image 69), not appreciably changed from 1.7 cm on 08/16/2016. Mild AP window adenopathy measuring up to 1.0 cm (series 2/ image 44), stable back to the 01/26/2016. No additional pathologically enlarged mediastinal nodes. Mild bilateral hilar adenopathy measuring up to 1.0 cm on the right (series 2/ image 66) and 1.3 cm on the left (series 2/ image 71), qualitatively stable since 08/16/2016 (comparison is limited by lack of IV contrast on the prior chest CT studies). Lungs/Pleura: No pneumothorax. No pleural effusion. Basilar right lower lobe solid 1.2 x 1.0 cm pulmonary nodule (series 3/ image 104), measuring 1.2 x 1.0 cm on 01/26/2016, stable. No acute consolidative airspace disease, lung masses or new significant pulmonary nodules. There is extensive patchy reticulation and ground-glass attenuation throughout both lungs involving the peribronchovascular and subpleural lungs, with a slight basilar predominance. There is associated traction bronchiectasis and architectural distortion throughout both lungs. There are areas of early honeycombing in the peripheral right upper lobe (series 3/ image 56) and bilateral posterior lower lobes. Compared to the 01/26/2016 and 08/16/2016 chest CT studies, these findings have progressed, particularly the ground-glass component. Upper abdomen: Unremarkable. Musculoskeletal: No aggressive appearing focal osseous lesions.  Moderate thoracic spondylosis. IMPRESSION: 1. Advanced fibrotic interstitial lung disease characterized by extensive reticulation and ground-glass attenuation involving the peribronchovascular and subpleural lungs with associated traction bronchiectasis, architectural distortion and mild honeycombing. These findings have progressed since the 08/16/2016 chest CT, particularly the ground-glass component. A superimposed component of pulmonary edema is not excluded given the new borderline mild cardiomegaly. 2. Stable basilar right lower lobe 1.2 cm solid pulmonary nodule, for which 10 month stability has been demonstrated, probably benign. Recommend attention on follow-up chest CT in 6-12 months. 3. Stable mediastinal and bilateral hilar adenopathy, most consistent with benign reactive adenopathy. 4. Aortic atherosclerosis. Three-vessel coronary atherosclerosis. Electronically Signed   By: Ilona Sorrel M.D.   On: 12/16/2016 12:26   Dg Chest Port 1 View  Result Date: 12/17/2016 CLINICAL DATA:  Acute respiratory failure. EXAM: PORTABLE CHEST 1 VIEW COMPARISON:  12/16/2016. FINDINGS: Stable enlarged cardiac silhouette. Mild decrease in prominence of the interstitial markings with resolved bilateral airspace opacity. No pleural fluid. Diffuse osteopenia. Mild thoracic spine degenerative changes. IMPRESSION: 1. Improved pulmonary edema with residual underlying chronic interstitial lung disease. 2. Stable cardiomegaly. Electronically Signed   By: Claudie Revering M.D.   On: 12/17/2016 07:52    EKG:   Orders placed or performed in visit on 12/16/16  . EKG 12-Lead  . 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  1. Acute hypoxic respiratory failure in the setting of progressive pulmonary fibrosis with Possible  superimposed interstitial pneumonia: Clinically  better than yesterday I will continue her on nebulizer therapy every 6 hours I will continue  with IV Solu-Medrol Continue empiric IV antibiotics Zosyn and discontinue vancomycin, continue high flow oxygen Appreciate pulmonology recommendations , recommending initiating Pirfenidone therapy at discharge Patient has an appointment to follow-up at [facility] transplant team in February   2. Hypothyroidism: Continue Synthroid  3. Depression: Continue Zoloft  4. Hyperlipidemia: Continue atorvastatin   5. dvt px : lovenox     All the records are reviewed  and case discussed with Care Management/Social Workerr. Management plans discussed with the patient, family and they are in agreement.  CODE STATUS: partial code,husband and daughter HCPOA  TOTAL TIME TAKING CARE OF THIS PATIENT: 36  minutes.   POSSIBLE D/C IN 2-3  DAYS, DEPENDING ON CLINICAL CONDITION.  Note: This dictation was prepared with Dragon dictation along with smaller phrase technology. Any transcriptional errors that result from this process are unintentional.   Nicholes Mango M.D on 12/17/2016 at 3:18 PM  Between 7am to 6pm - Pager - [phone] After 6pm go to [email or web address] - password [email or web address] [facility]  [place] [facility]  Office  [phone]  CC: Primary care physician; Tommi Rumps, MD

## 2016-12-18 LAB — GLUCOSE, CAPILLARY
GLUCOSE-CAPILLARY: 131 mg/dL — AB (ref 65–99)
Glucose-Capillary: 180 mg/dL — ABNORMAL HIGH (ref 65–99)
Glucose-Capillary: 149 mg/dL — ABNORMAL HIGH (ref 65–99)
Glucose-Capillary: 130 mg/dL — ABNORMAL HIGH (ref 65–99)

## 2016-12-18 LAB — PROCALCITONIN: Procalcitonin: <0.1 ng/mL

## 2016-12-18 MED ORDER — ALPRAZOLAM 0.5 MG PO TABS
0.5000 mg | ORAL_TABLET | Freq: Three times a day (TID) | ORAL | Status: DC | PRN
  Administered 2016-12-18 – 2016-12-26 (×21): 0.5 mg via ORAL
  Filled 2016-12-18 (×21): qty 1

## 2016-12-18 NOTE — Progress Notes (Signed)
[facility] at [place] NAME: Kelli Morris    MR#:  [id number]  DATE OF BIRTH:  [birth year]  SUBJECTIVE:  CHIEF COMPLAINT:  Patient'S shortness of breath is better, on high flow oxygen,Reporting dry cough Denies any chest pain or dizziness.  REVIEW OF SYSTEMS:  CONSTITUTIONAL: No fever, fatigue or weakness.  EYES: No blurred or double vision.  EARS, NOSE, AND THROAT: No tinnitus or ear pain.  RESPIRATORY: Some cough, shortness of breath improved, denies wheezing or hemoptysis.  CARDIOVASCULAR: No chest pain, orthopnea, edema.  GASTROINTESTINAL: No nausea, vomiting, diarrhea or abdominal pain.  GENITOURINARY: No dysuria, hematuria.  ENDOCRINE: No polyuria, nocturia,  HEMATOLOGY: No anemia, easy bruising or bleeding SKIN: No rash or lesion. MUSCULOSKELETAL: No joint pain or arthritis.   NEUROLOGIC: No tingling, numbness, weakness.  PSYCHIATRY: No anxiety or depression.   DRUG ALLERGIES:  No Known Allergies  VITALS:  Blood pressure 111/62, pulse (!) 105, temperature 98.2 F (36.8 C), temperature source Oral, resp. rate 20, height 5\' 3"  (1.6 m), weight 81.9 kg (180 lb 9.6 oz), SpO2 92 %.  PHYSICAL EXAMINATION:  GENERAL:  66 y.o.-year-old patient lying in the bed with no acute distress.  EYES: Pupils equal, round, reactive to light and accommodation. No scleral icterus. Extraocular muscles intact.  HEENT: Head atraumatic, normocephalic. Oropharynx and nasopharynx clear.  NECK:  Supple, no jugular venous distention. No thyroid enlargement, no tenderness.  LUNGS: Diminished breath sounds bilaterally, no wheezing, rales,rhonchi . Bilateral crackles are present No use of accessory muscles of respiration.  CARDIOVASCULAR: S1, S2 normal. No murmurs, rubs, or gallops.  ABDOMEN: Soft, nontender, nondistended. Bowel sounds present. No organomegaly or mass.  EXTREMITIES: No pedal edema, cyanosis, or clubbing.  NEUROLOGIC: Cranial nerves II  through XII are intact. Muscle strength 5/5 in all extremities. Sensation intact. Gait not checked.  PSYCHIATRIC: The patient is alert and oriented x 3.  SKIN: No obvious rash, lesion, or ulcer.    LABORATORY PANEL:   CBC  Recent Labs Lab 12/17/16 0521  WBC 7.6  HGB 11.1*  HCT 32.1*  PLT 229   ------------------------------------------------------------------------------------------------------------------  Chemistries   Recent Labs Lab 12/17/16 1545  NA 130*  K 3.7  CL 96*  CO2 21*  GLUCOSE 152*  BUN 24*  CREATININE 1.09*  CALCIUM 8.6*  AST 25  ALT 15  ALKPHOS 84  BILITOT 0.5   ------------------------------------------------------------------------------------------------------------------  Cardiac Enzymes  Recent Labs Lab 12/16/16 1033  TROPONINI <0.03   ------------------------------------------------------------------------------------------------------------------  RADIOLOGY:  Ct Chest W Contrast  Result Date: 12/16/2016 CLINICAL DATA:  Idiopathic pulmonary fibrosis. Persistent dyspnea, cough and fever refractory to antibiotic therapy. EXAM: CT CHEST WITH CONTRAST TECHNIQUE: Multidetector CT imaging of the chest was performed during intravenous contrast administration. CONTRAST:  56mL ISOVUE-300 IOPAMIDOL (ISOVUE-300) INJECTION 61% COMPARISON:  12/16/2016 chest radiograph. 01/26/2016 high-resolution chest CT. 08/16/2016 chest CT. FINDINGS: Cardiovascular: Borderline mild cardiomegaly appears increased since 08/16/2016. Stable lipomatous hypertrophy of the interatrial septum. No significant pericardial fluid/thickening. Left anterior descending, left circumflex and right coronary atherosclerosis. Atherosclerotic nonaneurysmal thoracic aorta. Normal caliber pulmonary arteries. No central pulmonary emboli. Mediastinum/Nodes: No discrete thyroid nodules. Unremarkable esophagus. No axillary adenopathy. Mild right paratracheal adenopathy measuring up to 1.2 cm  (series 2/ image 46), stable back to 01/26/2016. Enlarged 1.7 cm subcarinal node (series 2/image 69), not appreciably changed from 1.7 cm on 08/16/2016. Mild AP window adenopathy measuring up to 1.0 cm (series 2/ image 44), stable back to the 01/26/2016. No additional pathologically enlarged  mediastinal nodes. Mild bilateral hilar adenopathy measuring up to 1.0 cm on the right (series 2/ image 66) and 1.3 cm on the left (series 2/ image 71), qualitatively stable since 08/16/2016 (comparison is limited by lack of IV contrast on the prior chest CT studies). Lungs/Pleura: No pneumothorax. No pleural effusion. Basilar right lower lobe solid 1.2 x 1.0 cm pulmonary nodule (series 3/ image 104), measuring 1.2 x 1.0 cm on 01/26/2016, stable. No acute consolidative airspace disease, lung masses or new significant pulmonary nodules. There is extensive patchy reticulation and ground-glass attenuation throughout both lungs involving the peribronchovascular and subpleural lungs, with a slight basilar predominance. There is associated traction bronchiectasis and architectural distortion throughout both lungs. There are areas of early honeycombing in the peripheral right upper lobe (series 3/ image 56) and bilateral posterior lower lobes. Compared to the 01/26/2016 and 08/16/2016 chest CT studies, these findings have progressed, particularly the ground-glass component. Upper abdomen: Unremarkable. Musculoskeletal: No aggressive appearing focal osseous lesions. Moderate thoracic spondylosis. IMPRESSION: 1. Advanced fibrotic interstitial lung disease characterized by extensive reticulation and ground-glass attenuation involving the peribronchovascular and subpleural lungs with associated traction bronchiectasis, architectural distortion and mild honeycombing. These findings have progressed since the 08/16/2016 chest CT, particularly the ground-glass component. A superimposed component of pulmonary edema is not excluded given the new  borderline mild cardiomegaly. 2. Stable basilar right lower lobe 1.2 cm solid pulmonary nodule, for which 10 month stability has been demonstrated, probably benign. Recommend attention on follow-up chest CT in 6-12 months. 3. Stable mediastinal and bilateral hilar adenopathy, most consistent with benign reactive adenopathy. 4. Aortic atherosclerosis. Three-vessel coronary atherosclerosis. Electronically Signed   By: Ilona Sorrel M.D.   On: 12/16/2016 12:26   Dg Chest Port 1 View  Result Date: 12/17/2016 CLINICAL DATA:  Acute respiratory failure. EXAM: PORTABLE CHEST 1 VIEW COMPARISON:  12/16/2016. FINDINGS: Stable enlarged cardiac silhouette. Mild decrease in prominence of the interstitial markings with resolved bilateral airspace opacity. No pleural fluid. Diffuse osteopenia. Mild thoracic spine degenerative changes. IMPRESSION: 1. Improved pulmonary edema with residual underlying chronic interstitial lung disease. 2. Stable cardiomegaly. Electronically Signed   By: Claudie Revering M.D.   On: 12/17/2016 07:52    EKG:   Orders placed or performed in visit on 12/16/16  . EKG 12-Lead  . 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  1. Acute hypoxic respiratory failure in the setting of progressive pulmonary fibrosis with Possible  superimposed interstitial pneumonia: Clinically Slow improvement I will continue her on nebulizer therapy every 6 hours I will continue  with IV Solu-Medrol Continue empiric IV antibiotics Zosyn and discontinue vancomycin, continue high flow oxygen Appreciate pulmonology recommendations , recommending initiating Pirfenidone therapy at discharge Patient has an appointment to follow-up at [facility] team in February   2. Hypothyroidism: Continue Synthroid  3. Depression: Continue Zoloft  4. Hyperlipidemia:  Continue atorvastatin   5. dvt px : lovenox  Generalized weakness PT consult   All the records are reviewed and case discussed with Care Management/Social Workerr. Management plans discussed with the patient, family and they are in agreement.  CODE STATUS: partial code,husband and daughter HCPOA  TOTAL TIME TAKING CARE OF THIS PATIENT: 36  minutes.   POSSIBLE D/C IN 2  DAYS, DEPENDING ON CLINICAL CONDITION.  Note: This dictation was prepared with Dragon dictation along with smaller phrase technology. Any transcriptional errors  that result from this process are unintentional.   Nicholes Mango M.D on 12/18/2016 at 11:25 AM  Between 7am to 6pm - Pager - [phone] After 6pm go to [email or web address] - password [email or web address] [facility]  [facility]  Office  [phone]  CC: Primary care physician; Tommi Rumps, MD

## 2016-12-18 NOTE — Progress Notes (Signed)
Patients sat on 40% and 40Lpm is 91% but pt states she is more SOB. Increased flow back to 45L. Patient stated that she felt better with the flow increased.  Fio2 was left at 40%. Will continue to monitor, RN notified of new setting 45l and 40%.

## 2016-12-18 NOTE — Progress Notes (Signed)
Decreased FIO2 to 40% and flow to 40 LPM. Sat is 94%, will continue to monitor.

## 2016-12-18 NOTE — Progress Notes (Signed)
Look and feels better. No new complaints  Vitals:   12/18/16 0202 12/18/16 0531 12/18/16 0800 12/18/16 0856  BP:  (!) 114/57 111/62   Pulse:  84 (!) 105   Resp:  18 20   Temp:  97.4 F (36.3 C) 98.2 F (36.8 C)   TempSrc:  Oral Oral   SpO2: 90% 94% 93% 92%  Weight:      Height:       NAD HEENT WNL No JVD bilateral crackles, minimal wheezes Reg, no M NABS, soft No C/C/E  BMP Latest Ref Rng & Units 12/17/2016 12/17/2016 12/16/2016  Glucose 65 - 99 mg/dL 152(H) 152(H) 136(H)  BUN 6 - 20 mg/dL 24(H) 19 13  Creatinine 0.44 - 1.00 mg/dL 1.09(H) 0.86 0.91  BUN/Creat Ratio 11 - 26 - - -  Sodium 135 - 145 mmol/L 130(L) 132(L) 135  Potassium 3.5 - 5.1 mmol/L 3.7 3.8 4.1  Chloride 101 - 111 mmol/L 96(L) 99(L) 101  CO2 22 - 32 mmol/L 21(L) 23 23  Calcium 8.9 - 10.3 mg/dL 8.6(L) 8.3(L) 8.6(L)     CBC Latest Ref Rng & Units 12/17/2016 12/16/2016 11/26/2016  WBC 3.6 - 11.0 K/uL 7.6 12.3(H) 8.8  Hemoglobin 12.0 - 16.0 g/dL 11.1(L) 11.4(L) 10.9(L)  Hematocrit 35.0 - 47.0 % 32.1(L) 33.3(L) 32.5(L)  Platelets 150 - 440 K/uL 229 235 351   CXR: NNF  IMPRESSION: 1) Acute on chronic hypoxic respiratory failure 2) advanced IPF with rapidly progressive course over past several months 3) Likely acute exacerbation of IPF 4) Former smoker - did not previously   PLAN/REC: 1) Continue supplemental O2 to maintain SpO2 > 90%. She will probably need to go home on a higher flow rate than previously. She will need new O2 equipment prior to discharge to accommodate her increased O2 requirements 2) Cont pip-tazo through today. Would transition to PO levofloxacin for 4 more days beginning 01/15 3) Cont systemic steroids - will probably be able to reduce dose further beginning 01/15 4) I will be leaving town for a week beginning 01/15. Dr Ashby Dawes will see her next week.  5) At discharge, she should go home on prednisone 40 mg daily until she sees me in follow up and initiation of pirfenidone therapy  (which will probably require some approval process). She seems to benefit symptomatically from nebulized bronchodilators. Therefore, would also discharge home on Breo or Anoro inhaler. She already has a PRN albuterol MDI previously prescribed. She already has follow up scheduled with me on 01/26   Merton Border, MD PCCM service Mobile [phone] Pager [phone] 12/18/2016

## 2016-12-19 ENCOUNTER — Telehealth: Payer: Self-pay | Admitting: *Deleted

## 2016-12-19 ENCOUNTER — Encounter: Payer: Self-pay | Admitting: Respiratory Therapy

## 2016-12-19 DIAGNOSIS — J8410 Pulmonary fibrosis, unspecified: Secondary | ICD-10-CM

## 2016-12-19 DIAGNOSIS — J9601 Acute respiratory failure with hypoxia: Secondary | ICD-10-CM

## 2016-12-19 LAB — CBC WITH DIFFERENTIAL/PLATELET
BASOS PCT: 0 %
Basophils Absolute: 0 10*3/uL (ref 0–0.1)
EOS PCT: 0 %
Eosinophils Absolute: 0 10*3/uL (ref 0–0.7)
HEMATOCRIT: 31.9 % — AB (ref 35.0–47.0)
Hemoglobin: 11 g/dL — ABNORMAL LOW (ref 12.0–16.0)
Lymphocytes Relative: 9 %
Lymphs Abs: 1 10*3/uL (ref 1.0–3.6)
MCH: 32.6 pg (ref 26.0–34.0)
MCHC: 34.5 g/dL (ref 32.0–36.0)
MCV: 94.6 fL (ref 80.0–100.0)
MONO ABS: 0.4 10*3/uL (ref 0.2–0.9)
MONOS PCT: 4 %
NEUTROS ABS: 9.6 10*3/uL — AB (ref 1.4–6.5)
Neutrophils Relative %: 87 %
PLATELETS: 266 10*3/uL (ref 150–440)
RBC: 3.38 MIL/uL — ABNORMAL LOW (ref 3.80–5.20)
RDW: 15 % — AB (ref 11.5–14.5)
WBC: 10.9 10*3/uL (ref 3.6–11.0)

## 2016-12-19 LAB — BASIC METABOLIC PANEL
Anion gap: 11 (ref 5–15)
BUN: 17 mg/dL (ref 6–20)
CALCIUM: 8.8 mg/dL — AB (ref 8.9–10.3)
CO2: 25 mmol/L (ref 22–32)
CREATININE: 0.76 mg/dL (ref 0.44–1.00)
Chloride: 98 mmol/L — ABNORMAL LOW (ref 101–111)
GLUCOSE: 113 mg/dL — AB (ref 65–99)
Potassium: 4 mmol/L (ref 3.5–5.1)
Sodium: 134 mmol/L — ABNORMAL LOW (ref 135–145)

## 2016-12-19 LAB — CULTURE, RESPIRATORY W GRAM STAIN

## 2016-12-19 LAB — GLUCOSE, CAPILLARY
Glucose-Capillary: 126 mg/dL — ABNORMAL HIGH (ref 65–99)
Glucose-Capillary: 154 mg/dL — ABNORMAL HIGH (ref 65–99)
Glucose-Capillary: 111 mg/dL — ABNORMAL HIGH (ref 65–99)
Glucose-Capillary: 115 mg/dL — ABNORMAL HIGH (ref 65–99)

## 2016-12-19 LAB — CULTURE, RESPIRATORY: CULTURE: NORMAL

## 2016-12-19 NOTE — Progress Notes (Signed)
[facility] [place] NAME: Kelli Morris    MR#:  [id number]  DATE OF BIRTH:  1951/12/05  SUBJECTIVE:  CHIEF COMPLAINT:  Patient'S shortness of breath is better, on high flow oxygen,Reporting dry cough   REVIEW OF SYSTEMS:  CONSTITUTIONAL: No fever, fatigue or weakness.  EYES: No blurred or double vision.  EARS, NOSE, AND THROAT: No tinnitus or ear pain.  RESPIRATORY: Some cough, shortness of breath improved, denies wheezing or hemoptysis.  CARDIOVASCULAR: No chest pain, orthopnea, edema.  GASTROINTESTINAL: No nausea, vomiting, diarrhea or abdominal pain.  GENITOURINARY: No dysuria, hematuria.  ENDOCRINE: No polyuria, nocturia,  HEMATOLOGY: No anemia, easy bruising or bleeding SKIN: No rash or lesion. MUSCULOSKELETAL: No joint pain or arthritis.   NEUROLOGIC: No tingling, numbness, weakness.  PSYCHIATRY: No anxiety or depression.   DRUG ALLERGIES:  No Known Allergies  VITALS:  Blood pressure 115/69, pulse 79, temperature 97.8 F (36.6 C), temperature source Oral, resp. rate 18, height 5\' 3"  (1.6 m), weight 81.9 kg (180 lb 9.6 oz), SpO2 90 %.  PHYSICAL EXAMINATION:  GENERAL:  66 y.o.-year-old patient lying in the bed with no acute distress.  EYES: Pupils equal, round, reactive to light and accommodation. No scleral icterus. Extraocular muscles intact.  HEENT: Head atraumatic, normocephalic. Oropharynx and nasopharynx clear.  NECK:  Supple, no jugular venous distention. No thyroid enlargement, no tenderness.  LUNGS: Diminished breath sounds bilaterally, no wheezing, rales,rhonchi . Bilateral crackles are present No use of accessory muscles of respiration.  CARDIOVASCULAR: S1, S2 normal. No murmurs, rubs, or gallops.  ABDOMEN: Soft, nontender, nondistended. Bowel sounds present. No organomegaly or mass.  EXTREMITIES: No pedal edema, cyanosis, or clubbing.  NEUROLOGIC: Cranial nerves II through XII are intact. Muscle strength 5/5  in all extremities. Sensation intact. Gait not checked.  PSYCHIATRIC: The patient is alert and oriented x 3.  SKIN: No obvious rash, lesion, or ulcer.    LABORATORY PANEL:   CBC  Recent Labs Lab 12/19/16 0640  WBC 10.9  HGB 11.0*  HCT 31.9*  PLT 266   ------------------------------------------------------------------------------------------------------------------  Chemistries   Recent Labs Lab 12/17/16 1545 12/19/16 0640  NA 130* 134*  K 3.7 4.0  CL 96* 98*  CO2 21* 25  GLUCOSE 152* 113*  BUN 24* 17  CREATININE 1.09* 0.76  CALCIUM 8.6* 8.8*  AST 25  --   ALT 15  --   ALKPHOS 84  --   BILITOT 0.5  --    ------------------------------------------------------------------------------------------------------------------  Cardiac Enzymes  Recent Labs Lab 12/16/16 1033  TROPONINI <0.03   ------------------------------------------------------------------------------------------------------------------  RADIOLOGY:  No results found.  EKG:   Orders placed or performed in visit on 12/16/16  . EKG 12-Lead  . EKG 12-Lead  . 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  1. Acute hypoxic respiratory failure in the setting of progressive pulmonary fibrosis with Possible  superimposed interstitial pneumonia: Clinically Slow improvement I will continue her on nebulizer therapy every 6 hours I will continue  with IV Solu-Medrol, at d/c prednisone 40 mg I=until seen by dr.Simonds before Piferidone approval Continue empiric IV antibiotics Zosyn and discontinue vancomycin, continue high flow oxygen Appreciate pulmonology recommendations  Patient has an appointment to follow-up at [facility] transplant team in February   2. Hypothyroidism: Continue Synthroid  3. Depression: Continue Zoloft  4. Hyperlipidemia:  Continue atorvastatin  5. dvt px : lovenox  Generalized weakness -PT- HHPT recommended F/u with CM for high flow o2 to go home   All the records are reviewed and case discussed with Care Management/Social Workerr. Management plans discussed with the patient, family and they are in agreement.  CODE STATUS: partial code,husband and daughter HCPOA  TOTAL TIME TAKING CARE OF THIS PATIENT: 36  minutes.   POSSIBLE D/C IN 2  DAYS, DEPENDING ON CLINICAL CONDITION.  Note: This dictation was prepared with Dragon dictation along with smaller phrase technology. Any transcriptional errors that result from this process are unintentional.   Nicholes Mango M.D on 12/19/2016 at 6:22 PM  Between 7am to 6pm - Pager - [phone] After 6pm go to [email or web address] - password [email or web address] [facility]  [facility]  Office  [phone]  CC: Primary care physician; Tommi Rumps, MD

## 2016-12-19 NOTE — Progress Notes (Signed)
RT entered room and pt stated she has been having a hard time breathing tonight. RT checked pulse ox, SpO2 83% on HFNC 45L 40%. RT increased settings to 50L 50%, SpO2 now 92%, pt states she feels more comfortable now.

## 2016-12-19 NOTE — Care Management (Signed)
Patient is followed by [facility] pulmonology.  She has advanced pulmonary fibrosis and has appointment with  a transplant team at in February.  She has home oxygen through [facility] and there is concern she is gong to have to have an increased liter flow at discharge. Pirfenidone therapy is to be initiated.  CM will investigate approval process and contact [facility] about home accommodations for increased oxygen demands.  [facility] heads up referral

## 2016-12-19 NOTE — Telephone Encounter (Signed)
Pt is currently admitted in [facility] , she will be released this week.   Pt also requested to have her xanax refilled, and increased to 3 times a day.  Pt contact [place]

## 2016-12-19 NOTE — Progress Notes (Signed)
Look and feels better. No new complaints  Vitals:   12/19/16 0415 12/19/16 0827 12/19/16 0846 12/19/16 1240  BP: 114/70 125/70  115/69  Pulse: 79   79  Resp: 16 (!) 22  18  Temp: 98 F (36.7 C) 98.1 F (36.7 C)  97.8 F (36.6 C)  TempSrc: Oral Oral  Oral  SpO2: 94% 93% 92% 97%  Weight:      Height:       NAD HEENT WNL No JVD bilateral crackles Reg, no M NABS, soft No C/C/E  BMP Latest Ref Rng & Units 12/19/2016 12/17/2016 12/17/2016  Glucose 65 - 99 mg/dL 113(H) 152(H) 152(H)  BUN 6 - 20 mg/dL 17 24(H) 19  Creatinine 0.44 - 1.00 mg/dL 0.76 1.09(H) 0.86  BUN/Creat Ratio 11 - 26 - - -  Sodium 135 - 145 mmol/L 134(L) 130(L) 132(L)  Potassium 3.5 - 5.1 mmol/L 4.0 3.7 3.8  Chloride 101 - 111 mmol/L 98(L) 96(L) 99(L)  CO2 22 - 32 mmol/L 25 21(L) 23  Calcium 8.9 - 10.3 mg/dL 8.8(L) 8.6(L) 8.3(L)     CBC Latest Ref Rng & Units 12/19/2016 12/17/2016 12/16/2016  WBC 3.6 - 11.0 K/uL 10.9 7.6 12.3(H)  Hemoglobin 12.0 - 16.0 g/dL 11.0(L) 11.1(L) 11.4(L)  Hematocrit 35.0 - 47.0 % 31.9(L) 32.1(L) 33.3(L)  Platelets 150 - 440 K/uL 266 229 235   CXR: NNF  IMPRESSION: 1) Acute on chronic hypoxic respiratory failure 2) advanced IPF with rapidly progressive course over past several months 3) Likely acute exacerbation of IPF 4) Former smoker - did not previously   PLAN/REC: 1) Continue supplemental O2 to maintain SpO2 > 90%. Now requiring 50% hi-flow.  2) Cont pip-tazo. 3) Cont systemic steroids - unable to wean due to high oxygen requirements.   5) At discharge, she should go home on prednisone 40 mg daily until she sees me in follow up and initiation of pirfenidone therapy (which will probably require some approval process). She seems to benefit symptomatically from nebulized bronchodilators. Therefore, would also discharge home on Breo or Anoro inhaler. She already has a PRN albuterol MDI previously prescribed. She already has follow up scheduled with me on 01/26   Merton Border,  MD PCCM service Mobile [phone] Pager [phone] 12/19/2016

## 2016-12-19 NOTE — Telephone Encounter (Signed)
Refill request has been sent to you

## 2016-12-19 NOTE — Telephone Encounter (Signed)
Noted. We can discuss specific dosing after discharge.

## 2016-12-19 NOTE — Evaluation (Signed)
Physical Therapy Evaluation Patient Details Name: Kelli Morris MRN: [id number] DOB: Jan 04, 1951 Today's Date: 12/19/2016   History of Present Illness  Kelli Morris  is a 66 y.o. female with a known history of  Progressive pulmonary fibrosis, GERD, hyperlipidemia, essential hypertension, hypothyroidism and anxiety who is presenting with shortness of breath. Patient was hospitalized with same in December. Patient had a CT scan of the chest which showed progression of her pulmonary fibrosis. The emergency room physician discussed the case with her primary pulmonologist Dr. Gilberto Better who recommended patient placing patient on broad-spectrum antibiotics due to fevers that she was experiencing at home. Patient reports low-grade fevers at home. Has chronic cough which is unchanged also complains of pressure in her chest due to breathing troubles. She reports that her breath has gotten worst over the past few days.  Clinical Impression  Pt admitted with above diagnosis. Pt currently with functional limitations due to the deficits listed below (see PT Problem List).  Pt is modified independent for bed mobility and supervision only for transfers. She demonstrates general deconditioning, specifically in LEs, but retains functional strength for transfers and ambulation. Patient's primary limitation is her cardiopulmonary status. Following ambulation from bed to recliner SaO2 drops to 81-82%. With seated rest break it recovers within 30-45s to 88% and then takes an additional 30-45 seconds of pursed lip breathing to recover to 93% on HFNC. Pt visibly fatigued with increased respiration rate. She would benefit from [facility] PT to maintain LE strength/function as well as balance at discharge. She reports that she is discharging with Hospice which will have to assess her for PT following discharge. Pt will benefit from skilled PT services to address deficits in strength, balance, and mobility in order to return to full function at  home.     Follow Up Recommendations Home health PT;Other (comment) (For balance and strength)    Equipment Recommendations  None recommended by PT    Recommendations for Other Services       Precautions / Restrictions Precautions Precautions: Fall Restrictions Weight Bearing Restrictions: No Other Position/Activity Restrictions: maintain SaO2>90%      Mobility  Bed Mobility Overal bed mobility: Modified Independent             General bed mobility comments: Fair speed and sequencing with HOB elevated and bed rails utilized  Transfers Overall transfer level: Needs assistance Equipment used: None Transfers: Sit to/from Stand Sit to Stand: Supervision         General transfer comment: Pt requires slight increase in time in order to come to standing due to LE weakness. However once upright is stable in static stance. Able to perform standing marches without UE support  Ambulation/Gait Ambulation/Gait assistance: Min guard Ambulation Distance (Feet): 3 Feet Assistive device: None   Gait velocity: Decreased but functional for household mobility Gait velocity interpretation: <1.8 ft/sec, indicative of risk for recurrent falls General Gait Details: Pt able to take small steps to ambulate from bed to recliner. No UE assistance needed and pt demonstrates fair stability. Following ambulation SaO2 drops to 81-82%. With seated rest break it recovers within 30-45s to 88% and then takes an additional 30-45 seconds of pursed lip breathing to recover to 93% on HFNC.   Stairs            Wheelchair Mobility    Modified Rankin (Stroke Patients Only)       Balance Overall balance assessment: Needs assistance Sitting-balance support: No upper extremity supported Sitting balance-Leahy Scale: Good  Standing balance support: No upper extremity supported Standing balance-Leahy Scale: Fair Standing balance comment: Able to maintain wide and narrow stance balance.  Positive Rhomberg for increased sway but no LOB. Single leg balance is 3-4 seconds on each side                             Pertinent Vitals/Pain Pain Assessment: No/denies pain    Home Living Family/patient expects to be discharged to:: Private residence Living Arrangements: Spouse/significant other Available Help at Discharge: Family Type of Home: House Home Access: Stairs to enter Entrance Stairs-Rails: None Entrance Stairs-Number of Steps: 2 Home Layout: One level Home Equipment: Environmental consultant - 2 wheels;Cane - single point;Wheelchair - manual (currently renting wheelchair, no BSC, no hospital bed)      Prior Function Level of Independence: Needs assistance   Gait / Transfers Assistance Needed: Pt reports ambulation without assistive device. Limited secondary to cardiopulmonary endurance  ADL's / Homemaking Assistance Needed: Requiring assist recently from husband with ADLs as well as IADLs        Hand Dominance   Dominant Hand: Right    Extremity/Trunk Assessment   Upper Extremity Assessment Upper Extremity Assessment: Generalized weakness    Lower Extremity Assessment Lower Extremity Assessment: Generalized weakness       Communication   Communication: No difficulties  Cognition Arousal/Alertness: Awake/alert Behavior During Therapy: WFL for tasks assessed/performed Overall Cognitive Status: Within Functional Limits for tasks assessed                      General Comments      Exercises     Assessment/Plan    PT Assessment Patient needs continued PT services  PT Problem List Decreased strength;Decreased activity tolerance;Decreased balance;Cardiopulmonary status limiting activity          PT Treatment Interventions DME instruction;Gait training;Stair training;Functional mobility training;Therapeutic activities;Therapeutic exercise;Balance training;Neuromuscular re-education;Patient/family education    PT Goals (Current goals can be  found in the Care Plan section)  Acute Rehab PT Goals Patient Stated Goal: Maintain strength PT Goal Formulation: With patient/family Time For Goal Achievement: 01/02/17 Potential to Achieve Goals: Fair    Frequency Min 2X/week   Barriers to discharge        Co-evaluation               End of Session Equipment Utilized During Treatment: Gait belt;Oxygen Activity Tolerance: Treatment limited secondary to medical complications (Comment);Other (comment) (Limited due to cardiopulmonary status) Patient left: in chair;with call bell/phone within reach;with family/visitor present (mod fall risk of 8, no chair alarm) Nurse Communication: Mobility status;Other (comment) (In recliner, mod fall risk of 8)         Time: 1140-1158 PT Time Calculation (min) (ACUTE ONLY): 18 min   Charges:   PT Evaluation $PT Eval Moderate Complexity: 1 Procedure     PT G Codes:       Lyndel Safe Huprich PT, DPT   Huprich,Jason 12/19/2016, 12:24 PM

## 2016-12-19 NOTE — Progress Notes (Signed)
Pulmonary Individual Treatment Plan  Patient Details  Name: Kelli Morris MRN: [id number] Date of Birth: 1951-09-26 Referring Provider:   Flowsheet Row Pulmonary Rehab from 10/04/2016 in [facility] Cardiac and Pulmonary Rehab  Referring Provider  Normanna      Initial Encounter Date:  Flowsheet Row Pulmonary Rehab from 10/04/2016 in [facility] Cardiac and Pulmonary Rehab  Date  10/04/16  Referring Provider  Simonds      Visit Diagnosis: Pulmonary fibrosis ([place])  Patient's Home Medications on Admission: No current facility-administered medications for this visit.  No current outpatient prescriptions on file.  Facility-Administered Medications Ordered in Other Visits:    0.9 %  sodium chloride infusion, 250 mL, Intravenous, PRN, Dustin Flock, MD   acetaminophen (TYLENOL) tablet 650 mg, 650 mg, Oral, Q6H PRN, Dustin Flock, MD, 650 mg at 12/18/16 1657   ALPRAZolam (XANAX) tablet 0.5 mg, 0.5 mg, Oral, TID PRN, Nicholes Mango, MD, 0.5 mg at 12/19/16 0136   aspirin tablet 650 mg, 650 mg, Oral, QID, Dustin Flock, MD, 650 mg at 12/18/16 2119   atorvastatin (LIPITOR) tablet 40 mg, 40 mg, Oral, q1800, Dustin Flock, MD, 40 mg at 12/18/16 1843   budesonide (PULMICORT) nebulizer solution 0.5 mg, 0.5 mg, Nebulization, BID, Wilhelmina Mcardle, MD, 0.5 mg at 12/18/16 2050   enoxaparin (LOVENOX) injection 40 mg, 40 mg, Subcutaneous, Q24H, Dustin Flock, MD, 40 mg at 12/18/16 2119   HYDROcodone-acetaminophen (NORCO/VICODIN) 5-325 MG per tablet 1-2 tablet, 1-2 tablet, Oral, Q4H PRN, Dustin Flock, MD, 1 tablet at 12/17/16 0644   ibuprofen (ADVIL,MOTRIN) tablet 200 mg, 200 mg, Oral, Q6H PRN, Dustin Flock, MD, 200 mg at 12/18/16 2119   insulin aspart (novoLOG) injection 0-15 Units, 0-15 Units, Subcutaneous, TID WC, Wilhelmina Mcardle, MD, 2 Units at 12/18/16 1800   insulin aspart (novoLOG) injection 0-5 Units, 0-5 Units, Subcutaneous, QHS, Wilhelmina Mcardle, MD   ipratropium-albuterol (DUONEB) 0.5-2.5  (3) MG/3ML nebulizer solution 3 mL, 3 mL, Nebulization, Q6H, Dustin Flock, MD, 3 mL at 12/19/16 0113   levothyroxine (SYNTHROID, LEVOTHROID) tablet 25 mcg, 25 mcg, Oral, QAC breakfast, Dustin Flock, MD, 25 mcg at 12/18/16 0758   methylPREDNISolone sodium succinate (SOLU-MEDROL) 125 mg/2 mL injection 80 mg, 80 mg, Intravenous, Q12H, Wilhelmina Mcardle, MD, 80 mg at 12/19/16 0542   ondansetron (ZOFRAN) tablet 4 mg, 4 mg, Oral, Q6H PRN **OR** ondansetron (ZOFRAN) injection 4 mg, 4 mg, Intravenous, Q6H PRN, Dustin Flock, MD   pantoprazole (PROTONIX) EC tablet 40 mg, 40 mg, Oral, BID, Dustin Flock, MD, 40 mg at 12/18/16 2119   piperacillin-tazobactam (ZOSYN) IVPB 3.375 g, 3.375 g, Intravenous, Q8H, Lenis Noon, RPH, 3.375 g at 12/19/16 0542   sertraline (ZOLOFT) tablet 50 mg, 50 mg, Oral, Daily, Dustin Flock, MD, 50 mg at 12/18/16 1100   sodium chloride flush (NS) 0.9 % injection 3 mL, 3 mL, Intravenous, Q12H, Dustin Flock, MD, 3 mL at 12/18/16 2200   sodium chloride flush (NS) 0.9 % injection 3 mL, 3 mL, Intravenous, PRN, Dustin Flock, MD  Past Medical History: Past Medical History:  Diagnosis Date   Arrhythmia    afib   Cancer ([place])    uterine   Fibromyalgia    Chronic fatigue   GERD (gastroesophageal reflux disease)    Headache(784.0)    History of headache    History of low back pain    Hyperlipidemia    Hypertension    Hypothyroidism    Idiopathic pulmonary fibrosis (HCC)    Lyme disease  Pulmonary fibrosis (HCC)    PVD (peripheral vascular disease) ([place])    Strain, cervical    Thrombosis    Tobacco use     Tobacco Use: History  Smoking Status   Former Smoker   Packs/day: 0.50   Years: 45.00   Types: Cigarettes  Smokeless Tobacco   Never Used    Labs: Recent [employer] for ITP Cardiac and Pulmonary Rehab Latest Ref Rng & Units 12/15/2015 02/04/2016 02/10/2016   Cholestrol 100 - 199 mg/dL 207(H) 172 -    LDLCALC 0 - 99 mg/dL 149(H) 115(H) -   HDL >39 mg/dL 44 45 -   Trlycerides 0 - 149 mg/dL 70 62 -   Hemoglobin A1c 4.6 - 6.5 % - - 6.1       ADL UCSD:     Pulmonary Assessment Scores    Row Name 10/04/16 1213         ADL UCSD   SOB Score total 24     Rest 0     Walk 1     Stairs 3     Bath 0     Dress 1     Shop 1        Pulmonary Function Assessment:     Pulmonary Function Assessment - 10/04/16 1210      Pulmonary Function Tests   FVC% 72 %   FEV1% 79 %   FEV1/FVC Ratio 86   RV% 45 %   DLCO% 52 %     Breath   Bilateral Breath Sounds Decreased;Rales;Basilar   Shortness of Breath Yes;Limiting activity      Exercise Target Goals:    Exercise Program Goal: Individual exercise prescription set with THRR, safety & activity barriers. Participant demonstrates ability to understand and report RPE using BORG scale, to self-measure pulse accurately, and to acknowledge the importance of the exercise prescription.  Exercise Prescription Goal: Starting with aerobic activity 30 plus minutes a day, 3 days per week for initial exercise prescription. Provide home exercise prescription and guidelines that participant acknowledges understanding prior to discharge.  Activity Barriers & Risk Stratification:     Activity Barriers & Cardiac Risk Stratification - 10/04/16 1209      Activity Barriers & Cardiac Risk Stratification   Activity Barriers Back Problems;Deconditioning;Joint Problems   Cardiac Risk Stratification Moderate      6 Minute Walk:     6 Minute Walk    Row Name 10/04/16 1220         6 Minute Walk   Distance 1450 feet     Walk Time 6 minutes     # of Rest Breaks 0     MPH 2.75     METS 3.3     RPE 8     Perceived Dyspnea  2     VO2 Peak 11.55     Symptoms No     Resting HR 73 bpm     Resting BP 112/62     Max Ex. HR 106 bpm     Max Ex. BP 142/74       Interval HR   Baseline HR 73     1 Minute HR 101     2 Minute HR 105     3 Minute  HR 101     4 Minute HR 106     5 Minute HR 102     6 Minute HR 101     2 Minute Post HR  80     Interval Heart Rate? Yes       Interval Oxygen   Interval Oxygen? Yes     Baseline Oxygen Saturation % 97 %     1 Minute Oxygen Saturation % 94 %     2 Minute Oxygen Saturation % 92 %     3 Minute Oxygen Saturation % 95 %     4 Minute Oxygen Saturation % 92 %     5 Minute Oxygen Saturation % 91 %     6 Minute Oxygen Saturation % 94 %     2 Minute Post Oxygen Saturation % 97 %        Initial Exercise Prescription:     Initial Exercise Prescription - 10/04/16 1200      Date of Initial Exercise RX and Referring Provider   Date 10/04/16   Referring Provider Simonds     Treadmill   MPH 2   Grade 2.5   Minutes 15   METs 3     Elliptical   Level 1   Speed 2.5   Minutes 15   METs 3     Prescription Details   Frequency (times per week) 3   Duration Progress to 45 minutes of aerobic exercise without signs/symptoms of physical distress     Intensity   THRR 40-80% of Max Heartrate 106-139   Ratings of Perceived Exertion 11-13   Perceived Dyspnea 0-4     Progression   Progression Continue to progress workloads to maintain intensity without signs/symptoms of physical distress.     Resistance Training   Training Prescription Yes   Weight 3   Reps 10-15      Perform Capillary Blood Glucose checks as needed.  Exercise Prescription Changes:     Exercise Prescription Changes    Row Name 10/10/16 1200 10/25/16 1500           Exercise Review   Progression  -- Yes        Response to Exercise   Blood Pressure (Admit) 130/70 132/78      Blood Pressure (Exercise)  -- 152/82      Blood Pressure (Exit) 124/72 118/80      Heart Rate (Admit) 72 bpm 82 bpm      Heart Rate (Exercise) 122 bpm 155 bpm      Heart Rate (Exit) 81 bpm 80 bpm      Oxygen Saturation (Admit) 98 % 96 %      Oxygen Saturation (Exercise) 92 % 90 %      Oxygen Saturation (Exit) 96 % 97 %      Rating  of Perceived Exertion (Exercise) 13 12      Perceived Dyspnea (Exercise) 3 3      Duration  -- Progress to 45 minutes of aerobic exercise without signs/symptoms of physical distress      Intensity  -- THRR unchanged        Resistance Training   Training Prescription Yes Yes      Weight 3 3      Reps 10-15 10-15        Interval Training   Interval Training  -- No        Treadmill   MPH 2 2.5      Grade 2.5 2.5      Minutes 15 15      METs 3 3.78        Elliptical   Level 1 1  Speed 2.5 3.1      Minutes 15 15      METs 3  --         Exercise Comments:     Exercise Comments    Row Name 10/10/16 1237 10/25/16 1558         Exercise Comments First full day of exercise!  Patient was oriented to gym and equipment including functions, settings, policies, and procedures.  Patient's individual exercise prescription and treatment plan were reviewed.  All starting workloads were established based on the results of the 6 minute walk test done at initial orientation visit.  The plan for exercise progression was also introduced and progression will be customized based on patient's performance and goals Kelli Morris has done well in her first weeks of exercise.         Discharge Exercise Prescription (Final Exercise Prescription Changes):     Exercise Prescription Changes - 10/25/16 1500      Exercise Review   Progression Yes     Response to Exercise   Blood Pressure (Admit) 132/78   Blood Pressure (Exercise) 152/82   Blood Pressure (Exit) 118/80   Heart Rate (Admit) 82 bpm   Heart Rate (Exercise) 155 bpm   Heart Rate (Exit) 80 bpm   Oxygen Saturation (Admit) 96 %   Oxygen Saturation (Exercise) 90 %   Oxygen Saturation (Exit) 97 %   Rating of Perceived Exertion (Exercise) 12   Perceived Dyspnea (Exercise) 3   Duration Progress to 45 minutes of aerobic exercise without signs/symptoms of physical distress   Intensity THRR unchanged     Resistance Training   Training  Prescription Yes   Weight 3   Reps 10-15     Interval Training   Interval Training No     Treadmill   MPH 2.5   Grade 2.5   Minutes 15   METs 3.78     Elliptical   Level 1   Speed 3.1   Minutes 15       Nutrition:  Target Goals: Understanding of nutrition guidelines, daily intake of sodium <1532m, cholesterol <2052m calories 30% from fat and 7% or less from saturated fats, daily to have 5 or more servings of fruits and vegetables.  Biometrics:     Pre Biometrics - 10/04/16 1213      Pre Biometrics   Height 5' 3.5" (1.613 m)   Weight 180 lb 12.8 oz (82 kg)   Waist Circumference 38.75 inches   Hip Circumference 43.5 inches   Waist to Hip Ratio 0.89 %   BMI (Calculated) 31.6       Nutrition Therapy Plan and Nutrition Goals:   Nutrition Discharge: Rate Your Plate Scores:   Psychosocial: Target Goals: Acknowledge presence or absence of depression, maximize coping skills, provide positive support system. Participant is able to verbalize types and ability to use techniques and skills needed for reducing stress and depression.  Initial Review & Psychosocial Screening:     Initial Psych Review & Screening - 10/04/16 12[place]   Comments Kelli MoFriends excited to start LuMeadShe has been recently diagnoised with Pulmonary Fibrosis and wants to learn all she can about the disease. [facility] excellent [facility] from her husband and two children.excellent support from from her      Barriers   Psychosocial barriers to participate in program The patient should benefit  from training in stress management and relaxation.     Screening Interventions   Interventions Encouraged to exercise;Other (comment)   Comments Kelli Morris is required to meet with a psychologist by the Transplant Team.      Quality of Life Scores:     Quality of Life - 10/04/16 1233      Quality of Life Scores    Health/Function Pre 20.56 %   Socioeconomic Pre 19.63 %   Psych/Spiritual Pre 21 %   Family Pre 21 %   GLOBAL Pre 20.5 %      PHQ-9: Recent Review Flowsheet Data    Depression screen [facility] 2/9 10/04/2016 08/15/2016   Decreased Interest 0 1   Down, Depressed, Hopeless 0 1   PHQ - 2 Score 0 2   Altered sleeping 2 3   Tired, decreased energy 2 2   Change in appetite 0 1   Feeling bad or failure about yourself  0 1   Trouble concentrating 0 1   Moving slowly or fidgety/restless 0 1   Suicidal thoughts 0 0   PHQ-9 Score 4 11   Difficult doing work/chores Not difficult at all Somewhat difficult      Psychosocial Evaluation and Intervention:     Psychosocial Evaluation - 10/19/16 1150      Psychosocial Evaluation & Interventions   Comments Counselor met with Kelli. Morris today for initial psychosocial evaluation.  She is a 66 year old diagnosed with IPF about 18 months ago.  She has a spouse of 47 years; an adult son and daughter who live close by; as well as a sister and "lots of cousins."  She has some difficulty sleeping with negative side effects of a medication that results in diarrhea.  Her Dr. is changing this to hopefully a new medication to help with these negative side effects.  She reports a good appetite and no history of depression or anxiety or any current symptoms.  She stated her mood is typically positive; but her Dr. mentioned her "seeing a psychologist" to help with coping with IPF and the impact it has on her and her family.  These are also her primary stressors; as well as finances at this time.  Kelli. Morris desires to be stronger and more prepared to live with this diagnosis in every way.  She wants to increase her activity level and is committed [facility].  Staff wil be following with Kelli. M throughout the course of this program.       Psychosocial Re-Evaluation:     Psychosocial Re-Evaluation     Row Name 10/19/16 1151 10/24/16 1112           Psychosocial Re-Evaluation   Comments Follow up with Kelli Morris stating she is happy to finally be "moving and doing something" compared to her life before this class.  She is sleeping more soundly now; but still gets up and down with the medication side effects of diarrhea.  She states the holidays will be full of losses with her parents both dying over the past few years; but she chooses to focus on the good memories and building a house at the [place] to move into next Spring.  She plans to head to [place] over the Thanksgiving holidays and is looking forward to this time with family.   Counselor follow up with Kelli Morris stating she is doing much better now that the Dr. has  allowed her to temporarily stop the medication that was causing severe diarrhea.  She has been instructed to start back in a few days taking only 1/2 dose at that time.  She reports having more energy and sleeping better as a result.  She has increased her stamina since coming into this program and reports more energy recently. She continues to be in a positive mood and reports minimal stress at this time. Counselor will continue to follow with Kelli Morris.        Education: Education Goals: Education classes will be provided on a weekly basis, covering required topics. Participant will state understanding/return demonstration of topics presented.  Learning Barriers/Preferences:     Learning Barriers/Preferences - 10/04/16 1210      Learning Barriers/Preferences   Learning Barriers None   Learning Preferences None      Education Topics: Initial Evaluation Education: - Verbal, written and demonstration of respiratory meds, RPE/PD scales, oximetry and breathing techniques. Instruction on use of nebulizers and MDIs: cleaning and proper use, rinsing mouth with steroid doses and importance of monitoring MDI activations. Flowsheet Row Pulmonary Rehab from 10/24/2016 in [facility] Cardiac and Pulmonary  Rehab  Date  10/04/16  Educator  LB  Instruction Review Code  2- meets goals/outcomes      General Nutrition Guidelines/Fats and Fiber: -Group instruction provided by verbal, written material, models and posters to present the general guidelines for heart healthy nutrition. Gives an explanation and review of dietary fats and fiber. Flowsheet Row Pulmonary Rehab from 10/24/2016 in [facility] Cardiac and Pulmonary Rehab  Date  10/24/16  Educator  Cr  Instruction Review Code  2- meets goals/outcomes      Controlling Sodium/Reading Food Labels: -Group verbal and written material supporting the discussion of sodium use in heart healthy nutrition. Review and explanation with models, verbal and written materials for utilization of the food label.   Exercise Physiology & Risk Factors: - Group verbal and written instruction with models to review the exercise physiology of the cardiovascular system and associated critical values. Details cardiovascular disease risk factors and the goals associated with each risk factor. Flowsheet Row Pulmonary Rehab from 10/24/2016 in [facility] Cardiac and Pulmonary Rehab  Date  10/19/16  Educator  AS  Instruction Review Code  2- meets goals/outcomes      Aerobic Exercise & Resistance Training: - Gives group verbal and written discussion on the health impact of inactivity. On the components of aerobic and resistive training programs and the benefits of this training and how to safely progress through these programs.   Flexibility, Balance, General Exercise Guidelines: - Provides group verbal and written instruction on the benefits of flexibility and balance training programs. Provides general exercise guidelines with specific guidelines to those with heart or lung disease. Demonstration and skill practice provided.   Stress Management: - Provides group verbal and written instruction about the health risks of elevated stress, cause of high stress, and healthy ways to  reduce stress.   Depression: - Provides group verbal and written instruction on the correlation between heart/lung disease and depressed mood, treatment options, and the stigmas associated with seeking treatment.   Exercise & Equipment Safety: - Individual verbal instruction and demonstration of equipment use and safety with use of the equipment. Flowsheet Row Pulmonary Rehab from 10/24/2016 in [facility] Cardiac and Pulmonary Rehab  Date  10/10/16  Educator  AS  Instruction Review Code  2- meets goals/outcomes      Infection Prevention: - Provides verbal and written material to  individual with discussion of infection control including proper hand washing and proper equipment cleaning during exercise session. Flowsheet Row Pulmonary Rehab from 10/24/2016 in [facility] Cardiac and Pulmonary Rehab  Date  10/10/16  Educator  AS  Instruction Review Code  2- meets goals/outcomes      Falls Prevention: - Provides verbal and written material to individual with discussion of falls prevention and safety. Flowsheet Row Pulmonary Rehab from 10/24/2016 in [facility] Cardiac and Pulmonary Rehab  Date  10/04/16  Educator  LB  Instruction Review Code  2- meets goals/outcomes      Diabetes: - Individual verbal and written instruction to review signs/symptoms of diabetes, desired ranges of glucose level fasting, after meals and with exercise. Advice that pre and post exercise glucose checks will be done for 3 sessions at entry of program.   Chronic Lung Diseases: - Group verbal and written instruction to review new updates, new respiratory medications, new advancements in procedures and treatments. Provide informative websites and "800" numbers of self-education.   Lung Procedures: - Group verbal and written instruction to describe testing methods done to diagnose lung disease. Review the outcome of test results. Describe the treatment choices: Pulmonary Function Tests, ABGs and oximetry.   Energy  Conservation: - Provide group verbal and written instruction for methods to conserve energy, plan and organize activities. Instruct on pacing techniques, use of adaptive equipment and posture/positioning to relieve shortness of breath.   Triggers: - Group verbal and written instruction to review types of environmental controls: home humidity, furnaces, filters, dust mite/pet prevention, HEPA vacuums. To discuss weather changes, air quality and the benefits of nasal washing.   Exacerbations: - Group verbal and written instruction to provide: warning signs, infection symptoms, calling MD promptly, preventive modes, and value of vaccinations. Review: effective airway clearance, coughing and/or vibration techniques. Create an Sports administrator.   Oxygen: - Individual and group verbal and written instruction on oxygen therapy. Includes supplement oxygen, available portable oxygen systems, continuous and intermittent flow rates, oxygen safety, concentrators, and Medicare reimbursement for oxygen.   Respiratory Medications: - Group verbal and written instruction to review medications for lung disease. Drug class, frequency, complications, importance of spacers, rinsing mouth after steroid MDI's, and proper cleaning methods for nebulizers.   AED/CPR: - Group verbal and written instruction with the use of models to demonstrate the basic use of the AED with the basic ABC's of resuscitation.   Breathing Retraining: - Provides individuals verbal and written instruction on purpose, frequency, and proper technique of diaphragmatic breathing and pursed-lipped breathing. Applies individual practice skills. Flowsheet Row Pulmonary Rehab from 10/24/2016 in [facility] Cardiac and Pulmonary Rehab  Date  10/10/16  Educator  AS  Instruction Review Code  2- meets goals/outcomes      Anatomy and Physiology of the Lungs: - Group verbal and written instruction with the use of models to provide basic lung anatomy and  physiology related to function, structure and complications of lung disease.   Heart Failure: - Group verbal and written instruction on the basics of heart failure: signs/symptoms, treatments, explanation of ejection fraction, enlarged heart and cardiomyopathy.   Sleep Apnea: - Individual verbal and written instruction to review Obstructive Sleep Apnea. Review of risk factors, methods for diagnosing and types of masks and machines for OSA.   Anxiety: - Provides group, verbal and written instruction on the correlation between heart/lung disease and anxiety, treatment options, and management of anxiety.   Relaxation: - Provides group, verbal and written instruction about the benefits of  relaxation for patients with heart/lung disease. Also provides patients with examples of relaxation techniques.   Knowledge Questionnaire Score:     Knowledge Questionnaire Score - 10/04/16 1210      Knowledge Questionnaire Score   Pre Score 4/10       Core Components/Risk Factors/Patient Goals at Admission:     Personal Goals and Risk Factors at Admission - 10/04/16 1218      Core Components/Risk Factors/Patient Goals on Admission    Weight Management Yes;Weight Loss   Intervention Weight Management: Develop a combined nutrition and exercise program designed to reach desired caloric intake, while maintaining appropriate intake of nutrient and fiber, sodium and fats, and appropriate energy expenditure required for the weight goal.;Weight Management: Provide education and appropriate resources to help participant work on and attain dietary goals.;Weight Management/Obesity: Establish reasonable short term and long term weight goals.  Kelli Morris will meet with the [facility] Transplant Dietitian every 6 months. She is working on drinking more water and decrease soda intake.   Admit Weight 180 lb 12.8 oz (82 kg)   Goal Weight: Short Term 170 lb (77.1 kg)   Goal Weight: Long Term 140 lb (63.5 kg)   Expected  Outcomes Short Term: Continue to assess and modify interventions until short term weight is achieved;Long Term: Adherence to nutrition and physical activity/exercise program aimed toward attainment of established weight goal;Weight Loss: Understanding of general recommendations for a balanced deficit meal plan, which promotes 1-2 lb weight loss per week and includes a negative energy balance of [phone] kcal/d;Understanding recommendations for meals to include 15-35% energy as protein, 25-35% energy from fat, 35-60% energy from carbohydrates, less than 217m of dietary cholesterol, 20-35 gm of total fiber daily;Understanding of distribution of calorie intake throughout the day with the consumption of 4-5 meals/snacks;Weight Maintenance: Understanding of the daily nutrition guidelines, which includes 25-35% calories from fat, 7% or less cal from saturated fats, less than 2036mcholesterol, less than 1.5gm of sodium, & 5 or more servings of fruits and vegetables daily   Sedentary Yes   Intervention Provide advice, education, support and counseling about physical activity/exercise needs.;Develop an individualized exercise prescription for aerobic and resistive training based on initial evaluation findings, risk stratification, comorbidities and participant's personal goals.   Expected Outcomes Achievement of increased cardiorespiratory fitness and enhanced flexibility, muscular endurance and strength shown through measurements of functional capacity and personal statement of participant.   Increase Strength and Stamina Yes   Intervention Provide advice, education, support and counseling about physical activity/exercise needs.;Develop an individualized exercise prescription for aerobic and resistive training based on initial evaluation findings, risk stratification, comorbidities and participant's personal goals.   Expected Outcomes Achievement of increased cardiorespiratory fitness and enhanced flexibility,  muscular endurance and strength shown through measurements of functional capacity and personal statement of participant.   Improve shortness of breath with ADL's Yes   Intervention Provide education, individualized exercise plan and daily activity instruction to help decrease symptoms of SOB with activities of daily living.   Expected Outcomes Short Term: Achieves a reduction of symptoms when performing activities of daily living.   Develop more efficient breathing techniques such as purse lipped breathing and diaphragmatic breathing; and practicing self-pacing with activity Yes   Intervention Provide education, demonstration and support about specific breathing techniuqes utilized for more efficient breathing. Include techniques such as pursed lipped breathing, diaphragmatic breathing and self-pacing activity.   Expected Outcomes Short Term: Participant will be able to demonstrate and use breathing techniques as needed throughout daily  activities.   Increase knowledge of respiratory medications and ability to use respiratory devices properly  Yes  Kelli Morris takes [place]. She is having nausea with the drug, but she is determined to continue on the medication.    Intervention Provide education and demonstration as needed of appropriate use of medications, inhalers, and oxygen therapy.   Expected Outcomes Short Term: Achieves understanding of medications use. Understands that oxygen is a medication prescribed by physician. Demonstrates appropriate use of inhaler and oxygen therapy.   Lipids Yes   Intervention Provide education and support for participant on nutrition & aerobic/resistive exercise along with prescribed medications to achieve LDL <24m, HDL >441m   Expected Outcomes Short Term: Participant states understanding of desired cholesterol values and is compliant with medications prescribed. Participant is following exercise prescription and nutrition guidelines.;Long Term: Cholesterol controlled with  medications as prescribed, with individualized exercise RX and with personalized nutrition plan. Value goals: LDL < 7029mHDL > 40 mg.      Core Components/Risk Factors/Patient Goals Review:      Goals and Risk Factor Review    Row Name 10/10/16 1233 10/24/16 0830           Core Components/Risk Factors/Patient Goals Review   Personal Goals Review Develop more efficient breathing techniques such as purse lipped breathing and diaphragmatic breathing and practicing self-pacing with activity. Sedentary;Increase Strength and Stamina;Develop more efficient breathing techniques such as purse lipped breathing and diaphragmatic breathing and practicing self-pacing with activity.;Improve shortness of breath with ADL's;Weight Management/Obesity      Review Pursed lip breathing technique was discussed with the patient. The patient demonstrated understanding of this technique and when to use it during exercise and ADL's.  Kelli MosMonforts a goal of at least 9lbs loss and does meet with the [facility] dietitian. She is compliant with her OFEV for her pulmonary fibrosis and her statin drug for her lipids. Due to a $20 dollar copay, we are planning to [facility] have [facility] continue her exercise in [employer]    Expected Outcomes Patient will use PLB during exercise and ADL's to help improve oxygen saturations and control SOB.   --         Core Components/Risk Factors/Patient Goals at Discharge (Final Review):      Goals and Risk Factor Review - 10/24/16 0830      Core Components/Risk Factors/Patient Goals Review   Personal Goals Review Sedentary;Increase Strength and Stamina;Develop more efficient breathing techniques such as purse lipped breathing and diaphragmatic breathing and practicing self-pacing with activity.;Improve shortness of breath with ADL's;Weight Management/Obesity   Review Kelli Morris a goal of at least 9lbs loss and does meet with the [facility] dietitian.  She is compliant with her OFEV for her pulmonary fibrosis and her statin drug for her lipids. Due to a $20 dollar copay, we are planning to [facility] continue her exercise in [employer]    ITP Comments:     ITP Comments    Row Name 11/08/16 1350 11/23/16 1451         ITP Comments Called Kelli Morris attended 10/24/16. She has had adverse results from her new medication , Ofev. She plans to return to [facility]  Kelli Morris today and informed us Koreae was unable to complete the colonoscopy and will have to have a virtual colonoscopy. She plans to return the first of the year.  Comments: 30 day note review

## 2016-12-19 NOTE — Telephone Encounter (Signed)
Patient is currently admitted at hospital, last filled 11/18/16 60 0rf

## 2016-12-20 ENCOUNTER — Ambulatory Visit: Payer: Self-pay | Admitting: Family Medicine

## 2016-12-20 ENCOUNTER — Inpatient Hospital Stay: Payer: Medicare Other

## 2016-12-20 DIAGNOSIS — Z515 Encounter for palliative care: Secondary | ICD-10-CM

## 2016-12-20 DIAGNOSIS — Z7189 Other specified counseling: Secondary | ICD-10-CM

## 2016-12-20 DIAGNOSIS — J8410 Pulmonary fibrosis, unspecified: Secondary | ICD-10-CM

## 2016-12-20 LAB — GLUCOSE, CAPILLARY
GLUCOSE-CAPILLARY: 93 mg/dL (ref 65–99)
Glucose-Capillary: 111 mg/dL — ABNORMAL HIGH (ref 65–99)
Glucose-Capillary: 161 mg/dL — ABNORMAL HIGH (ref 65–99)
Glucose-Capillary: 128 mg/dL — ABNORMAL HIGH (ref 65–99)

## 2016-12-20 LAB — PROCALCITONIN: Procalcitonin: <0.1 ng/mL

## 2016-12-20 MED ORDER — PREDNISONE 20 MG PO TABS
40.0000 mg | ORAL_TABLET | Freq: Every day | ORAL | Status: DC
  Administered 2016-12-21 – 2016-12-26 (×6): 40 mg via ORAL
  Filled 2016-12-20 (×6): qty 2

## 2016-12-20 NOTE — Care Management (Signed)
Spoke with patient, husband and daughter.  Patient currently on high flow 50 liters at 50%. Confirms that is undergoing work up for transplant with [facility] but states as long as she is on steroids, she can not be considered. Patient says that at present the plan is for her to go home with hospice services.  She and her husband are moving in six weeks to [facility] so services would have to be transferred.  Spoke with cardiopulmonary about being able to track progress, interventions/ response to  02 adjustments.  Discussed plan of care with pulmonology.  Discussed current oxygen requirements ca not be met in the home.  Patient would not be considered for ltac by her insurance at present.  Spoke with Craige Cotta with [facility] as this would be patient's agency preference.  CM is in the process of contacting patient's insurance provider regarding Pirfenidone.  If patient chooses to be followed by hospice- this medication may not be covered.

## 2016-12-20 NOTE — Progress Notes (Signed)
Pharmacy Antibiotic Note  Kelli Morris is a 66 y.o. female admitted on 12/16/2016 with pneumonia.  Pharmacy has been consulted for piperacillin/tazobactam and vancomycin dosing. Vancomycin discontinued 1/13.  Plan: Day 5    -Piperacillin/tazobactam 3.375 g IV q8h EI   PCT on 12/17/16 and 12/20/16 both = <0.10.      Height: 5\' 3"  (160 cm) Weight: 180 lb 9.6 oz (81.9 kg) IBW/kg (Calculated) : 52.4  Temp (24hrs), Avg:97.8 F (36.6 C), Min:97.6 F (36.4 C), Max:98.1 F (36.7 C)   Recent Labs Lab 12/16/16 1033 12/16/16 1308 12/17/16 0521 12/17/16 1545 12/19/16 0640  WBC 12.3*  --  7.6  --  10.9  CREATININE 0.91  --  0.86 1.09* 0.76  LATICACIDVEN  --  1.0  --   --   --     Estimated Creatinine Clearance: 71.1 mL/min (by C-G formula based on SCr of 0.76 mg/dL).    No Known Allergies  Antimicrobials this admission: vancomycin 1/12 >> 1/13 Piperacillin/tazobactam 1/12 >>   Dose adjustments this admission:  Microbiology results: 1/12 sputum= Consistent with normal respiratory flora.  MRSA PCR neg.  Thank you for allowing pharmacy to be a part of this patient's care.  Noralee Space, PharmD, BCPS Clinical Pharmacist 12/20/2016 7:48 AM

## 2016-12-20 NOTE — Progress Notes (Signed)
* [facility]     Assessment and Plan:  Acute on chronic hypoxic respiratory failure secondary to advanced IPF with rapidly progressive course over past month.  -discussed with patient and family that she has lost significant amount of lung function and is unlikely to get it back. She will therefore be on a large amount of oxygen and I it does not appear that this can be weaned off.  --Goal at this time is to get her home with hospice. Therefore I have instructed RN and RT to take her off of hi-flow and place her on wall oxygen at max 12L with humidification via mask. If this can be symptomatically tolerated then she can be discharged to hospice with this level of oxygen or less.    Advance Care Planning: Discussed with patient and family at bedside that the patient has a terminal disease. She understands that she has a terminal disease, however she still has intention to move in about 6 weeks, to continue with her appointments and she is wondering how she can get to the ER if her breathing declines.  I explained in detail the hospice philosophy to allow her to be comfortable at home and to try to avoid going to the hospital. I explained that she would not likely be going to all of her future medical appointmentment. Lastly I discussed the possibility that she may not live another 6 weeks in order to make a move to another home, and in her current state she might not be able to make this move.  I also discussed with pt's daughter that other treatments of IPF would not be helpful at this time.  The plan of care was discussed with RN, Case worker, RT.  Time spent: 30 min.     Date: 12/20/2016  MRN# [id number] Kelli Morris May 17, 1951   Kelli Morris is a 66 y.o. old female seen in follow up for chief complaint of  Chief Complaint  Patient presents with  . Shortness of Breath     HPI:  No new complaints, feels that breathing is about the same.   Allergies:  Patient  has no known allergies.  Review of Systems: Gen:  Denies  fever, sweats. HEENT: Denies blurred vision. Cvc:  No dizziness, chest pain or heaviness Resp:   Denies cough or sputum porduction. Gi: Denies swallowing difficulty, stomach pain. constipation, bowel incontinence Gu:  Denies bladder incontinence, burning urine Ext:   No Joint pain, stiffness. Skin: No skin rash, easy bruising. Endoc:  No polyuria, polydipsia. Psych: No depression, insomnia. Other:  All other systems were reviewed and found to be negative other than what is mentioned in the HPI.   Physical Examination:   VS: BP 118/67 (BP Location: Right Arm)   Pulse 90   Temp 98.1 F (36.7 C) (Oral)   Resp 16   Ht 5\' 3"  (1.6 m)   Wt 180 lb 9.6 oz (81.9 kg)   SpO2 97%   BMI 31.99 kg/m   General Appearance: No distress  Neuro:without focal findings,  speech normal,  HEENT: PERRLA, EOM intact. Pulmonary: normal breath sounds, No wheezing.   CardiovascularNormal S1,S2.  No m/r/g.   Abdomen: Benign, Soft, non-tender. Renal:  No costovertebral tenderness  GU:  Not performed at this time. Endoc: No evident thyromegaly, no signs of acromegaly. Skin:   warm, no rash. Extremities: normal, no cyanosis, clubbing.   LABORATORY PANEL:   CBC  Recent Labs Lab 12/19/16 0640  WBC  10.9  HGB 11.0*  HCT 31.9*  PLT 266   ------------------------------------------------------------------------------------------------------------------  Chemistries   Recent Labs Lab 12/17/16 1545 12/19/16 0640  NA 130* 134*  K 3.7 4.0  CL 96* 98*  CO2 21* 25  GLUCOSE 152* 113*  BUN 24* 17  CREATININE 1.09* 0.76  CALCIUM 8.6* 8.8*  AST 25  --   ALT 15  --   ALKPHOS 84  --   BILITOT 0.5  --    ------------------------------------------------------------------------------------------------------------------  Cardiac Enzymes  Recent Labs Lab 12/16/16 1033  TROPONINI <0.03    ------------------------------------------------------------  RADIOLOGY:   No results found for this or any previous visit. Results for orders placed during the hospital encounter of 12/16/16  DG Chest 2 View   Narrative CLINICAL DATA:  Shortness of breath.  EXAM: CHEST  2 VIEW  COMPARISON:  12/06/2016 .  FINDINGS: Cardiomegaly. Progressive bilateral pulmonary infiltrates consistent with progressive bilateral pulmonary edema and/or pneumonia. No pleural effusion or pneumothorax.  IMPRESSION: Progressive bilateral pulmonary infiltrates consistent with pulmonary edema and/or pneumonia. Persistent cardiomegaly .   Electronically Signed   By: Marcello Moores  Register   On: 12/16/2016 11:07    ------------------------------------------------------------------------------------------------------------------  Thank  you for allowing [facility] to assist in the care of your patient. Our recommendations are noted above.  Please contact us if we can be of further service.   Marda Stalker, MD.   Pulmonary and Critical Care Office Number: [phone]  Patricia Pesa, M.D.  Vilinda Boehringer, M.D.  Merton Border, M.D  12/20/2016

## 2016-12-20 NOTE — Progress Notes (Signed)
[facility] [place] NAME: Kelli Morris    MR#:  [id number]  DATE OF BIRTH:  1950/12/24  SUBJECTIVE:  CHIEF COMPLAINT:  Patient's shortness of breath is better, on high flow oxygen, 50 % fio2 Reporting dry cough   REVIEW OF SYSTEMS:  CONSTITUTIONAL: No fever, fatigue or weakness.  EYES: No blurred or double vision.  EARS, NOSE, AND THROAT: No tinnitus or ear pain.  RESPIRATORY: Improving cough, shortness of breath improved, denies wheezing or hemoptysis.  CARDIOVASCULAR: No chest pain, orthopnea, edema.  GASTROINTESTINAL: No nausea, vomiting, diarrhea or abdominal pain.  GENITOURINARY: No dysuria, hematuria.  ENDOCRINE: No polyuria, nocturia,  HEMATOLOGY: No anemia, easy bruising or bleeding SKIN: No rash or lesion. MUSCULOSKELETAL: No joint pain or arthritis.   NEUROLOGIC: No tingling, numbness, weakness.  PSYCHIATRY: No anxiety or depression.   DRUG ALLERGIES:  No Known Allergies  VITALS:  Blood pressure 107/66, pulse (!) 110, temperature 97.9 F (36.6 C), temperature source Oral, resp. rate 20, height 5\' 3"  (1.6 m), weight 81.9 kg (180 lb 9.6 oz), SpO2 90 %.  PHYSICAL EXAMINATION:  GENERAL:  66 y.o.-year-old patient lying in the bed with no acute distress.  EYES: Pupils equal, round, reactive to light and accommodation. No scleral icterus. Extraocular muscles intact.  HEENT: Head atraumatic, normocephalic. Oropharynx and nasopharynx clear.  NECK:  Supple, no jugular venous distention. No thyroid enlargement, no tenderness.  LUNGS: Mod breath sounds bilaterally, no wheezing, rales,rhonchi . Bilateral crackles are present No use of accessory muscles of respiration.  CARDIOVASCULAR: S1, S2 normal. No murmurs, rubs, or gallops.  ABDOMEN: Soft, nontender, nondistended. Bowel sounds present. No organomegaly or mass.  EXTREMITIES: No pedal edema, cyanosis, or clubbing.  NEUROLOGIC: Cranial nerves II through XII are intact. Muscle  strength 5/5 in all extremities. Sensation intact. Gait not checked.  PSYCHIATRIC: The patient is alert and oriented x 3.  SKIN: No obvious rash, lesion, or ulcer.    LABORATORY PANEL:   CBC  Recent Labs Lab 12/19/16 0640  WBC 10.9  HGB 11.0*  HCT 31.9*  PLT 266   ------------------------------------------------------------------------------------------------------------------  Chemistries   Recent Labs Lab 12/17/16 1545 12/19/16 0640  NA 130* 134*  K 3.7 4.0  CL 96* 98*  CO2 21* 25  GLUCOSE 152* 113*  BUN 24* 17  CREATININE 1.09* 0.76  CALCIUM 8.6* 8.8*  AST 25  --   ALT 15  --   ALKPHOS 84  --   BILITOT 0.5  --    ------------------------------------------------------------------------------------------------------------------  Cardiac Enzymes  Recent Labs Lab 12/16/16 1033  TROPONINI <0.03   ------------------------------------------------------------------------------------------------------------------  RADIOLOGY:  Dg Chest 1 View  Result Date: 12/20/2016 CLINICAL DATA:  Pneumonia.  Shortness of breath. EXAM: CHEST 1 VIEW COMPARISON:  12/17/2016. FINDINGS: Cardiomegaly with mild pulmonary vascular prominence. Bilateral interstitial prominence is again noted most likely related chronic interstitial lung disease. Superimposed interstitial edema and/or pneumonitis cannot be excluded. No pleural effusion or pneumothorax. IMPRESSION: Cardiomegaly with mild pulmonary vascular prominence. Bilateral chronic nterstitial disease noted. Superimposed mild interstitial edema and/or pneumonitis cannot be excluded . Electronically Signed   By: Marcello Moores  Register   On: 12/20/2016 10:33    EKG:   Orders placed or performed in visit on 12/16/16  . EKG 12-Lead  . EKG 12-Lead  . EKG 12-Lead    ASSESSMENT AND PLAN:   66 year old female with idiopathic pulmonary fibrosis who presents with acute hypoxic respiratory failure due to worsening of underlying pulmonary  fibrosis.  1. Acute hypoxic respiratory failure in the setting of progressive pulmonary fibrosis with Possible  superimposed interstitial pneumonia: Clinically Slow improvement still on high flow oxygen- 50 % FIo2 I will continue her on nebulizer therapy every 6 hours I will continue  with IV Solu-Medrol, at time ofd/c prednisone 40 mg until seen by dr.Simonds before Piferidone approval Continue empiric IV antibiotics Zosyn and discontinue vancomycin, continue high flow oxygen Appreciate pulmonology recommendations  Patient has an appointment to follow[facility] in February   2. Hypothyroidism: Continue Synthroid  3. Depression: Continue Zoloft  4. Hyperlipidemia: Continue atorvastatin   5. dvt px : lovenox  Generalized weakness -PT- HHPT recommended F/u with CM for high flow o2 arrangement  to go home   All the records are reviewed and case discussed with Care Management/Social Workerr. Management plans discussed with the patient, family and they are in agreement.  CODE STATUS: partial code,husband and daughter HCPOA  TOTAL TIME TAKING CARE OF THIS PATIENT: 36  minutes.   POSSIBLE D/C IN 2  DAYS, DEPENDING ON CLINICAL CONDITION.  Note: This dictation was prepared with Dragon dictation along with smaller phrase technology. Any transcriptional errors that result from this process are unintentional.   Nicholes Mango M.D on 12/20/2016 at 8:06 PM  Between 7am to 6pm - Pager - [phone] After 6pm go to [email or web address] - password [email or web address]  [facility]  Office  [phone]  CC: Primary care physician; Tommi Rumps, MD

## 2016-12-20 NOTE — Telephone Encounter (Signed)
Patient notified

## 2016-12-20 NOTE — Progress Notes (Signed)
PT Hold Note  Patient Details Name: Kelli Morris MRN: [id number] DOB: [birth year]   Cancelled Treatment:    Reason Eval/Treat Not Completed: Patient declined, no reason specified. Attempted to see patient at 28. She had just returned to bed and is still SOB. Would not like to work with therapy currently but agrees to potentially attempt later. Followed charting and per Dr. Ashby Dawes pt to be taken of HFNC to see if she can tolerate for discharge purposes. Elected to hold pt for PM and attempt tomorrow if appropriate and pt willing.   Lyndel Safe Ragan Duhon PT, DPT   Alinah Sheard 12/20/2016, 5:18 PM

## 2016-12-20 NOTE — Consult Note (Signed)
Consultation Note Date: 12/20/16  Patient Name: Kelli Morris  DOB: [birth year]  MRN: [id number]  Age / Sex: 66 y.o., female  PCP: Leone Haven, MD Referring Physician: Nicholes Mango, MD  Reason for Consultation: Establishing goals of care and Hospice Evaluation  HPI/Patient Profile: 66 y.o. female  with past medical history of pulmonary fibrosis, essential hypertension, hyperlipidemia, hypothyroidism, and GERD admitted on 12/16/2016 with shortness of breath and fevers. CT scan of chest revealing progression of pulmonary fibrosis. Acute hypoxic respiratory failure in the setting of progressive pulmonary fibrosis with possible superimposed interstitial pneumonia. Dr. Alva Garnet follows her outpatient and saw her on 1/14. Currently on PO levofloxacin, steroids, and nebs. Requiring higher oxygen requirements. They have discussed initiating pirfenidone therapy. Follow-up appointment with Dr. Alva Garnet on 1/26 and [facility] transplant team in February. Palliative medicine consultation for goals of care/hospice evaluation.   Clinical Assessment and Goals of Care: I have reviewed medical records, received report from RN and RN case manager, and met with patient and daughter at bedside to discuss diagnosis, prognosis, GOC, disposition and options.  Introduced Palliative Medicine as specialized medical care for people living with serious illness. It focuses on providing relief from the symptoms and stress of a serious illness. The goal is to improve quality of life for both the patient and the family.  Ms. Micheletti was diagnosed with pulmonary fibrosis in January 2017. She has been followed by Dr. Alva Garnet outpatient since diagnosis. Her health started to decline about 1.5 months ago when she was hospitalized in December. Ms. Ransome is very tearful during the conversation. She understands this is a progressive and terminal diagnosis. She  understands this will "kill her and she may not have much more time" but "not ready to give up and take morphine to die." She speaks of conversations with Dr. Alva Garnet about starting the new medication. They have talked about a lung transplant but she must be off steroids before placed on the list. She plans to follow-up with him when he is back in town.    Advanced directives and concepts specific to code status were discussed. Patient tells me she would not want to be placed on a ventilator but she would want resuscitation attempted. Educated on my recommendation for DNR with a chronic lung diagnosis and that majority of scenarios require intubation/mechanical ventilation when CPR is performed. Provided her a [facility] copy to read and encouraged her to continue conversations with family regarding these decisions she will be faced with in the future.   Hospice and Palliative Care services outpatient were explained and offered. Patient and daughter agree hospice is a good option moving forward in order to assist with symptom management and prevent re-hospitalization if she should decline. Also, that hospice will already be in place if unable to further pursue lung transplant. She is most concerned about needing higher oxygen requirements at home. Explained that hospice liaison will discuss this information with her before discharge home.   Answered questions and concerns. PMT will continue to support holistically.  SUMMARY OF RECOMMENDATIONS    Discussed and educated on code status. Remains partial code-no intubation. Encouraged continued conversations with her family on my recommendation for DNR with chronic, progressive pulmonary fibrosis.  Patient understands this is a terminal disease and time may be short, but still wanting to follow-up with Dr. Alva Garnet next week as planned to discuss initiating Pirfenidone.   Patient and family agreeable with home hospice services. Discussed with RN care  Freight forwarder.   PMT will continue to shadow chart and support family during hospitalization.   Code Status/Advance Care Planning:  Limited code-no intubation   Symptom Management:   Per attending  Palliative Prophylaxis:   Frequent Pain Assessment and Oral Care  Psycho-social/Spiritual:   Desire for further Chaplaincy support:yes  Additional Recommendations: Caregiving  Support/Resources and Education on Hospice  Prognosis:   < 6 months  Discharge Planning: Home with Hospice      Primary Diagnoses: Present on Admission: . Acute respiratory failure ([place])   I have reviewed the medical record, interviewed the patient and family, and examined the patient. The following aspects are pertinent.  Past Medical History:  Diagnosis Date  . Arrhythmia    afib  . Cancer ([place])    uterine  . Fibromyalgia    Chronic fatigue  . GERD (gastroesophageal reflux disease)   . Headache(784.0)   . History of headache   . History of low back pain   . Hyperlipidemia   . Hypertension   . Hypothyroidism   . Idiopathic pulmonary fibrosis ([place])   . Lyme disease   . Pulmonary fibrosis ([place])   . PVD (peripheral vascular disease) ([place])   . Strain, cervical   . Thrombosis   . Tobacco use    Social History   Social History  . Marital status: Married    Spouse name: Shanon Brow  . Number of children: 2  . Years of education: 14   Occupational History  . N/A     Disability   Social History Main Topics  . Smoking status: Former Smoker    Packs/day: 0.50    Years: 45.00    Types: Cigarettes  . Smokeless tobacco: Never Used  . Alcohol use Yes     Comment: Consumes alcohol on occasion  . Drug use: No  . Sexual activity: Not Asked   Other Topics Concern  . None   Social History Narrative   Patient is married for 82 years and lives with her husband Shanon Brow)   Retired    Scientist, physiological- college   Right handed.   Caffeine- sodas three daily.   Family History  Problem Relation Age of  Onset  . Uterine cancer Mother   . Leukemia Mother   . Congestive Heart Failure Mother   . COPD Mother   . Diabetes Father   . Congestive Heart Failure Father   . Heart disease Maternal Grandfather   . Heart disease Paternal Grandfather   . Arthritis Brother   . Neuropathy Brother   . Breast cancer Neg Hx    Scheduled Meds: . atorvastatin  40 mg Oral q1800  . budesonide (PULMICORT) nebulizer solution  0.5 mg Nebulization BID  . enoxaparin (LOVENOX) injection  40 mg Subcutaneous Q24H  . insulin aspart  0-15 Units Subcutaneous TID WC  . insulin aspart  0-5 Units Subcutaneous QHS  . ipratropium-albuterol  3 mL Nebulization Q6H  . levothyroxine  25 mcg Oral QAC breakfast  . pantoprazole  40 mg Oral BID  . predniSONE  40 mg  Oral Q breakfast  . sertraline  50 mg Oral Daily  . sodium chloride flush  3 mL Intravenous Q12H   Continuous Infusions: PRN Meds:.sodium chloride, acetaminophen, ALPRAZolam, HYDROcodone-acetaminophen, ibuprofen, ondansetron **OR** ondansetron (ZOFRAN) IV, sodium chloride flush Medications Prior to Admission:  Prior to Admission medications   Medication Sig Start Date End Date Taking? Authorizing Provider  acetaminophen (TYLENOL) 325 MG tablet Take 650 mg by mouth every 6 (six) hours as needed.   Yes Historical Provider, MD  albuterol (PROVENTIL HFA;VENTOLIN HFA) 108 (90 Base) MCG/ACT inhaler Inhale 1-2 puffs into the lungs every 6 (six) hours as needed for wheezing or shortness of breath.   Yes Historical Provider, MD  Aspirin-Caffeine (BC FAST PAIN RELIEF ARTHRITIS) 1000-65 MG PACK Take 800 mg by mouth 4 (four) times daily.    Yes Historical Provider, MD  atorvastatin (LIPITOR) 40 MG tablet TAKE 1 TABLET (40 MG TOTAL) BY MOUTH DAILY. 10/03/16  Yes Wellington Hampshire, MD  azithromycin (ZITHROMAX) 250 MG tablet Please take 500 mg (2 tablets) by mouth today, then take 250 mg (1 tablet) by mouth daily 12/12/16  Yes Leone Haven, MD  ibuprofen (ADVIL,MOTRIN) 200 MG  tablet Take 200 mg by mouth every 6 (six) hours as needed.   Yes Historical Provider, MD  levothyroxine (SYNTHROID, LEVOTHROID) 25 MCG tablet Take 1 tablet (25 mcg total) by mouth daily before breakfast. 07/08/16  Yes Leone Haven, MD  pantoprazole (PROTONIX) 40 MG tablet Take 40 mg by mouth 2 (two) times daily.   Yes Historical Provider, MD  predniSONE (DELTASONE) 10 MG tablet Take 1 tablet (10 mg total) by mouth daily with breakfast. 12/06/16  Yes Wilhelmina Mcardle, MD  sertraline (ZOLOFT) 50 MG tablet TAKE ONE TABLET BY MOUTH DAILY 11/09/16  Yes Leone Haven, MD  ALPRAZolam Duanne Moron) 0.5 MG tablet TAKE ONE TABLET BY MOUTH TWICE A DAY AS NEEDED FOR ANXIETY 12/19/16   Leone Haven, MD   No Known Allergies Review of Systems  Constitutional: Positive for activity change.  Respiratory: Positive for shortness of breath.   Neurological: Positive for weakness.   Physical Exam  Constitutional: She is oriented to person, place, and time. She is cooperative.  HENT:  Head: Normocephalic and atraumatic.  Cardiovascular: Regular rhythm and normal heart sounds.   Pulmonary/Chest: She has decreased breath sounds.  Dyspnea with exertion  Abdominal: Normal appearance.  Neurological: She is alert and oriented to person, place, and time.  Skin: Skin is warm and dry.  Psychiatric: She has a normal mood and affect. Her speech is normal and behavior is normal.  Nursing note and vitals reviewed.  Vital Signs: BP 122/63   Pulse 85   Temp 97.7 F (36.5 C) (Oral)   Resp (!) 24   Ht '5\' 3"'  (1.6 m)   Wt 81.9 kg (180 lb 9.6 oz)   SpO2 95%   BMI 31.99 kg/m  Pain Assessment: 0-10 POSS *See Group Information*: 1-Acceptable,Awake and alert Pain Score: 6    SpO2: SpO2: 95 % O2 Device:SpO2: 95 % O2 Flow Rate: .O2 Flow Rate (L/min): 6 L/min  IO: Intake/output summary:   Intake/Output Summary (Last 24 hours) at 12/21/16 0947 Last data filed at 12/21/16 0559  Gross per 24 hour  Intake               240 ml  Output             1552 ml  Net            -  1312 ml    LBM: Last BM Date: 12/18/16 Baseline Weight: Weight: 83 kg (183 lb) Most recent weight: Weight: 81.9 kg (180 lb 9.6 oz)     Palliative Assessment/Data: PPS 50%   Flowsheet Rows   Flowsheet Row Most Recent Value  Intake Tab  Referral Department  Pulmonary  Unit at Time of Referral  Cardiac/Telemetry Unit  Palliative Care Primary Diagnosis  Pulmonary  Date Notified  12/19/16  Palliative Care Type  New Palliative care  Reason for referral  Clarify Goals of Care  Date of Admission  12/16/16  Date first seen by Palliative Care  12/20/16  # of days Palliative referral response time  1 Day(s)  # of days IP prior to Palliative referral  3  Clinical Assessment  Palliative Performance Scale Score  50%  Psychosocial & Spiritual Assessment  Palliative Care Outcomes  Patient/Family meeting held?  Yes  Who was at the meeting?  patient and daughter  Palliative Care Outcomes  Clarified goals of care, Counseled regarding hospice, Provided psychosocial or spiritual support, ACP counseling assistance      Time In: 1615 Time Out: 1730 Time Total: 78mn Greater than 50%  of this time was spent counseling and coordinating care related to the above assessment and plan.  Signed by:  MIhor Dow FNP-C Palliative Medicine Team  Phone: [phone]: [phone]  Please contact Palliative Medicine Team phone at [phone] questions and concerns.  For individual provider: See AShea Evans

## 2016-12-21 DIAGNOSIS — Z515 Encounter for palliative care: Secondary | ICD-10-CM

## 2016-12-21 DIAGNOSIS — Z7189 Other specified counseling: Secondary | ICD-10-CM

## 2016-12-21 DIAGNOSIS — J8410 Pulmonary fibrosis, unspecified: Secondary | ICD-10-CM

## 2016-12-21 LAB — GLUCOSE, CAPILLARY
GLUCOSE-CAPILLARY: 172 mg/dL — AB (ref 65–99)
GLUCOSE-CAPILLARY: 145 mg/dL — AB (ref 65–99)
GLUCOSE-CAPILLARY: 160 mg/dL — AB (ref 65–99)
Glucose-Capillary: 95 mg/dL (ref 65–99)

## 2016-12-21 NOTE — Care Management (Signed)
Patient is now on nasal cannula 02 requiring increase up to 10 liters with activity and recovers in a few minutes. This is a marked improvement from yesterday. It will be much easier to meet these decreased requirements in the home.  Patient and her family very tearful regarding conversation with pulmonology.  Even if discharges home with hospice services- still would want to keep MD appointments.  Obtained dosing information for Pirfenidone (Esbriet): 267 mg tabs: 1 tid x 7 days; if tolerates- 2 tid x 7 days and if tolerates then 3 tid.   will upload this information to cover My Meds.  Patient said when she was on Ofev, part of it was covered by a grant through the drug company.  If that is needed for this drug then staff in pulmonologist office would complete.  Updated Craige Cotta with [facility]

## 2016-12-21 NOTE — Progress Notes (Signed)
Physical Therapy Treatment Patient Details Name: Kelli Morris MRN: [id number] DOB: [birth year] Today's Date: 12/21/2016    History of Present Illness Kelli Morris  is a 66 y.o. female with a known history of  Progressive pulmonary fibrosis, GERD, hyperlipidemia, essential hypertension, hypothyroidism and anxiety who is presenting with shortness of breath. Patient was hospitalized with same in December. Patient had a CT scan of the chest which showed progression of her pulmonary fibrosis. The emergency room physician discussed the case with her primary pulmonologist Dr. Gilberto Better who recommended patient placing patient on broad-spectrum antibiotics due to fevers that she was experiencing at home. Patient reports low-grade fevers at home. Has chronic cough which is unchanged also complains of pressure in her chest due to breathing troubles. She reports that her breath has gotten worst over the past few days.    PT Comments    Pt is able to complete seated exercises at EOB with notable fatigue and DOE. She is able to ambulate multiple laps in room for a total of approximately 40'. Pt states that she has been instructed to increase supplemental O2 to 10L/min during activity. On 10L/min O2 via [place] SaO2 progressively drops during ambulation but more notably after she finishes walking. Pt sits down and SaO2 continues to decline to 79% with HR increasing to 125 bpm. However it quickly rebounds to mid 80's and then requires an additional 30-45 seconds to recover to 90% and HR to decrease to 110 bpm. In total pt requires about 60 seconds of sitting pursed lip breathing for SaO2 to recover to 90% on 10L/min O2. Supplemental O2 dropped back to 6L/min at rest and pt is able to maintain SaO2 at or above 90%.   Follow Up Recommendations  Home health PT;Other (comment) (For balance and strength)     Equipment Recommendations  Other (comment) (May need BSC prior to discharge)    Recommendations for Other Services        Precautions / Restrictions Precautions Precautions: Fall Restrictions Weight Bearing Restrictions: No Other Position/Activity Restrictions: maintain SaO2>90%    Mobility  Bed Mobility Overal bed mobility: Modified Independent             General bed mobility comments: Fair speed and sequencing with HOB elevated and bed rails utilized  Transfers Overall transfer level: Needs assistance Equipment used: None Transfers: Sit to/from Stand Sit to Stand: Supervision         General transfer comment: Pt demonstrates improved speed and sequencing with transfers on this date. Stable once upright in standing  Ambulation/Gait Ambulation/Gait assistance: Min guard Ambulation Distance (Feet): 40 Feet Assistive device: None Gait Pattern/deviations: Decreased step length - right;Decreased step length - left Gait velocity: Decreased but functional for household mobility Gait velocity interpretation: <1.8 ft/sec, indicative of risk for recurrent falls General Gait Details: Pt able to ambulate multiple laps in room for a total of approximately 40'. Pt states that she has been instructed to increase supplemental O2 to 10L/min during activity. SaO2 progressively drops during ambulation but more notably after she finishes walking. Pt sits down and SaO2 continues to decline to 79% with HR increasing to 125 bpm. However it quickly rebounds to mid 80's and then requires an addition 30-45 seconds to recover to 90% and HR to decrease to 110 bpm. In total pt requires about 60 seconds of sitting pursed lip breathing for SaO2 to recover to 90%.    Stairs            Emergency planning/management officer  Modified Rankin (Stroke Patients Only)       Balance Overall balance assessment: Needs assistance Sitting-balance support: No upper extremity supported Sitting balance-Leahy Scale: Good     Standing balance support: No upper extremity supported Standing balance-Leahy Scale: Fair Standing  balance comment: Able to maintain wide and narrow stance balance. Positive [place]berg for increased sway but no LOB. Single leg balance is 3-4 seconds on each side                    Cognition Arousal/Alertness: Awake/alert Behavior During Therapy: WFL for tasks assessed/performed Overall Cognitive Status: Within Functional Limits for tasks assessed                      Exercises General Exercises - Lower Extremity Hip ABduction/ADduction: Strengthening;Both;Seated;10 reps Hip Flexion/Marching: Strengthening;Both;10 reps;Seated    General Comments        Pertinent Vitals/Pain Pain Assessment: No/denies pain    Home Living                      Prior Function            PT Goals (current goals can now be found in the care plan section) Acute Rehab PT Goals Patient Stated Goal: Maintain strength PT Goal Formulation: With patient/family Time For Goal Achievement: 01/02/17 Potential to Achieve Goals: Fair Progress towards PT goals: Progressing toward goals    Frequency    Min 2X/week      PT Plan Current plan remains appropriate    Co-evaluation             End of Session Equipment Utilized During Treatment: Gait belt;Oxygen Activity Tolerance: Treatment limited secondary to medical complications (Comment);Other (comment) (Limited due to cardiopulmonary status) Patient left: with family/visitor present;in bed;with bed alarm set;with call bell/phone within reach     Time: [id number] PT Time Calculation (min) (ACUTE ONLY): 15 min  Charges:  $Therapeutic Exercise: 8-22 mins                    G Codes:      Lyndel Safe Huprich PT, DPT   Huprich,Jason 12/21/2016, 12:25 PM

## 2016-12-21 NOTE — Care Management (Signed)
Spoke with pulmonogist regarding prior authorization for Pirfenidone (Esbriet) Unable to proceed without information regarding dosage.  It is thought that the patient did not tolerate this drug in the past.  Found in earlier notes by Dr Alva Garnet, patient did not tolerate "ofev."   Unable to proceed with initiating prior auth without dosage, frequency and quanitity.  Have initiated the auth through Cover My Meds

## 2016-12-21 NOTE — Progress Notes (Signed)
Care discussed with Case management. Pt would like to try a new therapy and follow up with Dr. Alva Garnet.  The patient has been titrated off of high flow to mask ventilation with Kelli Morris with which she will go home.   --Esbriet dose will be 267 mg tabs:  1 tab tid for 1 week, then 2 tabs tid for 1 week, then continue 3 tabs tid to be continued as tolerated.  --Continue prednisone 40 mg daily until follow up with Dr. Alva Garnet.   Kelli Morris, M.D.
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
## 2016-12-21 NOTE — Care Management Important Message (Signed)
Important Message  Patient Details  Name: Kelli Morris MRN: [id number] Date of Birth: 1951/08/17   Medicare Important Message Given:  Yes Initial signed IM printed from Epic and given to patient.    Katrina Stack, RN 12/21/2016, 8:16 AM

## 2016-12-21 NOTE — Progress Notes (Signed)
[facility] [place] NAME: Kelli Morris    MR#:  [id number]  DATE OF BIRTH:  09/03/1951  SUBJECTIVE:  CHIEF COMPLAINT:  Patient's shortness of breath is better, on oxygen 6 lit via nasal canula , using 10 lit with min ambulation Reporting dry cough   REVIEW OF SYSTEMS:  CONSTITUTIONAL: No fever, fatigue or weakness.  EYES: No blurred or double vision.  EARS, NOSE, AND THROAT: No tinnitus or ear pain.  RESPIRATORY: Improving cough, shortness of breath improved, denies wheezing or hemoptysis.  CARDIOVASCULAR: No chest pain, orthopnea, edema.  GASTROINTESTINAL: No nausea, vomiting, diarrhea or abdominal pain.  GENITOURINARY: No dysuria, hematuria.  ENDOCRINE: No polyuria, nocturia,  HEMATOLOGY: No anemia, easy bruising or bleeding SKIN: No rash or lesion. MUSCULOSKELETAL: No joint pain or arthritis.   NEUROLOGIC: No tingling, numbness, weakness.  PSYCHIATRY: No anxiety or depression.   DRUG ALLERGIES:  No Known Allergies  VITALS:  Blood pressure 126/65, pulse 99, temperature 97.7 F (36.5 C), temperature source Oral, resp. rate 18, height 5\' 3"  (1.6 m), weight 81.9 kg (180 lb 9.6 oz), SpO2 92 %.  PHYSICAL EXAMINATION:  GENERAL:  66 y.o.-year-old patient lying in the bed with no acute distress.  EYES: Pupils equal, round, reactive to light and accommodation. No scleral icterus. Extraocular muscles intact.  HEENT: Head atraumatic, normocephalic. Oropharynx and nasopharynx clear.  NECK:  Supple, no jugular venous distention. No thyroid enlargement, no tenderness.  LUNGS: Mod breath sounds bilaterally, no wheezing, rales,rhonchi . Bilateral crackles are present No use of accessory muscles of respiration.  CARDIOVASCULAR: S1, S2 normal. No murmurs, rubs, or gallops.  ABDOMEN: Soft, nontender, nondistended. Bowel sounds present. No organomegaly or mass.  EXTREMITIES: No pedal edema, cyanosis, or clubbing.  NEUROLOGIC: Cranial nerves II  through XII are intact. Muscle strength 5/5 in all extremities. Sensation intact. Gait not checked.  PSYCHIATRIC: The patient is alert and oriented x 3.  SKIN: No obvious rash, lesion, or ulcer.    LABORATORY PANEL:   CBC  Recent Labs Lab 12/19/16 0640  WBC 10.9  HGB 11.0*  HCT 31.9*  PLT 266   ------------------------------------------------------------------------------------------------------------------  Chemistries   Recent Labs Lab 12/17/16 1545 12/19/16 0640  NA 130* 134*  K 3.7 4.0  CL 96* 98*  CO2 21* 25  GLUCOSE 152* 113*  BUN 24* 17  CREATININE 1.09* 0.76  CALCIUM 8.6* 8.8*  AST 25  --   ALT 15  --   ALKPHOS 84  --   BILITOT 0.5  --    ------------------------------------------------------------------------------------------------------------------  Cardiac Enzymes  Recent Labs Lab 12/16/16 1033  TROPONINI <0.03   ------------------------------------------------------------------------------------------------------------------  RADIOLOGY:  Dg Chest 1 View  Result Date: 12/20/2016 CLINICAL DATA:  Pneumonia.  Shortness of breath. EXAM: CHEST 1 VIEW COMPARISON:  12/17/2016. FINDINGS: Cardiomegaly with mild pulmonary vascular prominence. Bilateral interstitial prominence is again noted most likely related chronic interstitial lung disease. Superimposed interstitial edema and/or pneumonitis cannot be excluded. No pleural effusion or pneumothorax. IMPRESSION: Cardiomegaly with mild pulmonary vascular prominence. Bilateral chronic nterstitial disease noted. Superimposed mild interstitial edema and/or pneumonitis cannot be excluded . Electronically Signed   By: Marcello Moores  Register   On: 12/20/2016 10:33    EKG:   Orders placed or performed in visit on 12/16/16  . EKG 12-Lead  . EKG 12-Lead  . EKG 12-Lead    ASSESSMENT AND PLAN:   66 year old female with idiopathic pulmonary fibrosis who presents with acute hypoxic respiratory failure due to worsening  of underlying pulmonary fibrosis.  1. Acute hypoxic respiratory failure in the setting of progressive pulmonary fibrosis with Possible  superimposed interstitial pneumonia: Clinically Slow improvement  Weaned off to 6-10 lit of  [place] from high flow oxygen- 50 % FIo2 I will continue her on nebulizer therapy every 6 hours Changed  IV Solu-Medrol to po  prednisone 40 mg until seen by dr.Simonds before Piferidone approval given empiric IV antibiotics Zosyn and  Vancomycin which are d/ced continue high flow oxygen Appreciate pulmonology recommendations  Patient has an appointment to follow-up at [facility] transplant team in February   2. Hypothyroidism: Continue Synthroid  3. Depression: Continue Zoloft  4. Hyperlipidemia: Continue atorvastatin   5. dvt px : lovenox  Pt was seen by palliative care- home with hospice care approved  Generalized weakness -PT- HHPT recommended  F/u with CM for home o2 arrangement  to go home   All the records are reviewed and case discussed with Care Management/Social Workerr. Management plans discussed with the patient, family and they are in agreement.  CODE STATUS: partial code,husband and daughter HCPOA  TOTAL TIME TAKING CARE OF THIS PATIENT: 36  minutes.   POSSIBLE D/C IN 1-2 DAYS, DEPENDING ON CLINICAL CONDITION.  Note: This dictation was prepared with Dragon dictation along with smaller phrase technology. Any transcriptional errors that result from this process are unintentional.   Nicholes Mango M.D on 12/21/2016 at 5:43 PM  Between 7am to 6pm - Pager - [phone] After 6pm go to [email or web address] - password [email or web address] [facility]  [facility]  Office  [phone]  CC: Primary care physician; Tommi Rumps, MD

## 2016-12-21 NOTE — Progress Notes (Signed)
New referral for [facility] services at home following discharge received from [facility]. Mrs. Roes is a 67 year old woman with a known history of progressive pulmonary fibrosis admitted to [facility] on 1/12 for management of acute respiratory failure, she was hospitalized in December 2017 for the same. CT of the chest showed progression of her pulmonary fibrosis and she was admitted for administration of broad spectrum antibiotics. She has  required hi flo oxygen this admission and is currently on 6 liters, increased to 10 liters when up to the bed side commode. Patient and her family have met with Palliative Medicine NP Ihor Dow and would like to go home with the support of hospice services. Writer met in the room with patient, her husband Shanon Brow and daughter Jenny Reichmann to initiate education regarding hospice services, philosophy and team approach to care with good understanding voiced. DME will need to be in lace prior to discharge. Patient has requested BSC, nebulizer machine and will require 2 oxygen concentrators to deliver adequate oxygen at home. Patient and her family are aware of difficulty with delivery due to the weather. Writer to follow up in the morning and continue to work with the family and the hospital care team to plan for a safe discharge. Patient information faxed to hospice referral. Thank you. Flo Shanks Rn, BSN, [place], [facility] [phone] c

## 2016-12-22 ENCOUNTER — Inpatient Hospital Stay: Payer: Medicare Other

## 2016-12-22 ENCOUNTER — Telehealth: Payer: Self-pay | Admitting: Internal Medicine

## 2016-12-22 LAB — CBC
HEMATOCRIT: 32 % — AB (ref 35.0–47.0)
Hemoglobin: 10.9 g/dL — ABNORMAL LOW (ref 12.0–16.0)
MCH: 31.7 pg (ref 26.0–34.0)
MCHC: 34.2 g/dL (ref 32.0–36.0)
MCV: 92.9 fL (ref 80.0–100.0)
PLATELETS: 274 10*3/uL (ref 150–440)
RBC: 3.45 MIL/uL — ABNORMAL LOW (ref 3.80–5.20)
RDW: 14.6 % — ABNORMAL HIGH (ref 11.5–14.5)
WBC: 10.6 10*3/uL (ref 3.6–11.0)

## 2016-12-22 LAB — COMPREHENSIVE METABOLIC PANEL
ALBUMIN: 2.7 g/dL — AB (ref 3.5–5.0)
ALT: 22 U/L (ref 14–54)
AST: 24 U/L (ref 15–41)
Alkaline Phosphatase: 73 U/L (ref 38–126)
Anion gap: 8 (ref 5–15)
BILIRUBIN TOTAL: 0.3 mg/dL (ref 0.3–1.2)
BUN: 18 mg/dL (ref 6–20)
CHLORIDE: 102 mmol/L (ref 101–111)
CO2: 26 mmol/L (ref 22–32)
Calcium: 7.6 mg/dL — ABNORMAL LOW (ref 8.9–10.3)
Creatinine, Ser: 0.85 mg/dL (ref 0.44–1.00)
GFR calc Af Amer: >60 mL/min (ref >60)
Glucose, Bld: 103 mg/dL — ABNORMAL HIGH (ref 65–99)
POTASSIUM: 4 mmol/L (ref 3.5–5.1)
Sodium: 136 mmol/L (ref 135–145)
TOTAL PROTEIN: 5.9 g/dL — AB (ref 6.5–8.1)

## 2016-12-22 LAB — CREATININE, SERUM: CREATININE: 0.74 mg/dL (ref 0.44–1.00)

## 2016-12-22 LAB — DIFFERENTIAL
BASOS ABS: 0 10*3/uL (ref 0–0.1)
BLASTS: 0 %
Band Neutrophils: 3 %
Basophils Relative: 0 %
EOS PCT: 2 %
Eosinophils Absolute: 0.2 10*3/uL (ref 0–0.7)
Lymphocytes Relative: 11 %
Lymphs Abs: 1.2 10*3/uL (ref 1.0–3.6)
MONOS PCT: 3 %
Metamyelocytes Relative: 0 %
Monocytes Absolute: 0.3 10*3/uL (ref 0.2–0.9)
Myelocytes: 0 %
NEUTROS PCT: 80 %
Neutro Abs: 8.9 10*3/uL — ABNORMAL HIGH (ref 1.4–6.5)
Other: 0 %
Promyelocytes Absolute: 1 %
nRBC: 0 /100 WBC

## 2016-12-22 LAB — URINALYSIS, COMPLETE (UACMP) WITH MICROSCOPIC
Bacteria, UA: NONE SEEN
Bilirubin Urine: NEGATIVE
GLUCOSE, UA: NEGATIVE mg/dL
HGB URINE DIPSTICK: NEGATIVE
KETONES UR: NEGATIVE mg/dL
Leukocytes, UA: NEGATIVE
NITRITE: NEGATIVE
PH: 6 (ref 5.0–8.0)
Protein, ur: NEGATIVE mg/dL
SPECIFIC GRAVITY, URINE: 1.036 — AB (ref 1.005–1.030)

## 2016-12-22 LAB — GLUCOSE, CAPILLARY
GLUCOSE-CAPILLARY: 103 mg/dL — AB (ref 65–99)
GLUCOSE-CAPILLARY: 143 mg/dL — AB (ref 65–99)
Glucose-Capillary: 100 mg/dL — ABNORMAL HIGH (ref 65–99)
Glucose-Capillary: 148 mg/dL — ABNORMAL HIGH (ref 65–99)

## 2016-12-22 LAB — LACTIC ACID, PLASMA
LACTIC ACID, VENOUS: 0.8 mmol/L (ref 0.5–1.9)
Lactic Acid, Venous: 0.7 mmol/L (ref 0.5–1.9)

## 2016-12-22 LAB — PROCALCITONIN: PROCALCITONIN: 1.06 ng/mL

## 2016-12-22 MED ORDER — PROMETHAZINE HCL 25 MG/ML IJ SOLN
25.0000 mg | Freq: Four times a day (QID) | INTRAMUSCULAR | Status: DC | PRN
  Administered 2016-12-22: 25 mg via INTRAVENOUS
  Filled 2016-12-22 (×2): qty 1

## 2016-12-22 MED ORDER — ONDANSETRON HCL 4 MG/2ML IJ SOLN
4.0000 mg | Freq: Once | INTRAMUSCULAR | Status: AC
  Administered 2016-12-22: 4 mg via INTRAVENOUS
  Filled 2016-12-22: qty 2

## 2016-12-22 MED ORDER — PANTOPRAZOLE SODIUM 40 MG IV SOLR
40.0000 mg | Freq: Two times a day (BID) | INTRAVENOUS | Status: DC
  Administered 2016-12-22 – 2016-12-23 (×3): 40 mg via INTRAVENOUS
  Filled 2016-12-22 (×3): qty 40

## 2016-12-22 MED ORDER — KETOROLAC TROMETHAMINE 15 MG/ML IJ SOLN
15.0000 mg | Freq: Once | INTRAMUSCULAR | Status: AC
  Administered 2016-12-22: 15 mg via INTRAVENOUS
  Filled 2016-12-22: qty 1

## 2016-12-22 MED ORDER — VANCOMYCIN HCL IN DEXTROSE 1-5 GM/200ML-% IV SOLN
1000.0000 mg | Freq: Once | INTRAVENOUS | Status: DC
  Filled 2016-12-22 (×2): qty 200

## 2016-12-22 MED ORDER — VANCOMYCIN HCL IN DEXTROSE 1-5 GM/200ML-% IV SOLN
1000.0000 mg | Freq: Two times a day (BID) | INTRAVENOUS | Status: DC
  Filled 2016-12-22 (×2): qty 200

## 2016-12-22 MED ORDER — SODIUM CHLORIDE 0.9 % IV SOLN
INTRAVENOUS | Status: DC
  Administered 2016-12-22: 12:00:00 via INTRAVENOUS

## 2016-12-22 MED ORDER — VANCOMYCIN HCL IN DEXTROSE 1-5 GM/200ML-% IV SOLN
1000.0000 mg | INTRAVENOUS | Status: AC
  Administered 2016-12-22: 1000 mg via INTRAVENOUS
  Filled 2016-12-22: qty 200

## 2016-12-22 MED ORDER — DEXTROSE 5 % IV SOLN
2.0000 g | Freq: Two times a day (BID) | INTRAVENOUS | Status: DC
  Administered 2016-12-22 – 2016-12-26 (×9): 2 g via INTRAVENOUS
  Filled 2016-12-22 (×13): qty 2

## 2016-12-22 MED ORDER — PROMETHAZINE HCL 25 MG/ML IJ SOLN
12.5000 mg | Freq: Four times a day (QID) | INTRAMUSCULAR | Status: DC | PRN
  Administered 2016-12-22 – 2016-12-25 (×6): 12.5 mg via INTRAVENOUS
  Filled 2016-12-22 (×5): qty 1

## 2016-12-22 MED ORDER — IOPAMIDOL (ISOVUE-300) INJECTION 61%
100.0000 mL | Freq: Once | INTRAVENOUS | Status: AC | PRN
  Administered 2016-12-22: 100 mL via INTRAVENOUS

## 2016-12-22 MED ORDER — VANCOMYCIN HCL IN DEXTROSE 750-5 MG/150ML-% IV SOLN
750.0000 mg | Freq: Two times a day (BID) | INTRAVENOUS | Status: DC
  Administered 2016-12-22 – 2016-12-23 (×3): 750 mg via INTRAVENOUS
  Filled 2016-12-22 (×5): qty 150

## 2016-12-22 NOTE — Progress Notes (Signed)
Temp increased to 100.2 oral, tylenol 650 mg given po

## 2016-12-22 NOTE — Progress Notes (Addendum)
PATIENT NAME: Kelli Morris    MR#:  [id number]  DATE OF BIRTH:  [birth year]  HPI: This is 65 yo female who presented to [facility] ER 01/12 with acute on chronic hypoxic respiratory failure secondary to acute exacerbation of IPF and possible superimposed interstitial pneumonia admitted to telemetry unit.  Pt developed worsening acute on chronic hypoxic respiratory failure requiring 60% HFNC on 01/18 and new onset nausea, vomiting, and abdominal pain on 01/17.  Therefore, pt transferred to [facility] for additional management 01/18 and PCCM reconsulted.  REVIEW OF SYSTEMS: Positives in BOLD Gen: fever, chills, weight change, fatigue, night sweats HEENT: Denies blurred vision, double vision, hearing loss, tinnitus, sinus congestion, rhinorrhea, sore throat, neck stiffness, dysphagia PULM: shortness of breath, cough, sputum production, hemoptysis, wheezing CV: Denies chest pain, edema, orthopnea, paroxysmal nocturnal dyspnea, palpitations GI: abdominal pain, nausea, vomiting, diarrhea, hematochezia, melena, constipation, change in bowel habits GU: Denies dysuria, hematuria, polyuria, oliguria, urethral discharge Endocrine: Denies hot or cold intolerance, polyuria, polyphagia or appetite change Derm: Denies rash, dry skin, scaling or peeling skin change Heme: Denies easy bruising, bleeding, bleeding gums Neuro: Denies headache, numbness, weakness, slurred speech, loss of memory or consciousness  SUBJECTIVE:  Pt c/o abdominal pain states nausea has subsided.  She also endorses having fevers and chills she currently denies shortness of breath on 60% HFNC.  Vitals:   12/22/16 1156 12/22/16 1158 12/22/16 1200 12/22/16 1224  BP:  (!) 112/48    Pulse:  (!) 133    Resp:      Temp:   100.2 F (37.9 C) (!) 100.8 F (38.2 C)  TempSrc:   Oral   SpO2: (!) 86% 91% 94%   Weight:      Height:       PHYSICAL EXAMINATION: General: chronically ill appearing Caucasian female Neuro: alert and oriented,  follows commands  HEENT: supple, no JVD Cardiovascular: sinus tach, s1s2, no M/R/G Lungs: diminished throughout, even, non labored on HFNC MUSCULOSKELETAL: normal bulk and tone, no edema Skin: no rashes or lesions present    BMP Latest Ref Rng & Units 12/22/2016 12/19/2016 12/17/2016  Glucose 65 - 99 mg/dL - 113(H) 152(H)  BUN 6 - 20 mg/dL - 17 24(H)  Creatinine 0.44 - 1.00 mg/dL 0.74 0.76 1.09(H)  BUN/Creat Ratio 11 - 26 - - -  Sodium 135 - 145 mmol/L - 134(L) 130(L)  Potassium 3.5 - 5.1 mmol/L - 4.0 3.7  Chloride 101 - 111 mmol/L - 98(L) 96(L)  CO2 22 - 32 mmol/L - 25 21(L)  Calcium 8.9 - 10.3 mg/dL - 8.8(L) 8.6(L)     CBC Latest Ref Rng & Units 12/19/2016 12/17/2016 12/16/2016  WBC 3.6 - 11.0 K/uL 10.9 7.6 12.3(H)  Hemoglobin 12.0 - 16.0 g/dL 11.0(L) 11.1(L) 11.4(L)  Hematocrit 35.0 - 47.0 % 31.9(L) 32.1(L) 33.3(L)  Platelets 150 - 440 K/uL 266 229 235    IMPRESSION: Acute on chronic hypoxic respiratory failure secondary to pneumonitis and IPF Advanced IPF with rapidly progressive course over past several months Nausea/Vomiting Abdominal Pain Former smoker - did not previously   PLAN/REC: Continue HFNC to maintain O2 sats >90% wean as tolerated  Will start Vancomycin and Cefepime  Trend WBC's and monitor fever curve  Trend PCT's and Lactic acid's Will obtain pan culture Continue bronchodilator therapy  Continue po prednisone  Continue chest physiotherapy q4hrs  Pulmonary hygiene  Stop IV fluids  Stat CT abd pelvis today Repeat CBC and BMP today  CXR in am   Marda Stalker, [place]  Pulmonary/Critical Care Pager [phone] (please enter 7 digits) PCCM Consult Pager [phone] (please enter 7 digits)  Pt seen and examined with NP, above note reflects my findings, assessment and plan.  Known severe IPF with exacerbation and rapidly declining course over past month. Was planning to go home with hospice following, but wanted to start pirfenidone. However over past day  has become more dyspneic. Review of CXr image shows worsening RLL atelectasis/pneumonia. Will start broad spectrum abx.  Pt code status is DNR.   Marda Stalker, M.D.  12/22/2016  Critical Care Attestation.  I have personally obtained a history, examined the patient, evaluated laboratory and imaging results, formulated the assessment and plan and placed orders. The Patient requires high complexity decision making for assessment and support, frequent evaluation and titration of therapies, application of advanced monitoring technologies and extensive interpretation of multiple databases. The patient has critical illness that could lead imminently to failure of 1 or more organ systems and requires the highest level of physician preparedness to intervene.  Critical Care Time devoted to patient care services described in this note is 45 minutes and is exclusive of time spent in procedures.

## 2016-12-22 NOTE — Telephone Encounter (Signed)
Oleta Mouse from Hospice called.  Ms Boeker being discharged from [facility].  Needs order for Hospice.  Verbal order given.  Written forms to be sent to office to sign.

## 2016-12-22 NOTE — Progress Notes (Addendum)
Pharmacy Antibiotic Note  Kelli Morris is a 66 y.o. female admitted on 12/16/2016 with pneumonia.  Pharmacy has been consulted for cefepime and vancomcyin dosing.  Plan: Cefepime 2g IV Q12hr.    Vancomycin 1000mg  IV x 1. Will start vancomycin 750 mg IV Q12hr for goal trough of 15-20 mcg/ml.   Height: 5\' 3"  (160 cm) Weight: 180 lb 9.6 oz (81.9 kg) IBW/kg (Calculated) : 52.4  Temp (24hrs), Avg:99.6 F (37.6 C), Min:98 F (36.7 C), Max:100.8 F (38.2 C)   Recent Labs Lab 12/16/16 1033 12/16/16 1308 12/17/16 0521 12/17/16 1545 12/19/16 0640 12/22/16 0647 12/22/16 1304  WBC 12.3*  --  7.6  --  10.9  --  10.6  CREATININE 0.91  --  0.86 1.09* 0.76 0.74 0.85  LATICACIDVEN  --  1.0  --   --   --   --  0.8    Estimated Creatinine Clearance: 66.9 mL/min (by C-G formula based on SCr of 0.85 mg/dL).    No Known Allergies  Antimicrobials this admission: Cefepime 1/18 >>  Vancomycin 1/12 >>1/13, 1/18 >>  Zosyn 1/12 >> 1/16    Microbiology results: 1/18 BCx: pending  1/12 Sputum: normal flora  1/12 MRSA PCR: negative   Pharmacy will continue to monitor and adjust per consult.    Ulice Dash, PharmD Clinical Pharmacist

## 2016-12-22 NOTE — Progress Notes (Signed)
RT called to room for rapid response.  Upon arrival to room O2 sat 91% on 50% HFNC at 50LPM.  FiO2 increased to 60%, O2 sats up to 94%.  HR remains elevated at 130, Temp now 101.2.  RN [name] in room.  Will continue to monitor.

## 2016-12-22 NOTE — Progress Notes (Signed)
Follow up visit to new referral for [facility] at home following discharge. Patient had nausea vomiting overnight and also a decline in her respiratory status this afternoon along with an elevated temperature, she has been transferred to the ICU and is currently back on [place] oxygen.  Patient seen sitting up in bed, alert and oriented husband and daughter at bedside. Support given. Plan remains for patient to return home with hospice services at discharge. All DME will need to be in place prior to discharge. Patient will require EMS transport at discharge.  Hospice information folder and contact number given to family. Will continue to follow through final disposition Flo Shanks RN, BSN, [facility] and [facility], hospital liaison [phone] c

## 2016-12-22 NOTE — Progress Notes (Signed)
[facility] [place] NAME: Pressley Valenzano    MR#:  [id number]  DATE OF BIRTH:  [birth year]  SUBJECTIVE:  CHIEF COMPLAINT:  Patient's shortness of breath is Worse today needing high flow oxygen. Vomiting. Reporting abdominal discomfort and had rapid response , chest x-ray with a worsening of groundglass opacities edema  REVIEW OF SYSTEMS:  CONSTITUTIONAL: No fever, fatigue or weakness.  EYES: No blurred or double vision.  EARS, NOSE, AND THROAT: No tinnitus or ear pain.  RESPIRATORY: Worsening shortness of breath , denies wheezing or hemoptysis.  CARDIOVASCULAR: No chest pain, orthopnea, edema.  GASTROINTESTINAL: Reporting nausea vomiting and lower abdominal pain  GENITOURINARY: No dysuria, hematuria.  ENDOCRINE: No polyuria, nocturia,  HEMATOLOGY: No anemia, easy bruising or bleeding SKIN: No rash or lesion. MUSCULOSKELETAL: No joint pain or arthritis.   NEUROLOGIC: No tingling, numbness, weakness.  PSYCHIATRY: No anxiety or depression.   DRUG ALLERGIES:  No Known Allergies  VITALS:  Blood pressure 109/70, pulse (!) 103, temperature 98.9 F (37.2 C), temperature source Axillary, resp. rate (!) 24, height 5\' 3"  (1.6 m), weight 83.2 kg (183 lb 6.8 oz), SpO2 94 %.  PHYSICAL EXAMINATION:  GENERAL:  66 y.o.-year-old patient lying in the bed with no acute distress.  EYES: Pupils equal, round, reactive to light and accommodation. No scleral icterus. Extraocular muscles intact.  HEENT: Head atraumatic, normocephalic. Oropharynx and nasopharynx clear.  NECK:  Supple, no jugular venous distention. No thyroid enlargement, no tenderness.  LUNGS: Diminished breath sounds bilaterally with rales and rhonchi no wheezing No use of accessory muscles of respiration.  CARDIOVASCULAR: S1, S2 normal. No murmurs, rubs, or gallops.  ABDOMEN: Diffusely tender in the lower abdominal area. No rebound tenderness. Hypoactive bowel sounds, distended. No  organomegaly or mass.  EXTREMITIES: No pedal edema, cyanosis, or clubbing.  NEUROLOGIC: Cranial nerves II through XII are intact. Muscle strength 5/5 in all extremities. Sensation intact. Gait not checked.  PSYCHIATRIC: The patient is alert and oriented x 3.  SKIN: No obvious rash, lesion, or ulcer.    LABORATORY PANEL:   CBC  Recent Labs Lab 12/22/16 1304  WBC 10.6  HGB 10.9*  HCT 32.0*  PLT 274   ------------------------------------------------------------------------------------------------------------------  Chemistries   Recent Labs Lab 12/22/16 1304  NA 136  K 4.0  CL 102  CO2 26  GLUCOSE 103*  BUN 18  CREATININE 0.85  CALCIUM 7.6*  AST 24  ALT 22  ALKPHOS 73  BILITOT 0.3   ------------------------------------------------------------------------------------------------------------------  Cardiac Enzymes  Recent Labs Lab 12/16/16 1033  TROPONINI <0.03   ------------------------------------------------------------------------------------------------------------------  RADIOLOGY:  Dg Abd 1 View  Result Date: 12/22/2016 CLINICAL DATA:  66 y/o  F; nausea and vomiting starting today. EXAM: ABDOMEN - 1 VIEW COMPARISON:  None. FINDINGS: The bowel gas pattern is within normal limits. No radio-opaque calculi seen. Chronic interstitial disease of the lung bases noted. IMPRESSION: Negative. Electronically Signed   By: Kristine Garbe M.D.   On: 12/22/2016 06:24   Ct Abdomen Pelvis W Contrast  Result Date: 12/22/2016 CLINICAL DATA:  Periumbilical pain. Nausea, vomiting and diarrhea since yesterday. History of hysterectomy and uterine cancer. EXAM: CT ABDOMEN AND PELVIS WITH CONTRAST TECHNIQUE: Multidetector CT imaging of the abdomen and pelvis was performed using the standard protocol following bolus administration of intravenous contrast. CONTRAST:  153mL ISOVUE-300 IOPAMIDOL (ISOVUE-300) INJECTION 61% COMPARISON:  Chest CT 01/26/2016 FINDINGS: Lower chest:  Right lower lobe unchanged 10.4 mm in average pulmonary nodule. Bold scattered areas  of interstitial fibrosis with superimposed ground-glass opacities are again seen within both visualized lower lobes. Mild lower lobe bronchiectasis as before. Cardiomegaly with coronary arteriosclerosis. No pericardial effusion. Hepatobiliary: No focal liver abnormality is seen. No gallstones, gallbladder wall thickening, or biliary dilatation. Pancreas: Unremarkable. No pancreatic ductal dilatation or surrounding inflammatory changes. Spleen: Normal in size without focal abnormality. Adrenals/Urinary Tract: Normal bilateral adrenal glands. No obstructive uropathy. Bilateral renal cysts are noted the largest in the lower pole of the right kidney measuring approximately 3.1 x 2.6 cm. Urinary bladder is unremarkable. Stomach/Bowel: Nondistended stomach. Normal position of ligament of Treitz and small bowel rotation. Mild fluid-filled distention of small bowel loops without bowel obstruction. Findings may represent an enteritis. Moderate colonic stool burden throughout large bowel to the level of the splenic flexure. Colonic diverticulosis noted along the distal descending and sigmoid colon without acute inflammation. Normal-appearing appendix. Vascular/Lymphatic: Aortic atherosclerosis without aneurysm or dissection. No lymphadenopathy. Splenic and portal veins are patent. Celiac axis, SMA and both renal arteries are patent. Calcifications at the origins of both renal arteries. Atherosclerotic common iliac arteries and their branches. Reproductive: Hysterectomy.  No adnexal mass. Other: Tiny fat containing umbilical hernia. Musculoskeletal: Mild disc space narrowing T7 through T12 and at L4-5. No acute osseous abnormality. IMPRESSION: 1. Chronic interstitial fibrosis and ground-glass opacities at the lung bases consistent with UIP. Stable right lower lobe pulmonary nodule averaging 10.4 mm. 2. Mild fluid-filled distention of small  bowel may represent an enteritis. No bowel obstruction. 3. Small fat containing umbilical hernia. 4. Bilateral renal cysts. 5. Aortic and branch vessel atherosclerosis. Electronically Signed   By: Ashley Royalty M.D.   On: 12/22/2016 15:13   Dg Chest Port 1 View  Result Date: 12/22/2016 CLINICAL DATA:  Acute respiratory failure EXAM: PORTABLE CHEST 1 VIEW COMPARISON:  12/20/2016 FINDINGS: Cardiomegaly again noted. There is reticular interstitial prominence bilaterally with worsening from prior exam. Findings highly suspicious for edema or pneumonitis superimposed on chronic interstitial lung disease. Peripheral fibrotic changes again noted. IMPRESSION: There is reticular interstitial prominence bilaterally with worsening from prior exam. Findings highly suspicious for edema or pneumonitis superimposed on chronic interstitial lung disease. Peripheral fibrotic changes again noted. Electronically Signed   By: Lahoma Crocker M.D.   On: 12/22/2016 12:27    EKG:   Orders placed or performed in visit on 12/16/16  . EKG 12-Lead  . EKG 12-Lead  . 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  1. Acute hypoxic respiratory failure-Worsening today in the setting of progressive pulmonary fibrosis with Possible  superimposed interstitial pneumonia: Patient is transferred to intensive care unit  --High flow Oxygen  -Patient is restarted on antibiotics cefepime and vancomycin -Pancultures -Continue by mouth prednisone and chest physical therapy Follow up with pulmonology Appreciate pulmonology recommendations  Patient has an appointment to follow-up at [facility] transplant team in February  2. Acute lower abd pain  CT abdomen probably colitis GI consult Will check stool for GI panel and C. difficile colitis   #. Hypothyroidism: Continue Synthroid  #.  Depression: Continue Zoloft  #. Hyperlipidemia: Continue atorvastatin   #. dvt px : lovenox  Pt was seen by palliative care- home with hospice care approved  Generalized weakness -PT- HHPT recommended  F/u with CM for home o2 arrangement  to go home and patient is medically stable  Very poor prognosis High risk for  acute pulmonary failure   All the records are reviewed and case discussed with Care Management/Social Workerr. Management plans discussed with the patient, family and they are in agreement.  CODE STATUS: partial code,husband and daughter HCPOA  TOTAL critical care TIME TAKING CARE OF THIS PATIENT: 41  minutes.   POSSIBLE D/C IN ? DAYS, DEPENDING ON CLINICAL CONDITION.  Note: This dictation was prepared with Dragon dictation along with smaller phrase technology. Any transcriptional errors that result from this process are unintentional.   Nicholes Mango M.D on 12/22/2016 at 5:07 PM  Between 7am to 6pm - Pager - [phone] After 6pm go to [email or web address] - password [email or web address] [facility]  [facility] [facility]  Office  [phone]  CC: Primary care physician; Tommi Rumps, MD

## 2016-12-22 NOTE — Care Management (Signed)
Per previous RNCM note patient has received a referral to Santiago Glad with [facility] hospice. Santiago Glad is aware and working with patient.

## 2016-12-22 NOTE — Progress Notes (Signed)
RT called to room by [name], RN due to low sats.  Upon arrival to room, patient's O2 sat 82% on 10LPM [facility].  Patient placed on 50% HFNC at 50LPM, O2 sats up to 92%

## 2016-12-22 NOTE — Care Management CHF Note (Signed)
Patient having gi issues during the night.  She required transfer to icu after rapid response and placed back on high flow nasal cannula.

## 2016-12-22 NOTE — Progress Notes (Signed)
Visited with Pt. And family during rapid response.  Provided emotional support regarding transfer to ICU. Several family members present and providing care.  Will follow-up with patient and family as needed.

## 2016-12-22 NOTE — Progress Notes (Signed)
RT called to room for transport of patient from 247 to ICU 4.  Patient placed on 100% NRB with sats of 95% for transport without complication.  Upon arrival to [facility], patient left on NRB for immediate transport to CT.  HFNC setup at bedside 60% at 50LPM.  Will continue to monitor.

## 2016-12-22 NOTE — Progress Notes (Signed)
O2 sats below 90% on [place] 10 lo.  Resp called.  Start high flor at 50%.

## 2016-12-22 NOTE — Progress Notes (Addendum)
Patient requesting more pain medication for headache after receiving Norco 5-325 mg. Patient had one episode of vomiting and believes that she may have vomited the pill. MD Mody notified. Verbal order given to give one time dose of Toradol, and allow pharmacy to dose it. Pharmacist, Nate, consulted about dosage. Pharmacist placed and verified order. This Rn to administer as ordered. Will continue to monitor.    Updated 0430: Patient started having nausea and vomiting. 500 cc of brown emesis noted. Patient given 4 mg Zofran at 0200. Patient states that it helped a little but then the nausea came back shortly after receiving the Zofran. Another 500 cc of brown emesis noted. MD Mody notified. Verbal order given for another 4 mg of Zofran. Will continue to monitor.   Update 0600: Patient still complaining of nausea, vomiting and abdominal pain despite receiving a total of 8 mg of Zofran. MD Mody notified. Verbal order given for KUB, and PRN order for Phenergan . This RN to place orders, and administer as ordered.     Iran Sizer M

## 2016-12-22 NOTE — Progress Notes (Signed)
Resting at this time.  Nausea improved with phenergan.  NSR per cardiac monitor.  No vomiting.  Husband and daughter at bedside since transfer from 2A.

## 2016-12-22 NOTE — Progress Notes (Signed)
Sats dropping to the mid-80's.  Repositioned patient for better lung inflation.  No improvement.  Called Rapid Response.  High flow increased to 60% sats improved to 92%.

## 2016-12-23 ENCOUNTER — Inpatient Hospital Stay (HOSPITAL_COMMUNITY)
Admit: 2016-12-23 | Discharge: 2016-12-23 | Disposition: A | Discharge status: Home / Self Care | Payer: Medicare Other | Attending: Critical Care Medicine | Admitting: Critical Care Medicine

## 2016-12-23 ENCOUNTER — Telehealth: Payer: Self-pay | Admitting: *Deleted

## 2016-12-23 ENCOUNTER — Inpatient Hospital Stay: Payer: Medicare Other

## 2016-12-23 DIAGNOSIS — J80 Acute respiratory distress syndrome: Secondary | ICD-10-CM

## 2016-12-23 LAB — BASIC METABOLIC PANEL
ANION GAP: 10 (ref 5–15)
BUN: 17 mg/dL (ref 6–20)
CO2: 22 mmol/L (ref 22–32)
Calcium: 6.9 mg/dL — ABNORMAL LOW (ref 8.9–10.3)
Chloride: 103 mmol/L (ref 101–111)
Creatinine, Ser: 0.75 mg/dL (ref 0.44–1.00)
GFR calc Af Amer: >60 mL/min (ref >60)
GLUCOSE: 94 mg/dL (ref 65–99)
POTASSIUM: 3.7 mmol/L (ref 3.5–5.1)
Sodium: 135 mmol/L (ref 135–145)

## 2016-12-23 LAB — GLUCOSE, CAPILLARY
GLUCOSE-CAPILLARY: 84 mg/dL (ref 65–99)
GLUCOSE-CAPILLARY: 123 mg/dL — AB (ref 65–99)
Glucose-Capillary: 140 mg/dL — ABNORMAL HIGH (ref 65–99)
Glucose-Capillary: 104 mg/dL — ABNORMAL HIGH (ref 65–99)

## 2016-12-23 LAB — CBC
HEMATOCRIT: 30.8 % — AB (ref 35.0–47.0)
HEMOGLOBIN: 10.4 g/dL — AB (ref 12.0–16.0)
MCH: 31.6 pg (ref 26.0–34.0)
MCHC: 33.9 g/dL (ref 32.0–36.0)
MCV: 93.3 fL (ref 80.0–100.0)
Platelets: 268 10*3/uL (ref 150–440)
RBC: 3.3 MIL/uL — ABNORMAL LOW (ref 3.80–5.20)
RDW: 14.9 % — ABNORMAL HIGH (ref 11.5–14.5)
WBC: 9 10*3/uL (ref 3.6–11.0)

## 2016-12-23 LAB — CREATININE, SERUM
Creatinine, Ser: 0.74 mg/dL (ref 0.44–1.00)
GFR calc Af Amer: >60 mL/min (ref >60)
GFR calc non Af Amer: >60 mL/min (ref >60)

## 2016-12-23 LAB — PROCALCITONIN: Procalcitonin: 0.45 ng/mL

## 2016-12-23 MED ORDER — FUROSEMIDE 10 MG/ML IJ SOLN
40.0000 mg | Freq: Once | INTRAMUSCULAR | Status: AC
  Administered 2016-12-23: 40 mg via INTRAVENOUS
  Filled 2016-12-23: qty 4

## 2016-12-23 MED ORDER — PANTOPRAZOLE SODIUM 40 MG PO TBEC
40.0000 mg | DELAYED_RELEASE_TABLET | Freq: Two times a day (BID) | ORAL | Status: DC
  Administered 2016-12-23 – 2016-12-26 (×6): 40 mg via ORAL
  Filled 2016-12-23 (×6): qty 1

## 2016-12-23 NOTE — Progress Notes (Signed)
PHARMACIST - PHYSICIAN COMMUNICATION  DR:   Margaretmary Eddy  CONCERNING: IV to Oral Route Change Policy  RECOMMENDATION: This patient is receiving pantoprazole by the intravenous route.  Based on criteria approved by the Pharmacy and Therapeutics Committee, the intravenous medication is being converted to the equivalent oral dose form pantoprazole 40mg  PO BID.   DESCRIPTION: These criteria include:  The patient is eating (either orally or via tube) and/or has been taking other orally administered medications for a least 24 hours  The patient has no evidence of active gastrointestinal bleeding or impaired GI absorption (gastrectomy, short bowel, patient on TNA or NPO).  If you have questions about this conversion, please contact the Pharmacy Department  []   [phone] )  [facility] [x]   [phone] )  [facility] []   [phone] )  [facility] []   [phone] )  [facility] []   [phone] )  [place], [facility] 12/23/2016 1:22 PM

## 2016-12-23 NOTE — Telephone Encounter (Signed)
-----   Message from Laverle Hobby, MD sent at 12/21/2016  1:11 PM EST ----- Regarding: hfu Pt needs follow up with Dr. Alva Garnet in 2-4 weeks. Care management initiated pre-auth for pirfenidone.

## 2016-12-23 NOTE — Progress Notes (Signed)
Follow up visit made to new referral for [facility] [place] services at home following discharge. Patient remains on hi flo, GI consult completed, Echocardiogram ordered. Writer spoke with patient's husband and daughter outside the room, plan remains for patient to return home with hospice services. DME to be delivered tomorrow,DME company needs to be sure the concentrator can be used in the home due to high amp use. No plans for discharge over the weekend. Will continue to follow through final disposition. Thank you. Flo Shanks RN, BSN, [facility] [place], hospital Liaison [phone] c

## 2016-12-23 NOTE — Progress Notes (Signed)
[facility] [place] NAME: Kelli Morris    MR#:  [id number]  DATE OF BIRTH:  [birth year]  SUBJECTIVE:  CHIEF COMPLAINT:  Patient's shortness of breath is better today needing high flow oxygen. VomitingResolved. Reporting intermittent episodes of lower abdominal pain and discomfort  REVIEW OF SYSTEMS:  CONSTITUTIONAL: No fever, fatigue or weakness.  EYES: No blurred or double vision.  EARS, NOSE, AND THROAT: No tinnitus or ear pain.  RESPIRATORY:  shortness of breathWith minimal exertion , denies wheezing or hemoptysis.  CARDIOVASCULAR: No chest pain, orthopnea, edema.  GASTROINTESTINAL: Reporting nausea vomiting and lower abdominal pain  GENITOURINARY: No dysuria, hematuria.  ENDOCRINE: No polyuria, nocturia,  HEMATOLOGY: No anemia, easy bruising or bleeding SKIN: No rash or lesion. MUSCULOSKELETAL: No joint pain or arthritis.   NEUROLOGIC: No tingling, numbness, weakness.  PSYCHIATRY: No anxiety or depression.   DRUG ALLERGIES:  No Known Allergies  VITALS:  Blood pressure 117/72, pulse 98, temperature 98.9 F (37.2 C), resp. rate (!) 23, height 5\' 3"  (1.6 m), weight 83.2 kg (183 lb 6.8 oz), SpO2 97 %.  PHYSICAL EXAMINATION:  GENERAL:  66 y.o.-year-old patient lying in the bed with no acute distress.  EYES: Pupils equal, round, reactive to light and accommodation. No scleral icterus. Extraocular muscles intact.  HEENT: Head atraumatic, normocephalic. Oropharynx and nasopharynx clear.  NECK:  Supple, no jugular venous distention. No thyroid enlargement, no tenderness.  LUNGS: Diminished breath sounds bilaterally with rales and rhonchi no wheezing No use of accessory muscles of respiration.  CARDIOVASCULAR: S1, S2 normal. No murmurs, rubs, or gallops.  ABDOMEN: Diffusely tender in the lower abdominal area. No rebound tenderness. Hypoactive bowel sounds, distended. No organomegaly or mass.  EXTREMITIES: No pedal edema, cyanosis, or  clubbing.  NEUROLOGIC: Cranial nerves II through XII are intact. Muscle strength 5/5 in all extremities. Sensation intact. Gait not checked.  PSYCHIATRIC: The patient is alert and oriented x 3.  SKIN: No obvious rash, lesion, or ulcer.    LABORATORY PANEL:   CBC  Recent Labs Lab 12/23/16 0511  WBC 9.0  HGB 10.4*  HCT 30.8*  PLT 268   ------------------------------------------------------------------------------------------------------------------  Chemistries   Recent Labs Lab 12/22/16 1304 12/23/16 0511  NA 136 135  K 4.0 3.7  CL 102 103  CO2 26 22  GLUCOSE 103* 94  BUN 18 17  CREATININE 0.85 0.75  0.74  CALCIUM 7.6* 6.9*  AST 24  --   ALT 22  --   ALKPHOS 73  --   BILITOT 0.3  --    ------------------------------------------------------------------------------------------------------------------  Cardiac Enzymes No results for input(s): TROPONINI in the last 168 hours. ------------------------------------------------------------------------------------------------------------------  RADIOLOGY:  Dg Abd 1 View  Result Date: 12/23/2016 CLINICAL DATA:  Abdominal pain. EXAM: ABDOMEN - 1 VIEW COMPARISON:  CT 12/22/2016. FINDINGS: Mild gastric prominence. No evidence of small-bowel distention or: Distention. Stool in the colon. Aortoiliac atherosclerotic vascular calcification. Degenerative changes lumbar spine prominent interstitial changes in the lung bases consistent with interstitial fibrosis again noted. IMPRESSION: Mild gastric distention. Electronically Signed   By: Marcello Moores  Register   On: 12/23/2016 15:10   Dg Abd 1 View  Result Date: 12/22/2016 CLINICAL DATA:  66 y/o  F; nausea and vomiting starting today. EXAM: ABDOMEN - 1 VIEW COMPARISON:  None. FINDINGS: The bowel gas pattern is within normal limits. No radio-opaque calculi seen. Chronic interstitial disease of the lung bases noted. IMPRESSION: Negative. Electronically Signed   By: Edgardo Roys.D.  On: 12/22/2016 06:24   Ct Abdomen Pelvis W Contrast  Result Date: 12/22/2016 CLINICAL DATA:  Periumbilical pain. Nausea, vomiting and diarrhea since yesterday. History of hysterectomy and uterine cancer. EXAM: CT ABDOMEN AND PELVIS WITH CONTRAST TECHNIQUE: Multidetector CT imaging of the abdomen and pelvis was performed using the standard protocol following bolus administration of intravenous contrast. CONTRAST:  161mL ISOVUE-300 IOPAMIDOL (ISOVUE-300) INJECTION 61% COMPARISON:  Chest CT 01/26/2016 FINDINGS: Lower chest: Right lower lobe unchanged 10.4 mm in average pulmonary nodule. Bold scattered areas of interstitial fibrosis with superimposed ground-glass opacities are again seen within both visualized lower lobes. Mild lower lobe bronchiectasis as before. Cardiomegaly with coronary arteriosclerosis. No pericardial effusion. Hepatobiliary: No focal liver abnormality is seen. No gallstones, gallbladder wall thickening, or biliary dilatation. Pancreas: Unremarkable. No pancreatic ductal dilatation or surrounding inflammatory changes. Spleen: Normal in size without focal abnormality. Adrenals/Urinary Tract: Normal bilateral adrenal glands. No obstructive uropathy. Bilateral renal cysts are noted the largest in the lower pole of the right kidney measuring approximately 3.1 x 2.6 cm. Urinary bladder is unremarkable. Stomach/Bowel: Nondistended stomach. Normal position of ligament of Treitz and small bowel rotation. Mild fluid-filled distention of small bowel loops without bowel obstruction. Findings may represent an enteritis. Moderate colonic stool burden throughout large bowel to the level of the splenic flexure. Colonic diverticulosis noted along the distal descending and sigmoid colon without acute inflammation. Normal-appearing appendix. Vascular/Lymphatic: Aortic atherosclerosis without aneurysm or dissection. No lymphadenopathy. Splenic and portal veins are patent. Celiac axis, SMA and both renal  arteries are patent. Calcifications at the origins of both renal arteries. Atherosclerotic common iliac arteries and their branches. Reproductive: Hysterectomy.  No adnexal mass. Other: Tiny fat containing umbilical hernia. Musculoskeletal: Mild disc space narrowing T7 through T12 and at L4-5. No acute osseous abnormality. IMPRESSION: 1. Chronic interstitial fibrosis and ground-glass opacities at the lung bases consistent with UIP. Stable right lower lobe pulmonary nodule averaging 10.4 mm. 2. Mild fluid-filled distention of small bowel may represent an enteritis. No bowel obstruction. 3. Small fat containing umbilical hernia. 4. Bilateral renal cysts. 5. Aortic and branch vessel atherosclerosis. Electronically Signed   By: Ashley Royalty M.D.   On: 12/22/2016 15:13   Dg Chest Port 1 View  Result Date: 12/23/2016 CLINICAL DATA:  Respiratory failure. EXAM: PORTABLE CHEST 1 VIEW COMPARISON:  12/22/18, 12/17/2016, 11/26/2016.  CT 12/16/2016. FINDINGS: Stable cardiomegaly. Diffuse bilateral from interstitial prominence noted consistent chronic interstitial lung disease. Again superimposed active interstitial process including interstitial pneumonitis and/or interstitial edema cannot be excluded. No pneumothorax . IMPRESSION: 1.  Stable cardiomegaly. 2. Diffuse chronic interstitial changes again noted. Superimposed active interstitial process such as pneumonitis and/or interstitial edema again cannot be excluded . Electronically Signed   By: Marcello Moores  Register   On: 12/23/2016 09:53   Dg Chest Port 1 View  Result Date: 12/22/2016 CLINICAL DATA:  Acute respiratory failure EXAM: PORTABLE CHEST 1 VIEW COMPARISON:  12/20/2016 FINDINGS: Cardiomegaly again noted. There is reticular interstitial prominence bilaterally with worsening from prior exam. Findings highly suspicious for edema or pneumonitis superimposed on chronic interstitial lung disease. Peripheral fibrotic changes again noted. IMPRESSION: There is reticular  interstitial prominence bilaterally with worsening from prior exam. Findings highly suspicious for edema or pneumonitis superimposed on chronic interstitial lung disease. Peripheral fibrotic changes again noted. Electronically Signed   By: Lahoma Crocker M.D.   On: 12/22/2016 12:27   Dg Duanne Limerick W/o Kub  Result Date: 12/23/2016 CLINICAL DATA:  Abdominal pain. EXAM: UPPER GI SERIES WITHOUT KUB TECHNIQUE: Routine  upper GI series was performed with thin barium. FLUOROSCOPY TIME:  Fluoroscopy Time:  No 1 minutes 0 seconds. Radiation Exposure Index (if provided by the fluoroscopic device): 33.5 mGy Number of Acquired Spot Images: 12 COMPARISON:  None. FINDINGS: Limited exam due the patient's clinical condition. This esophagus is widely patent. Stomach appears normal. Stomach emptied normally. Interim improvement of previously identified gastric distention. Duodenum is unremarkable. No significant reflux. IMPRESSION: Normal exam. Electronically Signed   By: Marcello Moores  Register   On: 12/23/2016 15:12    EKG:   Orders placed or performed in visit on 12/16/16  . EKG 12-Lead  . EKG 12-Lead  . 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  1. Acute hypoxic respiratory failure- in the setting of progressive pulmonary fibrosis with Possible  superimposed interstitial pneumonia: --High flow Oxygen  -Patient is restarted on antibiotics cefepime and vancomycin -Pancultures-Negative so far. Please follow up -Continue by mouth prednisone and chest physical therapy Appreciate pulmonology recommendations  Patient has an appointment to follow-up at [facility] transplant team in February  2. 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 difficile colitis but patient denies any diarrhea and no bowel movements yet GI is considering Reglan, discussing with pharmacy as patient is on Zoloft for potential interactions  #. Hypothyroidism: Continue Synthroid  #. Depression: Continue Zoloft  #. Hyperlipidemia: Continue atorvastatin   #. dvt px : lovenox  Pt was seen by palliative care- home with hospice care approved  Generalized weakness -PT- HHPT recommended  F/u with CM for home o2 arrangement  to go home when  patient is medically stable  Very poor prognosis High risk for acute pulmonary failure   All the records are reviewed and case discussed with Care Management/Social Workerr. Management plans discussed with the patient, family and they are in agreement.  CODE STATUS: partial code,husband and daughter HCPOA  TOTAL critical care TIME TAKING CARE OF THIS PATIENT: 41  minutes.   POSSIBLE D/C IN ? DAYS, DEPENDING ON CLINICAL CONDITION.  Note: This dictation was prepared with Dragon dictation along with smaller phrase technology. Any transcriptional errors that result from this process are unintentional.   Kelli Morris M.D on 12/23/2016 at 4:53 PM  Between 7am to 6pm - Pager - [phone] After 6pm go to [email or web address] - password [email or web address] [facility]  [facility]  Office  [phone]  CC: Primary care physician; Tommi Rumps, MD

## 2016-12-23 NOTE — Care Management (Signed)
Patient called this RNCM stating that her O2 concentrator at home only delivers up to 5 L and she wishes for 6L. I explained that hospice would probably assume O2 delivery and to make sure this was discussed with hospice. I will mention this to Santiago Glad with [facility] hospice too. Patient appears to be on HF O2 at this time.

## 2016-12-23 NOTE — Progress Notes (Signed)
Pharmacy Antibiotic Note  Kelli Morris is a 66 y.o. female admitted on 12/16/2016 with pneumonia.  Pharmacy has been consulted for cefepime and vancomcyin dosing.  Plan: Cefepime 2g IV Q12hr.    Vancomycin 1000mg  IV x 1. Will start vancomycin 750 mg IV Q12hr for goal trough of 15-20 mcg/ml.   Height: 5\' 3"  (160 cm) Weight: 183 lb 6.8 oz (83.2 kg) IBW/kg (Calculated) : 52.4  Temp (24hrs), Avg:99.5 F (37.5 C), Min:98.7 F (37.1 C), Max:100.8 F (38.2 C)   Recent Labs Lab 12/16/16 1033 12/16/16 1308 12/17/16 0521 12/17/16 1545 12/19/16 0640 12/22/16 0647 12/22/16 1304 12/22/16 1510 12/23/16 0511  WBC 12.3*  --  7.6  --  10.9  --  10.6  --  9.0  CREATININE 0.91  --  0.86 1.09* 0.76 0.74 0.85  --  0.74  LATICACIDVEN  --  1.0  --   --   --   --  0.8 0.7  --     Estimated Creatinine Clearance: 71.6 mL/min (by C-G formula based on SCr of 0.74 mg/dL).    No Known Allergies  Antimicrobials this admission: Cefepime 1/18 >>  Vancomycin 1/12 >>1/13, 1/18 >>  Zosyn 1/12 >> 1/16    Microbiology results: 1/18 BCx: pending  1/12 Sputum: normal flora  1/12 MRSA PCR: negative   Pharmacy will continue to monitor and adjust per consult.    Loree Fee, PharmD 7:53 AM 12/23/2016

## 2016-12-23 NOTE — Care Management (Signed)
There is an prior authorization referral made through cover My meds for Esbriet 267mg  tabs. Quantity was entered as 270 tab- the number need for one month if patient tolerated 3 tid.  Printed copy of the referral has been placed in the shawdow chart.

## 2016-12-23 NOTE — Telephone Encounter (Signed)
Spoke with pt and she wants to keep her appt she has with DS on 12/30/16. Informed pt we will keep appt and will schedule further from that appt. Nothing further needed.

## 2016-12-23 NOTE — Progress Notes (Addendum)
[facility] Progess Note    ASSESSMENT/PLAN   Acute exacerbation of pulmonary fibrosis. Presently on broad-spectrum antibiotics to include vancomycin, cefepime, is on prednisone, prophylaxis with Lovenox and PPI. Somewhat improved abdominal pain and nausea. CT scan of the abdomen noted. C. difficile sent. Patient has been on OFEV but did not tolerate secondary to nausea and diarrhea, being changed to ESBRIET per Dr. Alva Garnet. Pending transfer when able to wean FiO2.  Name: Kelli Morris MRN: [id number] DOB: [birth year]    ADMISSION DATE:  12/16/2016   SUBJECTIVE:   Patient still remains on high flow oxygen at 60%. No real significant change in last 24 hours.  Review of Systems:  Constitutional: Feels well. Cardiovascular: No chest pain.  Pulmonary: Denies dyspnea.   The remainder of systems were reviewed and were found to be negative other than what is documented in the HPI.    VITAL SIGNS: Temp:  [98.7 F (37.1 C)-100.8 F (38.2 C)] 98.9 F (37.2 C) (01/19 0000) Pulse Rate:  [73-133] 103 (01/19 0900) Resp:  [16-36] 27 (01/19 0900) BP: (88-113)/(48-80) 93/80 (01/19 0900) SpO2:  [84 %-99 %] 88 % (01/19 0900) FiO2 (%):  [60 %] 60 % (01/19 0306) Weight:  [83.2 kg (183 lb 6.8 oz)] 83.2 kg (183 lb 6.8 oz) (01/18 1402) HEMODYNAMICS:   VENTILATOR SETTINGS: FiO2 (%):  [60 %] 60 % INTAKE / OUTPUT:  Intake/Output Summary (Last 24 hours) at 12/23/16 0930 Last data filed at 12/23/16 0300  Gross per 24 hour  Intake              570 ml  Output              150 ml  Net              420 ml    PHYSICAL EXAMINATION: Physical Examination:   VS: BP 93/80   Pulse (!) 103   Temp 98.9 F (37.2 C)   Resp (!) 27   Ht 5\' 3"  (1.6 m)   Wt 83.2 kg (183 lb 6.8 oz)   SpO2 (!) 88%   BMI 32.49 kg/m   General Appearance: No distress  Neuro:without focal findings, mental status normal. HEENT: PERRLA, EOM intact. Pulmonary: Basilar crackles appreciated bilaterally    CardiovascularNormal S1,S2.  No m/r/g.   Abdomen: Benign, Soft, non-tender. Extremities: Edema noted  LABORATORY PANEL:   CBC  Recent Labs Lab 12/23/16 0511  WBC 9.0  HGB 10.4*  HCT 30.8*  PLT 268    Chemistries   Recent Labs Lab 12/22/16 1304 12/23/16 0511  NA 136  --   K 4.0  --   CL 102  --   CO2 26  --   GLUCOSE 103*  --   BUN 18  --   CREATININE 0.85 0.74  CALCIUM 7.6*  --   AST 24  --   ALT 22  --   ALKPHOS 73  --   BILITOT 0.3  --      Recent Labs Lab 12/21/16 2102 12/22/16 0752 12/22/16 1159 12/22/16 1610 12/22/16 2146 12/23/16 0724  GLUCAP 160* 100* 103* 143* 148* 84   No results for input(s): PHART, PCO2ART, PO2ART in the last 168 hours.  Recent Labs Lab 12/17/16 0521 12/17/16 1545 12/22/16 1304  AST 17 25 24   ALT 15 15 22   ALKPHOS 83 84 73  BILITOT 0.4 0.5 0.3  ALBUMIN 2.8* 3.0* 2.7*    Cardiac Enzymes  Recent Labs Lab 12/16/16 1033  TROPONINI <0.03    RADIOLOGY:  Dg Abd 1 View  Result Date: 12/22/2016 CLINICAL DATA:  66 y/o  F; nausea and vomiting starting today. EXAM: ABDOMEN - 1 VIEW COMPARISON:  None. FINDINGS: The bowel gas pattern is within normal limits. No radio-opaque calculi seen. Chronic interstitial disease of the lung bases noted. IMPRESSION: Negative. Electronically Signed   By: Kristine Garbe M.D.   On: 12/22/2016 06:24   Ct Abdomen Pelvis W Contrast  Result Date: 12/22/2016 CLINICAL DATA:  Periumbilical pain. Nausea, vomiting and diarrhea since yesterday. History of hysterectomy and uterine cancer. EXAM: CT ABDOMEN AND PELVIS WITH CONTRAST TECHNIQUE: Multidetector CT imaging of the abdomen and pelvis was performed using the standard protocol following bolus administration of intravenous contrast. CONTRAST:  110mL ISOVUE-300 IOPAMIDOL (ISOVUE-300) INJECTION 61% COMPARISON:  Chest CT 01/26/2016 FINDINGS: Lower chest: Right lower lobe unchanged 10.4 mm in average pulmonary nodule. Bold scattered areas of  interstitial fibrosis with superimposed ground-glass opacities are again seen within both visualized lower lobes. Mild lower lobe bronchiectasis as before. Cardiomegaly with coronary arteriosclerosis. No pericardial effusion. Hepatobiliary: No focal liver abnormality is seen. No gallstones, gallbladder wall thickening, or biliary dilatation. Pancreas: Unremarkable. No pancreatic ductal dilatation or surrounding inflammatory changes. Spleen: Normal in size without focal abnormality. Adrenals/Urinary Tract: Normal bilateral adrenal glands. No obstructive uropathy. Bilateral renal cysts are noted the largest in the lower pole of the right kidney measuring approximately 3.1 x 2.6 cm. Urinary bladder is unremarkable. Stomach/Bowel: Nondistended stomach. Normal position of ligament of Treitz and small bowel rotation. Mild fluid-filled distention of small bowel loops without bowel obstruction. Findings may represent an enteritis. Moderate colonic stool burden throughout large bowel to the level of the splenic flexure. Colonic diverticulosis noted along the distal descending and sigmoid colon without acute inflammation. Normal-appearing appendix. Vascular/Lymphatic: Aortic atherosclerosis without aneurysm or dissection. No lymphadenopathy. Splenic and portal veins are patent. Celiac axis, SMA and both renal arteries are patent. Calcifications at the origins of both renal arteries. Atherosclerotic common iliac arteries and their branches. Reproductive: Hysterectomy.  No adnexal mass. Other: Tiny fat containing umbilical hernia. Musculoskeletal: Mild disc space narrowing T7 through T12 and at L4-5. No acute osseous abnormality. IMPRESSION: 1. Chronic interstitial fibrosis and ground-glass opacities at the lung bases consistent with UIP. Stable right lower lobe pulmonary nodule averaging 10.4 mm. 2. Mild fluid-filled distention of small bowel may represent an enteritis. No bowel obstruction. 3. Small fat containing umbilical  hernia. 4. Bilateral renal cysts. 5. Aortic and branch vessel atherosclerosis. Electronically Signed   By: Ashley Royalty M.D.   On: 12/22/2016 15:13   Dg Chest Port 1 View  Result Date: 12/22/2016 CLINICAL DATA:  Acute respiratory failure EXAM: PORTABLE CHEST 1 VIEW COMPARISON:  12/20/2016 FINDINGS: Cardiomegaly again noted. There is reticular interstitial prominence bilaterally with worsening from prior exam. Findings highly suspicious for edema or pneumonitis superimposed on chronic interstitial lung disease. Peripheral fibrotic changes again noted. IMPRESSION: There is reticular interstitial prominence bilaterally with worsening from prior exam. Findings highly suspicious for edema or pneumonitis superimposed on chronic interstitial lung disease. Peripheral fibrotic changes again noted. Electronically Signed   By: Lahoma Crocker M.D.   On: 12/22/2016 12:27       Hermelinda Dellen, DO [facility] Office Number: [phone]  Patricia Pesa, M.D.  Vilinda Boehringer, M.D.  Merton Border, M.D  12/23/2016

## 2016-12-23 NOTE — Telephone Encounter (Signed)
Will await forms.

## 2016-12-23 NOTE — Consult Note (Signed)
Consultation  Referring Provider:     Dr Melene Muller Admit date: 12/16/16 Consult date 12/23/16        Reason for Consultation:   Abdominal pain, nv, fevers           HPI:   Kelli Morris is a 66 y.o. female with history of advanced/fulminant pulmonary fibrosis, GERD, hyperlipidemia, essential hypertension, hypothyroidism, uterine cancer, and anxiety , who was admitted for acute hypoxic respiratory failure due to worsening of underlying pulmonary fibrosis/pneumonia and has been treated with IV antibiotics. Was running fevers at home as well. Also admitted 11/25/16 with IPF exacerbation and pneumonia, treated with levaquin/steroids.  Regarding this hospitalization, she was to be discharged to home with hospice, however developed some further respiratory deterioration and was transferred to ICU. Plans to try a new IPF drug called pirfenidaone- tried a med called Ofev in past but did not tolerate due to nausea and diarrhea. Has appt at [facility] next month to discuss possible transplant.  GI consulted due to recurrent epigastric abdominal pain, last onset yesterday with vomiting: CT pcr and c-diff tests ordered already due to current vanc/cefepime. Stool studies have not been collected yet. CT abdomen/pelvis done yesterday with the following impression: 1. Chronic interstitial fibrosis and ground-glass opacities at the lung bases consistent with UIP. Stable right lower lobe pulmonary nodule averaging 10.4 mm. 2. Mild fluid-filled distention of small bowel may represent an enteritis. No bowel obstruction. 3. Small fat containing umbilical hernia. 4. Bilateral renal cysts. 5. Aortic and branch vessel atherosclerosis.  Recent Labs: CBC with hgb 10.4, otherwise unremarkable; gfr >60. Prelim blood cultures negative. Albumin 2.7, liver enzymes normal  Patient reports she since late December she has had recurrent episodes of epigastric pain and NV- unable to identify any triggering event or other associations.  States she has not been taking any NSAIDs at home, although has been given some Ibuprofen and ASA while hospitalized. Feels like these episodes contribute to her respiratory issues. States she has continued on bid Pantoprazole 40mg  and denies gerdlike symptoms.  States otherwise she has been moving her bowels well on qd Miralax. Has not had a bm in 2d. Denies further GI complaints. States she has no abdominal pain/nausea at this time.  She did have endoscopic procedures 11/20/16 in which there was a stricture in the distal colon which prevented the scope passing past the sigmoid colon, however 3 adenomas were removed. A virtual colonoscopy was scheduled afterwards but patient cancelled this due to her pulmonary issues and stated she would call back to reschedule when better. Also had EGD due to Gerd issues 11/20/16 with reflux esophagitis and gastritis. There was some duodenitis, possible duodenal diverticulum, and a sharp angulation/possible stenosis in the duodenum that looked as if she had a duodenal ulcer at one time. There was no Designer, television/film set.     Past Medical History:  Diagnosis Date  . Arrhythmia    afib  . Cancer ([place])    uterine  . Fibromyalgia    Chronic fatigue  . GERD (gastroesophageal reflux disease)   . Headache(784.0)   . History of headache   . History of low back pain   . Hyperlipidemia   . Hypertension   . Hypothyroidism   . Idiopathic pulmonary fibrosis ([place])   . Lyme disease   . Pulmonary fibrosis ([place])   . PVD (peripheral vascular disease) ([place])   . Strain, cervical   . Thrombosis   . Tobacco use     Past Surgical History:  Procedure Laterality Date  . ABDOMINAL HYSTERECTOMY    . BREAST BIOPSY Left 1990  . COLONOSCOPY WITH PROPOFOL N/A 11/21/2016   Procedure: COLONOSCOPY WITH PROPOFOL;  Surgeon: Lollie Sails, MD;  Location: [facility] ENDOSCOPY;  Service: Endoscopy;  Laterality: N/A;  . ESOPHAGOGASTRODUODENOSCOPY (EGD) WITH PROPOFOL  N/A 11/21/2016   Procedure: ESOPHAGOGASTRODUODENOSCOPY (EGD) WITH PROPOFOL;  Surgeon: Lollie Sails, MD;  Location: [facility] ENDOSCOPY;  Service: Endoscopy;  Laterality: N/A;  . LUMBAR SPINE SURGERY    . OVARY SURGERY    . TENDON REPAIR Right    Hand  . TONSILLECTOMY    . TUBAL LIGATION Bilateral   . ULNAR NERVE TRANSPOSITION Left   . VAGINA SURGERY     biopsy  . VULVECTOMY PARTIAL      Family History  Problem Relation Age of Onset  . Uterine cancer Mother   . Leukemia Mother   . Congestive Heart Failure Mother   . COPD Mother   . Diabetes Father   . Congestive Heart Failure Father   . Heart disease Maternal Grandfather   . Heart disease Paternal Grandfather   . Arthritis Brother   . Neuropathy Brother   . Breast cancer Neg Hx     Social History  Substance Use Topics  . Smoking status: Former Smoker    Packs/day: 0.50    Years: 45.00    Types: Cigarettes  . Smokeless tobacco: Never Used  . Alcohol use Yes     Comment: Consumes alcohol on occasion    Prior to Admission medications   Medication Sig Start Date End Date Taking? Authorizing Provider  acetaminophen (TYLENOL) 325 MG tablet Take 650 mg by mouth every 6 (six) hours as needed.   Yes Historical Provider, MD  albuterol (PROVENTIL HFA;VENTOLIN HFA) 108 (90 Base) MCG/ACT inhaler Inhale 1-2 puffs into the lungs every 6 (six) hours as needed for wheezing or shortness of breath.   Yes Historical Provider, MD  Aspirin-Caffeine (BC FAST PAIN RELIEF ARTHRITIS) 1000-65 MG PACK Take 800 mg by mouth 4 (four) times daily.    Yes Historical Provider, MD  atorvastatin (LIPITOR) 40 MG tablet TAKE 1 TABLET (40 MG TOTAL) BY MOUTH DAILY. 10/03/16  Yes Wellington Hampshire, MD  azithromycin (ZITHROMAX) 250 MG tablet Please take 500 mg (2 tablets) by mouth today, then take 250 mg (1 tablet) by mouth daily 12/12/16  Yes Leone Haven, MD  ibuprofen (ADVIL,MOTRIN) 200 MG tablet Take 200 mg by mouth every 6 (six) hours as needed.   Yes  Historical Provider, MD  levothyroxine (SYNTHROID, LEVOTHROID) 25 MCG tablet Take 1 tablet (25 mcg total) by mouth daily before breakfast. 07/08/16  Yes Leone Haven, MD  pantoprazole (PROTONIX) 40 MG tablet Take 40 mg by mouth 2 (two) times daily.   Yes Historical Provider, MD  predniSONE (DELTASONE) 10 MG tablet Take 1 tablet (10 mg total) by mouth daily with breakfast. 12/06/16  Yes Wilhelmina Mcardle, MD  sertraline (ZOLOFT) 50 MG tablet TAKE ONE TABLET BY MOUTH DAILY 11/09/16  Yes Leone Haven, MD  ALPRAZolam Duanne Moron) 0.5 MG tablet TAKE ONE TABLET BY MOUTH TWICE A DAY AS NEEDED FOR ANXIETY 12/19/16   Leone Haven, MD    Current Facility-Administered Medications  Medication Dose Route Frequency Provider Last Rate Last Dose  . 0.9 %  sodium chloride infusion  250 mL Intravenous PRN Dustin Flock, MD      . acetaminophen (TYLENOL) tablet 650 mg  650 mg Oral Q6H PRN Shreyang  Posey Pronto, MD   650 mg at 12/22/16 1203  . ALPRAZolam Duanne Moron) tablet 0.5 mg  0.5 mg Oral TID PRN Nicholes Mango, MD   0.5 mg at 12/23/16 0923  . atorvastatin (LIPITOR) tablet 40 mg  40 mg Oral q1800 Dustin Flock, MD   40 mg at 12/22/16 2154  . budesonide (PULMICORT) nebulizer solution 0.5 mg  0.5 mg Nebulization BID Wilhelmina Mcardle, MD   0.5 mg at 12/23/16 0813  . ceFEPIme (MAXIPIME) 2 g in dextrose 5 % 50 mL IVPB  2 g Intravenous Q12H Nicholes Mango, MD   2 g at 12/23/16 0924  . enoxaparin (LOVENOX) injection 40 mg  40 mg Subcutaneous Q24H Dustin Flock, MD   40 mg at 12/22/16 2154  . HYDROcodone-acetaminophen (NORCO/VICODIN) 5-325 MG per tablet 1-2 tablet  1-2 tablet Oral Q4H PRN Dustin Flock, MD   1 tablet at 12/23/16 0648  . ibuprofen (ADVIL,MOTRIN) tablet 200 mg  200 mg Oral Q6H PRN Dustin Flock, MD   200 mg at 12/20/16 0517  . insulin aspart (novoLOG) injection 0-15 Units  0-15 Units Subcutaneous TID WC Wilhelmina Mcardle, MD   2 Units at 12/22/16 1615  . insulin aspart (novoLOG) injection 0-5 Units  0-5 Units  Subcutaneous QHS Wilhelmina Mcardle, MD      . ipratropium-albuterol (DUONEB) 0.5-2.5 (3) MG/3ML nebulizer solution 3 mL  3 mL Nebulization Q6H Dustin Flock, MD   3 mL at 12/23/16 0813  . levothyroxine (SYNTHROID, LEVOTHROID) tablet 25 mcg  25 mcg Oral QAC breakfast Dustin Flock, MD   25 mcg at 12/23/16 [phone]  . ondansetron (ZOFRAN) tablet 4 mg  4 mg Oral Q6H PRN Dustin Flock, MD   4 mg at 12/22/16 2203   Or  . ondansetron (ZOFRAN) injection 4 mg  4 mg Intravenous Q6H PRN Dustin Flock, MD      . pantoprazole (PROTONIX) injection 40 mg  40 mg Intravenous Q12H Nicholes Mango, MD   40 mg at 12/23/16 0923  . predniSONE (DELTASONE) tablet 40 mg  40 mg Oral Q breakfast Laverle Hobby, MD   40 mg at 12/23/16 0806  . promethazine (PHENERGAN) injection 12.5 mg  12.5 mg Intravenous Q6H PRN Awilda Bill, NP   12.5 mg at 12/22/16 1640  . sertraline (ZOLOFT) tablet 50 mg  50 mg Oral Daily Dustin Flock, MD   50 mg at 12/23/16 0923  . sodium chloride flush (NS) 0.9 % injection 3 mL  3 mL Intravenous Q12H Dustin Flock, MD   3 mL at 12/23/16 0928  . sodium chloride flush (NS) 0.9 % injection 3 mL  3 mL Intravenous PRN Dustin Flock, MD   3 mL at 12/22/16 0248  . vancomycin (VANCOCIN) IVPB 750 mg/150 ml premix  750 mg Intravenous Q12H Napoleon Form, RPH   750 mg at 12/23/16 1023    Allergies as of 12/16/2016  . (No Known Allergies)     Review of Systems:    All systems reviewed and negative except where noted in HPI.      Physical Exam:  Vital signs in last 24 hours: Temp:  [98.7 F (37.1 C)-100.8 F (38.2 C)] 98.9 F (37.2 C) (01/19 0000) Pulse Rate:  [73-133] 103 (01/19 0900) Resp:  [16-34] 27 (01/19 0900) BP: (88-113)/(48-80) 93/80 (01/19 0900) SpO2:  [86 %-99 %] 88 % (01/19 0900) FiO2 (%):  [60 %] 60 % (01/19 0306) Weight:  [83.2 kg (183 lb 6.8 oz)] 83.2 kg (183 lb 6.8 oz) (01/18  1402) Last BM Date: 12/20/16 General:   Pleasant woman, appears illl,  in NAD Head:   Normocephalic and atraumatic. Eyes:   No icterus.   Conjunctiva pink. Ears:  Normal auditory acuity. Mouth: Mucosa pink moist, no lesions. Neck:  Supple; no masses felt Lungs:  Wearing high flow 02 canula. Respirations even and unlabored. Lungs clear to auscultation bilaterally.   No wheezes, crackles, or rhonchi.  Heart:  S1S2, RRR, no MRG. No edema. Abdomen:   Flat, soft, nondistended, nontender. Normal bowel sounds. No appreciable masses or hepatomegaly. No rebound signs or other peritoneal signs. Rectal:  Not performed.  Msk:  MAEW x4, No clubbing or cyanosis. Strength 5/5. Symmetrical without gross deformities. Neurologic:  Alert and  oriented x4;  Cranial nerves II-XII intact.  Skin:  Warm, dry, pink without significant lesions or rashes. Psych:  Alert and cooperative. Normal affect.  LAB RESULTS:  Recent Labs  12/22/16 1304 12/23/16 0511  WBC 10.6 9.0  HGB 10.9* 10.4*  HCT 32.0* 30.8*  PLT 274 268   BMET  Recent Labs  12/22/16 0647 12/22/16 1304 12/23/16 0511  NA  --  136 135  K  --  4.0 3.7  CL  --  102 103  CO2  --  26 22  GLUCOSE  --  103* 94  BUN  --  18 17  CREATININE 0.74 0.85 0.75  0.74  CALCIUM  --  7.6* 6.9*   LFT  Recent Labs  12/22/16 1304  PROT 5.9*  ALBUMIN 2.7*  AST 24  ALT 22  ALKPHOS 73  BILITOT 0.3   PT/INR No results for input(s): LABPROT, INR in the last 72 hours.  STUDIES: Dg Abd 1 View  Result Date: 12/22/2016 CLINICAL DATA:  66 y/o  F; nausea and vomiting starting today. EXAM: ABDOMEN - 1 VIEW COMPARISON:  None. FINDINGS: The bowel gas pattern is within normal limits. No radio-opaque calculi seen. Chronic interstitial disease of the lung bases noted. IMPRESSION: Negative. Electronically Signed   By: Kristine Garbe M.D.   On: 12/22/2016 06:24   Ct Abdomen Pelvis W Contrast  Result Date: 12/22/2016 CLINICAL DATA:  Periumbilical pain. Nausea, vomiting and diarrhea since yesterday. History of hysterectomy and uterine  cancer. EXAM: CT ABDOMEN AND PELVIS WITH CONTRAST TECHNIQUE: Multidetector CT imaging of the abdomen and pelvis was performed using the standard protocol following bolus administration of intravenous contrast. CONTRAST:  142mL ISOVUE-300 IOPAMIDOL (ISOVUE-300) INJECTION 61% COMPARISON:  Chest CT 01/26/2016 FINDINGS: Lower chest: Right lower lobe unchanged 10.4 mm in average pulmonary nodule. Bold scattered areas of interstitial fibrosis with superimposed ground-glass opacities are again seen within both visualized lower lobes. Mild lower lobe bronchiectasis as before. Cardiomegaly with coronary arteriosclerosis. No pericardial effusion. Hepatobiliary: No focal liver abnormality is seen. No gallstones, gallbladder wall thickening, or biliary dilatation. Pancreas: Unremarkable. No pancreatic ductal dilatation or surrounding inflammatory changes. Spleen: Normal in size without focal abnormality. Adrenals/Urinary Tract: Normal bilateral adrenal glands. No obstructive uropathy. Bilateral renal cysts are noted the largest in the lower pole of the right kidney measuring approximately 3.1 x 2.6 cm. Urinary bladder is unremarkable. Stomach/Bowel: Nondistended stomach. Normal position of ligament of Treitz and small bowel rotation. Mild fluid-filled distention of small bowel loops without bowel obstruction. Findings may represent an enteritis. Moderate colonic stool burden throughout large bowel to the level of the splenic flexure. Colonic diverticulosis noted along the distal descending and sigmoid colon without acute inflammation. Normal-appearing appendix. Vascular/Lymphatic: Aortic atherosclerosis without aneurysm or dissection. No lymphadenopathy.  Splenic and portal veins are patent. Celiac axis, SMA and both renal arteries are patent. Calcifications at the origins of both renal arteries. Atherosclerotic common iliac arteries and their branches. Reproductive: Hysterectomy.  No adnexal mass. Other: Tiny fat containing  umbilical hernia. Musculoskeletal: Mild disc space narrowing T7 through T12 and at L4-5. No acute osseous abnormality. IMPRESSION: 1. Chronic interstitial fibrosis and ground-glass opacities at the lung bases consistent with UIP. Stable right lower lobe pulmonary nodule averaging 10.4 mm. 2. Mild fluid-filled distention of small bowel may represent an enteritis. No bowel obstruction. 3. Small fat containing umbilical hernia. 4. Bilateral renal cysts. 5. Aortic and branch vessel atherosclerosis. Electronically Signed   By: Ashley Royalty M.D.   On: 12/22/2016 15:13   Dg Chest Port 1 View  Result Date: 12/23/2016 CLINICAL DATA:  Respiratory failure. EXAM: PORTABLE CHEST 1 VIEW COMPARISON:  12/22/18, 12/17/2016, 11/26/2016.  CT 12/16/2016. FINDINGS: Stable cardiomegaly. Diffuse bilateral from interstitial prominence noted consistent chronic interstitial lung disease. Again superimposed active interstitial process including interstitial pneumonitis and/or interstitial edema cannot be excluded. No pneumothorax . IMPRESSION: 1.  Stable cardiomegaly. 2. Diffuse chronic interstitial changes again noted. Superimposed active interstitial process such as pneumonitis and/or interstitial edema again cannot be excluded . Electronically Signed   By: Marcello Moores  Register   On: 12/23/2016 09:53   Dg Chest Port 1 View  Result Date: 12/22/2016 CLINICAL DATA:  Acute respiratory failure EXAM: PORTABLE CHEST 1 VIEW COMPARISON:  12/20/2016 FINDINGS: Cardiomegaly again noted. There is reticular interstitial prominence bilaterally with worsening from prior exam. Findings highly suspicious for edema or pneumonitis superimposed on chronic interstitial lung disease. Peripheral fibrotic changes again noted. IMPRESSION: There is reticular interstitial prominence bilaterally with worsening from prior exam. Findings highly suspicious for edema or pneumonitis superimposed on chronic interstitial lung disease. Peripheral fibrotic changes again  noted. Electronically Signed   By: Lahoma Crocker M.D.   On: 12/22/2016 12:27       Impression / Plan:   1. Epigasttic abdominal pain/nv: possible duodenal stenosis. KUB today to follow up on possible enteritis/air fluid levels.  Schedule UGIS. Avoid NSAIDs. Restart MIralax qd. Agree with CLIQ diet, would advance to [facility] and the low residue very slowly. Considering a trial of metoclopramide however patient is on sertraline- discussed with [facility] pharmacist due to potential interaction and as she is on low dose of zolofot- think this will be ok-however he did recommend possibly discontinuing the sertraline.   Thank you very much for this consult. These services were provided by Stephens November, NP-C, in collaboration with Lollie Sails, MD, with whom I have discussed this patient in full.   Stephens November, NP-C

## 2016-12-23 NOTE — Progress Notes (Signed)
Pt is using flutter valve instead of percussor. Pt prefers flutter over percussor.

## 2016-12-23 NOTE — Consult Note (Signed)
Please see full Consult by Ms Jacqulyn Liner.  Patient with history of pulmonary fibrosis, admitted with  Respiratory failure.   Also with c/o abdominal bloating, nausea and emesis (decreased over the past 2 days).  Currently feeling some better.  No diarrhea for several days ( had diarrhea with previous OFEV.   UGIS negative/normal, no evidence of outlet obstruction.  Would consider trial of metoclopromide low dose, 5 mg tid half hour before meals.  Also could use simethecone prn use for epigastric bloating.   Will wait for outpatient followup to re-arrange CT colonography.   I will not be available over the weekend, but will have GI follow, I will be back Monday. When advancing diet, per recs in consult.

## 2016-12-24 LAB — BASIC METABOLIC PANEL
Anion gap: 8 (ref 5–15)
BUN: 9 mg/dL (ref 6–20)
CHLORIDE: 100 mmol/L — AB (ref 101–111)
CO2: 29 mmol/L (ref 22–32)
Calcium: 6.8 mg/dL — ABNORMAL LOW (ref 8.9–10.3)
Creatinine, Ser: 0.83 mg/dL (ref 0.44–1.00)
GFR calc non Af Amer: >60 mL/min (ref >60)
Glucose, Bld: 88 mg/dL (ref 65–99)
POTASSIUM: 3.5 mmol/L (ref 3.5–5.1)
SODIUM: 137 mmol/L (ref 135–145)

## 2016-12-24 LAB — VANCOMYCIN, TROUGH: Vancomycin Tr: 27 ug/mL (ref 15–20)

## 2016-12-24 LAB — GLUCOSE, CAPILLARY
GLUCOSE-CAPILLARY: 75 mg/dL (ref 65–99)
GLUCOSE-CAPILLARY: 169 mg/dL — AB (ref 65–99)
GLUCOSE-CAPILLARY: 177 mg/dL — AB (ref 65–99)
GLUCOSE-CAPILLARY: 137 mg/dL — AB (ref 65–99)

## 2016-12-24 LAB — PROCALCITONIN: Procalcitonin: 0.1 ng/mL

## 2016-12-24 LAB — URINE CULTURE: Culture: NO GROWTH

## 2016-12-24 LAB — ECHOCARDIOGRAM COMPLETE
Height: 63 in
Weight: 2934.76 oz

## 2016-12-24 MED ORDER — POLYETHYLENE GLYCOL 3350 17 G PO PACK
17.0000 g | PACK | Freq: Every day | ORAL | Status: DC
  Administered 2016-12-24 – 2016-12-26 (×3): 17 g via ORAL
  Filled 2016-12-24 (×3): qty 1

## 2016-12-24 MED ORDER — SODIUM CHLORIDE 0.9 % IV SOLN: Freq: Once | INTRAVENOUS | Status: DC

## 2016-12-24 MED ORDER — ENSURE ENLIVE PO LIQD
237.0000 mL | Freq: Two times a day (BID) | ORAL | Status: DC
  Administered 2016-12-24 (×2): 237 mL via ORAL

## 2016-12-24 MED ORDER — SIMETHICONE 80 MG PO CHEW
80.0000 mg | CHEWABLE_TABLET | Freq: Three times a day (TID) | ORAL | Status: DC
  Administered 2016-12-24 – 2016-12-26 (×7): 80 mg via ORAL
  Filled 2016-12-24 (×7): qty 1

## 2016-12-24 NOTE — Progress Notes (Signed)
Pharmacy Antibiotic Note  Kelli Morris is a 66 y.o. female admitted on 12/16/2016 with pneumonia.  Pharmacy has been consulted for cefepime and vancomcyin dosing. Vancomycin d/c 12/24/16.  Plan: Cefepime 2g IV Q12hr.    Vancomycin 1000mg  IV x 1. Will start vancomycin 750 mg IV Q12hr for goal trough of 15-20 mcg/ml.   1/20; Vanc trough= 27 mcg/ml.  Vancomycin order now discontinued per MD.      Height: 5\' 3"  (160 cm) Weight: 183 lb 6.8 oz (83.2 kg) IBW/kg (Calculated) : 52.4  Temp (24hrs), Avg:98.4 F (36.9 C), Min:97.9 F (36.6 C), Max:98.8 F (37.1 C)   Recent Labs Lab 12/19/16 0640 12/22/16 0647 12/22/16 1304 12/22/16 1510 12/23/16 0511 12/24/16 0426 12/24/16 1030  WBC 10.9  --  10.6  --  9.0  --   --   CREATININE 0.76 0.74 0.85  --  0.75  0.74 0.83  --   LATICACIDVEN  --   --  0.8 0.7  --   --   --   VANCOTROUGH  --   --   --   --   --   --  27*    Estimated Creatinine Clearance: 69 mL/min (by C-G formula based on SCr of 0.83 mg/dL).    No Known Allergies  Antimicrobials this admission: Cefepime 1/18 >>  Vancomycin 1/12 >>1/13, 1/18 >> 1/20 Zosyn 1/12 >> 1/16    Microbiology results: 1/18 BCx: pending  1/12 Sputum: normal flora  1/12 MRSA PCR: negative   Pharmacy will continue to monitor and adjust per consult.    Sabryna Lahm A, PharmD 12:01 PM 12/24/2016

## 2016-12-24 NOTE — Progress Notes (Signed)
PT Cancellation Note  Patient Details Name: YARLIN LAUREANO MRN: [id number] DOB: 05/22/1951   Cancelled Treatment:    Reason Eval/Treat Not Completed: Medical issues which prohibited therapy. After performing chart review, PT noted patient transfer to ICU without order to continue treatment. Completing old order. Patient will require new order if PT is indicated.   Dorice Lamas, PT, DPT 12/24/2016, 8:07 AM

## 2016-12-24 NOTE — Progress Notes (Signed)
[facility] [place] NAME: Kelli Morris    MR#:  [id number]  DATE OF BIRTH:  [birth year]  SUBJECTIVE:   patient much better no n/v/or sob bck on [place]  REVIEW OF SYSTEMS:    Review of Systems  Constitutional: Negative.  Negative for chills, fever and malaise/fatigue.  HENT: Negative.  Negative for ear discharge, ear pain, hearing loss, nosebleeds and sore throat.   Eyes: Negative.  Negative for blurred vision and pain.  Respiratory: Negative.  Negative for cough, hemoptysis, shortness of breath and wheezing.   Cardiovascular: Negative.  Negative for chest pain, palpitations and leg swelling.  Gastrointestinal: Negative.  Negative for abdominal pain, blood in stool, diarrhea, nausea and vomiting.  Genitourinary: Negative.  Negative for dysuria.  Musculoskeletal: Negative.  Negative for back pain.  Skin: Negative.   Neurological: Negative for dizziness, tremors, speech change, focal weakness, seizures and headaches.  Endo/Heme/Allergies: Negative.  Does not bruise/bleed easily.  Psychiatric/Behavioral: Negative.  Negative for depression, hallucinations and suicidal ideas.    Tolerating Diet: yes      DRUG ALLERGIES:  No Known Allergies  VITALS:  Blood pressure 98/61, pulse 97, temperature 97.9 F (36.6 C), temperature source Oral, resp. rate (!) 22, height 5\' 3"  (1.6 m), weight 83.2 kg (183 lb 6.8 oz), SpO2 96 %.  PHYSICAL EXAMINATION:   Physical Exam  Constitutional: She is oriented to person, place, and time and well-developed, well-nourished, and in no distress. No distress.  HENT:  Head: Normocephalic.  Eyes: No scleral icterus.  Neck: Normal range of motion. Neck supple. No JVD present. No tracheal deviation present.  Cardiovascular: Normal rate, regular rhythm and normal heart sounds.  Exam reveals no gallop and no friction rub.   No murmur heard. Pulmonary/Chest: Effort normal and breath sounds normal. No respiratory distress.  She has no wheezes. She has no rales. She exhibits no tenderness.  Abdominal: Soft. Bowel sounds are normal. She exhibits no distension and no mass. There is no tenderness. There is no rebound and no guarding.  Musculoskeletal: Normal range of motion. She exhibits no edema.  Neurological: She is alert and oriented to person, place, and time.  Skin: Skin is warm. No rash noted. No erythema.  Psychiatric: Affect and judgment normal.      LABORATORY PANEL:   CBC  Recent Labs Lab 12/23/16 0511  WBC 9.0  HGB 10.4*  HCT 30.8*  PLT 268   ------------------------------------------------------------------------------------------------------------------  Chemistries   Recent Labs Lab 12/22/16 1304  12/24/16 0426  NA 136  < > 137  K 4.0  < > 3.5  CL 102  < > 100*  CO2 26  < > 29  GLUCOSE 103*  < > 88  BUN 18  < > 9  CREATININE 0.85  < > 0.83  CALCIUM 7.6*  < > 6.8*  AST 24  --   --   ALT 22  --   --   ALKPHOS 73  --   --   BILITOT 0.3  --   --   < > = values in this interval not displayed. ------------------------------------------------------------------------------------------------------------------  Cardiac Enzymes No results for input(s): TROPONINI in the last 168 hours. ------------------------------------------------------------------------------------------------------------------  RADIOLOGY:  Dg Abd 1 View  Result Date: 12/23/2016 CLINICAL DATA:  Abdominal pain. EXAM: ABDOMEN - 1 VIEW COMPARISON:  CT 12/22/2016. FINDINGS: Mild gastric prominence. No evidence of small-bowel distention or: Distention. Stool in the colon. Aortoiliac atherosclerotic vascular calcification. Degenerative changes lumbar spine prominent  interstitial changes in the lung bases consistent with interstitial fibrosis again noted. IMPRESSION: Mild gastric distention. Electronically Signed   By: Marcello Moores  Register   On: 12/23/2016 15:10   Ct Abdomen Pelvis W Contrast  Result Date:  12/22/2016 CLINICAL DATA:  Periumbilical pain. Nausea, vomiting and diarrhea since yesterday. History of hysterectomy and uterine cancer. EXAM: CT ABDOMEN AND PELVIS WITH CONTRAST TECHNIQUE: Multidetector CT imaging of the abdomen and pelvis was performed using the standard protocol following bolus administration of intravenous contrast. CONTRAST:  173mL ISOVUE-300 IOPAMIDOL (ISOVUE-300) INJECTION 61% COMPARISON:  Chest CT 01/26/2016 FINDINGS: Lower chest: Right lower lobe unchanged 10.4 mm in average pulmonary nodule. Bold scattered areas of interstitial fibrosis with superimposed ground-glass opacities are again seen within both visualized lower lobes. Mild lower lobe bronchiectasis as before. Cardiomegaly with coronary arteriosclerosis. No pericardial effusion. Hepatobiliary: No focal liver abnormality is seen. No gallstones, gallbladder wall thickening, or biliary dilatation. Pancreas: Unremarkable. No pancreatic ductal dilatation or surrounding inflammatory changes. Spleen: Normal in size without focal abnormality. Adrenals/Urinary Tract: Normal bilateral adrenal glands. No obstructive uropathy. Bilateral renal cysts are noted the largest in the lower pole of the right kidney measuring approximately 3.1 x 2.6 cm. Urinary bladder is unremarkable. Stomach/Bowel: Nondistended stomach. Normal position of ligament of Treitz and small bowel rotation. Mild fluid-filled distention of small bowel loops without bowel obstruction. Findings may represent an enteritis. Moderate colonic stool burden throughout large bowel to the level of the splenic flexure. Colonic diverticulosis noted along the distal descending and sigmoid colon without acute inflammation. Normal-appearing appendix. Vascular/Lymphatic: Aortic atherosclerosis without aneurysm or dissection. No lymphadenopathy. Splenic and portal veins are patent. Celiac axis, SMA and both renal arteries are patent. Calcifications at the origins of both renal arteries.  Atherosclerotic common iliac arteries and their branches. Reproductive: Hysterectomy.  No adnexal mass. Other: Tiny fat containing umbilical hernia. Musculoskeletal: Mild disc space narrowing T7 through T12 and at L4-5. No acute osseous abnormality. IMPRESSION: 1. Chronic interstitial fibrosis and ground-glass opacities at the lung bases consistent with UIP. Stable right lower lobe pulmonary nodule averaging 10.4 mm. 2. Mild fluid-filled distention of small bowel may represent an enteritis. No bowel obstruction. 3. Small fat containing umbilical hernia. 4. Bilateral renal cysts. 5. Aortic and branch vessel atherosclerosis. Electronically Signed   By: Ashley Royalty M.D.   On: 12/22/2016 15:13   Dg Chest Port 1 View  Result Date: 12/23/2016 CLINICAL DATA:  Respiratory failure. EXAM: PORTABLE CHEST 1 VIEW COMPARISON:  12/22/18, 12/17/2016, 11/26/2016.  CT 12/16/2016. FINDINGS: Stable cardiomegaly. Diffuse bilateral from interstitial prominence noted consistent chronic interstitial lung disease. Again superimposed active interstitial process including interstitial pneumonitis and/or interstitial edema cannot be excluded. No pneumothorax . IMPRESSION: 1.  Stable cardiomegaly. 2. Diffuse chronic interstitial changes again noted. Superimposed active interstitial process such as pneumonitis and/or interstitial edema again cannot be excluded . Electronically Signed   By: Marcello Moores  Register   On: 12/23/2016 09:53   Dg Chest Port 1 View  Result Date: 12/22/2016 CLINICAL DATA:  Acute respiratory failure EXAM: PORTABLE CHEST 1 VIEW COMPARISON:  12/20/2016 FINDINGS: Cardiomegaly again noted. There is reticular interstitial prominence bilaterally with worsening from prior exam. Findings highly suspicious for edema or pneumonitis superimposed on chronic interstitial lung disease. Peripheral fibrotic changes again noted. IMPRESSION: There is reticular interstitial prominence bilaterally with worsening from prior exam. Findings  highly suspicious for edema or pneumonitis superimposed on chronic interstitial lung disease. Peripheral fibrotic changes again noted. Electronically Signed   By: Orlean Bradford.D.  On: 12/22/2016 12:27   Dg Duanne Limerick W/o Kub  Result Date: 12/23/2016 CLINICAL DATA:  Abdominal pain. EXAM: UPPER GI SERIES WITHOUT KUB TECHNIQUE: Routine upper GI series was performed with thin barium. FLUOROSCOPY TIME:  Fluoroscopy Time:  No 1 minutes 0 seconds. Radiation Exposure Index (if provided by the fluoroscopic device): 33.5 mGy Number of Acquired Spot Images: 12 COMPARISON:  None. FINDINGS: Limited exam due the patient's clinical condition. This esophagus is widely patent. Stomach appears normal. Stomach emptied normally. Interim improvement of previously identified gastric distention. Duodenum is unremarkable. No significant reflux. IMPRESSION: Normal exam. Electronically Signed   By: Marcello Moores  Register   On: 12/23/2016 15:12     ASSESSMENT AND PLAN:    66 year old female with idiopathic pulmonary fibrosis who presents with acute hypoxic respiratory failure due to worsening of underlying pulmonary fibrosis.  1. Acute hypoxic respiratory failure- in the setting of progressive pulmonary fibrosis with superimposed interstitial pneumonia: She is back on [place] and off of [facility] Cntinue cefepime  MRSA negative stop vancomycin Patient has an appointment to follow-up at [facility] transplant team in February Patient being changed to [facility] per Dr Alva Garnet.  2. Acute lower abd pain with nause aand vomitting: resolived UGI without stenosis/obsturction Continue Phenergan which seems to help. Consider Reglan if not effective Start low residual diet    3. Hypothyroidism: Continue Synthroid  4. Depression: Continue Zoloft  5Hyperlipidemia: Continue atorvastatin  Can leave unit if needed later this afternoon   Management plans discussed with the patient and daughter and she is in agreement.  CODE STATUS:  partial  TOTAL TIME TAKING CARE OF THIS PATIENT: 30 minutes.     POSSIBLE D/C tomorrow, DEPENDING ON CLINICAL CONDITION.   Symir Mah M.D on 12/24/2016 at 11:44 AM  Between 7am to 6pm - Pager - [phone] After 6pm go to [email or web address] - password [email or web address] [facility]  Office  [phone]  CC: Primary care physician; Tommi Rumps, MD  Note: This dictation was prepared with Dragon dictation along with smaller phrase technology. Any transcriptional errors that result from this process are unintentional.

## 2016-12-24 NOTE — Progress Notes (Signed)
Pt is using flutter valve for her CPT. Pt prefers the flutter over the percussor.

## 2016-12-24 NOTE — Progress Notes (Signed)
[facility] Progess Note    ASSESSMENT/PLAN   Acute exacerbation of pulmonary fibrosis. Presently on broad-spectrum antibiotics to include vancomycin, cefepime, is on prednisone, prophylaxis with Lovenox and PPI. If still on heated high flow oxygen at 50% FiO2, which is decreased from 65 yesterday. Will decrease to 40 and if tolerates, will change to nasal cannula. Patient has been on OFEV but did not tolerate secondary to nausea and diarrhea, being changed to [facility] per Dr. Alva Garnet. Pending transfer when able to wean FiO2.  Name: Kelli Morris MRN: [id number] DOB: Feb 09, 1951    ADMISSION DATE:  12/16/2016   SUBJECTIVE:   Patient still remains on high flow oxygen at 60%. No real significant change in last 24 hours.  Review of Systems:  Constitutional: Feels well. Cardiovascular: No chest pain.  Pulmonary: Denies dyspnea.   The remainder of systems were reviewed and were found to be negative other than what is documented in the HPI.    VITAL SIGNS: Temp:  [97.9 F (36.6 C)-98.8 F (37.1 C)] 97.9 F (36.6 C) (01/20 0432) Pulse Rate:  [77-126] 89 (01/20 0600) Resp:  [16-29] 22 (01/20 0600) BP: (84-152)/(60-83) 115/77 (01/20 0600) SpO2:  [81 %-97 %] 95 % (01/20 0720) FiO2 (%):  [50 %-60 %] 50 % (01/20 0720) HEMODYNAMICS:   VENTILATOR SETTINGS: FiO2 (%):  [50 %-60 %] 50 % INTAKE / OUTPUT:  Intake/Output Summary (Last 24 hours) at 12/24/16 0811 Last data filed at 12/24/16 0300  Gross per 24 hour  Intake              203 ml  Output             3025 ml  Net            -2822 ml    PHYSICAL EXAMINATION: Physical Examination:   VS: BP 115/77   Pulse 89   Temp 97.9 F (36.6 C) (Oral)   Resp (!) 22   Ht 5\' 3"  (1.6 m)   Wt 83.2 kg (183 lb 6.8 oz)   SpO2 95%   BMI 32.49 kg/m   General Appearance: No distress  Neuro:without focal findings, mental status normal. HEENT: PERRLA, EOM intact. Pulmonary: Basilar crackles appreciated bilaterally     CardiovascularNormal S1,S2.  No m/r/g.   Abdomen: Benign, Soft, non-tender. Extremities: Edema noted  LABORATORY PANEL:   CBC  Recent Labs Lab 12/23/16 0511  WBC 9.0  HGB 10.4*  HCT 30.8*  PLT 268    Chemistries   Recent Labs Lab 12/22/16 1304  12/24/16 0426  NA 136  < > 137  K 4.0  < > 3.5  CL 102  < > 100*  CO2 26  < > 29  GLUCOSE 103*  < > 88  BUN 18  < > 9  CREATININE 0.85  < > 0.83  CALCIUM 7.6*  < > 6.8*  AST 24  --   --   ALT 22  --   --   ALKPHOS 73  --   --   BILITOT 0.3  --   --   < > = values in this interval not displayed.   Recent Labs Lab 12/22/16 2146 12/23/16 0724 12/23/16 1203 12/23/16 1536 12/23/16 2051 12/24/16 0736  GLUCAP 148* 84 123* 140* 104* 75   No results for input(s): PHART, PCO2ART, PO2ART in the last 168 hours.  Recent Labs Lab 12/17/16 1545 12/22/16 1304  AST 25 24  ALT 15 22  ALKPHOS 84 73  BILITOT 0.5 0.3  ALBUMIN 3.0* 2.7*    Cardiac Enzymes No results for input(s): TROPONINI in the last 168 hours.  RADIOLOGY:  Dg Abd 1 View  Result Date: 12/23/2016 CLINICAL DATA:  Abdominal pain. EXAM: ABDOMEN - 1 VIEW COMPARISON:  CT 12/22/2016. FINDINGS: Mild gastric prominence. No evidence of small-bowel distention or: Distention. Stool in the colon. Aortoiliac atherosclerotic vascular calcification. Degenerative changes lumbar spine prominent interstitial changes in the lung bases consistent with interstitial fibrosis again noted. IMPRESSION: Mild gastric distention. Electronically Signed   By: Marcello Moores  Register   On: 12/23/2016 15:10   Ct Abdomen Pelvis W Contrast  Result Date: 12/22/2016 CLINICAL DATA:  Periumbilical pain. Nausea, vomiting and diarrhea since yesterday. History of hysterectomy and uterine cancer. EXAM: CT ABDOMEN AND PELVIS WITH CONTRAST TECHNIQUE: Multidetector CT imaging of the abdomen and pelvis was performed using the standard protocol following bolus administration of intravenous contrast. CONTRAST:   177mL ISOVUE-300 IOPAMIDOL (ISOVUE-300) INJECTION 61% COMPARISON:  Chest CT 01/26/2016 FINDINGS: Lower chest: Right lower lobe unchanged 10.4 mm in average pulmonary nodule. Bold scattered areas of interstitial fibrosis with superimposed ground-glass opacities are again seen within both visualized lower lobes. Mild lower lobe bronchiectasis as before. Cardiomegaly with coronary arteriosclerosis. No pericardial effusion. Hepatobiliary: No focal liver abnormality is seen. No gallstones, gallbladder wall thickening, or biliary dilatation. Pancreas: Unremarkable. No pancreatic ductal dilatation or surrounding inflammatory changes. Spleen: Normal in size without focal abnormality. Adrenals/Urinary Tract: Normal bilateral adrenal glands. No obstructive uropathy. Bilateral renal cysts are noted the largest in the lower pole of the right kidney measuring approximately 3.1 x 2.6 cm. Urinary bladder is unremarkable. Stomach/Bowel: Nondistended stomach. Normal position of ligament of Treitz and small bowel rotation. Mild fluid-filled distention of small bowel loops without bowel obstruction. Findings may represent an enteritis. Moderate colonic stool burden throughout large bowel to the level of the splenic flexure. Colonic diverticulosis noted along the distal descending and sigmoid colon without acute inflammation. Normal-appearing appendix. Vascular/Lymphatic: Aortic atherosclerosis without aneurysm or dissection. No lymphadenopathy. Splenic and portal veins are patent. Celiac axis, SMA and both renal arteries are patent. Calcifications at the origins of both renal arteries. Atherosclerotic common iliac arteries and their branches. Reproductive: Hysterectomy.  No adnexal mass. Other: Tiny fat containing umbilical hernia. Musculoskeletal: Mild disc space narrowing T7 through T12 and at L4-5. No acute osseous abnormality. IMPRESSION: 1. Chronic interstitial fibrosis and ground-glass opacities at the lung bases consistent with  UIP. Stable right lower lobe pulmonary nodule averaging 10.4 mm. 2. Mild fluid-filled distention of small bowel may represent an enteritis. No bowel obstruction. 3. Small fat containing umbilical hernia. 4. Bilateral renal cysts. 5. Aortic and branch vessel atherosclerosis. Electronically Signed   By: Ashley Royalty M.D.   On: 12/22/2016 15:13   Dg Chest Port 1 View  Result Date: 12/23/2016 CLINICAL DATA:  Respiratory failure. EXAM: PORTABLE CHEST 1 VIEW COMPARISON:  12/22/18, 12/17/2016, 11/26/2016.  CT 12/16/2016. FINDINGS: Stable cardiomegaly. Diffuse bilateral from interstitial prominence noted consistent chronic interstitial lung disease. Again superimposed active interstitial process including interstitial pneumonitis and/or interstitial edema cannot be excluded. No pneumothorax . IMPRESSION: 1.  Stable cardiomegaly. 2. Diffuse chronic interstitial changes again noted. Superimposed active interstitial process such as pneumonitis and/or interstitial edema again cannot be excluded . Electronically Signed   By: Marcello Moores  Register   On: 12/23/2016 09:53   Dg Chest Port 1 View  Result Date: 12/22/2016 CLINICAL DATA:  Acute respiratory failure EXAM: PORTABLE CHEST 1 VIEW COMPARISON:  12/20/2016 FINDINGS: Cardiomegaly again  noted. There is reticular interstitial prominence bilaterally with worsening from prior exam. Findings highly suspicious for edema or pneumonitis superimposed on chronic interstitial lung disease. Peripheral fibrotic changes again noted. IMPRESSION: There is reticular interstitial prominence bilaterally with worsening from prior exam. Findings highly suspicious for edema or pneumonitis superimposed on chronic interstitial lung disease. Peripheral fibrotic changes again noted. Electronically Signed   By: Lahoma Crocker M.D.   On: 12/22/2016 12:27   Dg Duanne Limerick W/o Kub  Result Date: 12/23/2016 CLINICAL DATA:  Abdominal pain. EXAM: UPPER GI SERIES WITHOUT KUB TECHNIQUE: Routine upper GI series was  performed with thin barium. FLUOROSCOPY TIME:  Fluoroscopy Time:  No 1 minutes 0 seconds. Radiation Exposure Index (if provided by the fluoroscopic device): 33.5 mGy Number of Acquired Spot Images: 12 COMPARISON:  None. FINDINGS: Limited exam due the patient's clinical condition. This esophagus is widely patent. Stomach appears normal. Stomach emptied normally. Interim improvement of previously identified gastric distention. Duodenum is unremarkable. No significant reflux. IMPRESSION: Normal exam. Electronically Signed   By: Marcello Moores  Register   On: 12/23/2016 15:12       Hermelinda Dellen, DO [facility] Office Number: [phone]  Patricia Pesa, M.D.  Vilinda Boehringer, M.D.  Merton Border, M.D  12/24/2016

## 2016-12-24 NOTE — Consult Note (Signed)
Consultation  Referring Provider:      Primary Care Physician:  Tommi Rumps, MD Primary Gastroenterologist:  San Jetty MD       Reason for Consultation:    Nausea and intermittent abdominal pain.      Impression / Plan:   Nausea: Continue with Phenergan q 8 hrs. If not effective use Reglan 5 mg tid. Low residue and low fructose diet to decrease abdominal bloating. Simethicone can be tried but I have not found effective. Avoid chronic narcotics.  Constipation: Miralax 1-2 tbsp a day a goal to have 1 BM every 1-2 days.   Possible duodenal ulcer seen on EGD 11-2016. UGI was negative. Avoid NSAIDS. Marland Kitchen Continue Protonix.           HPI:   Kelli Morris is a 66 y.o. female with history of advanced/fulminant pulmonary fibrosis, GERD, hyperlipidemia, essential hypertension, hypothyroidism, uterine cancer, and anxiety , who was admitted for acute hypoxic respiratory failure due to worsening of underlying pulmonary fibrosis/pneumonia and has been treated with IV antibiotics. with history of advanced/fulminant pulmonary fibrosis, GERD, hyperlipidemia, essential hypertension, hypothyroidism, uterine cancer, and anxiety , who was admitted for acute hypoxic respiratory failure due to worsening of underlying pulmonary fibrosis/pneumonia and has been treated with IV antibiotics.   Patient reports that last 2 months she has intermittent abdominal pain with increasing nausea and vomiting. This may have exacerbated her respiratory condition.   EGD 11-2016 showed questionable ulcer but recent UGI were normal.  CT scan showed fluid filled SB may represent enteritis. No SBO seen. Moderate amount of stool in colon.   H/x of constipation on Miralax. Also h/x of IBS and past h/x of complicated TAH in 0000000 causing adhesions.   Past Medical History:  Diagnosis Date  . Arrhythmia    afib  . Cancer ([place])    uterine  . Fibromyalgia    Chronic fatigue  . GERD (gastroesophageal reflux disease)   .  Headache(784.0)   . History of headache   . History of low back pain   . Hyperlipidemia   . Hypertension   . Hypothyroidism   . Idiopathic pulmonary fibrosis ([place])   . Lyme disease   . Pulmonary fibrosis ([place])   . PVD (peripheral vascular disease) ([place])   . Strain, cervical   . Thrombosis   . Tobacco use     Past Surgical History:  Procedure Laterality Date  . ABDOMINAL HYSTERECTOMY    . BREAST BIOPSY Left 1990  . COLONOSCOPY WITH PROPOFOL N/A 11/21/2016   Procedure: COLONOSCOPY WITH PROPOFOL;  Surgeon: Lollie Sails, MD;  Location: [facility] ENDOSCOPY;  Service: Endoscopy;  Laterality: N/A;  . ESOPHAGOGASTRODUODENOSCOPY (EGD) WITH PROPOFOL N/A 11/21/2016   Procedure: ESOPHAGOGASTRODUODENOSCOPY (EGD) WITH PROPOFOL;  Surgeon: Lollie Sails, MD;  Location[facility] ENDOSCOPY;  Service: Endoscopy;  Laterality: N/A;  . LUMBAR SPINE SURGERY    . OVARY SURGERY    . TENDON REPAIR Right    Hand  . TONSILLECTOMY    . TUBAL LIGATION Bilateral   . ULNAR NERVE TRANSPOSITION Left   . VAGINA SURGERY     biopsy  . VULVECTOMY PARTIAL      Family History  Problem Relation Age of Onset  . Uterine cancer Mother   . Leukemia Mother   . Congestive Heart Failure Mother   . COPD Mother   . Diabetes Father   . Congestive Heart Failure Father   . Heart disease Maternal Grandfather   . Heart disease Paternal Grandfather   .  Arthritis Brother   . Neuropathy Brother   . Breast cancer Neg Hx       Social History  Substance Use Topics  . Smoking status: Former Smoker    Packs/day: 0.50    Years: 45.00    Types: Cigarettes  . Smokeless tobacco: Never Used  . Alcohol use Yes     Comment: Consumes alcohol on occasion    Prior to Admission medications   Medication Sig Start Date End Date Taking? Authorizing Provider  acetaminophen (TYLENOL) 325 MG tablet Take 650 mg by mouth every 6 (six) hours as needed.   Yes Historical Provider, MD  albuterol (PROVENTIL HFA;VENTOLIN HFA) 108 (90  Base) MCG/ACT inhaler Inhale 1-2 puffs into the lungs every 6 (six) hours as needed for wheezing or shortness of breath.   Yes Historical Provider, MD  Aspirin-Caffeine (BC FAST PAIN RELIEF ARTHRITIS) 1000-65 MG PACK Take 800 mg by mouth 4 (four) times daily.    Yes Historical Provider, MD  atorvastatin (LIPITOR) 40 MG tablet TAKE 1 TABLET (40 MG TOTAL) BY MOUTH DAILY. 10/03/16  Yes Wellington Hampshire, MD  azithromycin (ZITHROMAX) 250 MG tablet Please take 500 mg (2 tablets) by mouth today, then take 250 mg (1 tablet) by mouth daily 12/12/16  Yes Leone Haven, MD  ibuprofen (ADVIL,MOTRIN) 200 MG tablet Take 200 mg by mouth every 6 (six) hours as needed.   Yes Historical Provider, MD  levothyroxine (SYNTHROID, LEVOTHROID) 25 MCG tablet Take 1 tablet (25 mcg total) by mouth daily before breakfast. 07/08/16  Yes Leone Haven, MD  pantoprazole (PROTONIX) 40 MG tablet Take 40 mg by mouth 2 (two) times daily.   Yes Historical Provider, MD  predniSONE (DELTASONE) 10 MG tablet Take 1 tablet (10 mg total) by mouth daily with breakfast. 12/06/16  Yes Wilhelmina Mcardle, MD  sertraline (ZOLOFT) 50 MG tablet TAKE ONE TABLET BY MOUTH DAILY 11/09/16  Yes Leone Haven, MD  ALPRAZolam Duanne Moron) 0.5 MG tablet TAKE ONE TABLET BY MOUTH TWICE A DAY AS NEEDED FOR ANXIETY 12/19/16   Leone Haven, MD    Current Facility-Administered Medications  Medication Dose Route Frequency Provider Last Rate Last Dose  . 0.9 %  sodium chloride infusion  250 mL Intravenous PRN Dustin Flock, MD      . acetaminophen (TYLENOL) tablet 650 mg  650 mg Oral Q6H PRN Dustin Flock, MD   650 mg at 12/22/16 1203  . ALPRAZolam Duanne Moron) tablet 0.5 mg  0.5 mg Oral TID PRN Nicholes Mango, MD   0.5 mg at 12/23/16 2236  . atorvastatin (LIPITOR) tablet 40 mg  40 mg Oral q1800 Dustin Flock, MD   40 mg at 12/23/16 1759  . budesonide (PULMICORT) nebulizer solution 0.5 mg  0.5 mg Nebulization BID Wilhelmina Mcardle, MD   0.5 mg at 12/24/16 [id number]  .  ceFEPIme (MAXIPIME) 2 g in dextrose 5 % 50 mL IVPB  2 g Intravenous Q12H Nicholes Mango, MD   2 g at 12/23/16 2236  . enoxaparin (LOVENOX) injection 40 mg  40 mg Subcutaneous Q24H Dustin Flock, MD   40 mg at 12/23/16 2236  . feeding supplement (ENSURE ENLIVE) (ENSURE ENLIVE) liquid 237 mL  237 mL Oral BID BM Mikael Spray, NP      . HYDROcodone-acetaminophen (NORCO/VICODIN) 5-325 MG per tablet 1-2 tablet  1-2 tablet Oral Q4H PRN Dustin Flock, MD   1 tablet at 12/24/16 0612  . ibuprofen (ADVIL,MOTRIN) tablet 200 mg  200 mg Oral  Q6H PRN Dustin Flock, MD   200 mg at 12/20/16 0517  . insulin aspart (novoLOG) injection 0-15 Units  0-15 Units Subcutaneous TID WC Wilhelmina Mcardle, MD   2 Units at 12/23/16 1759  . insulin aspart (novoLOG) injection 0-5 Units  0-5 Units Subcutaneous QHS Wilhelmina Mcardle, MD      . ipratropium-albuterol (DUONEB) 0.5-2.5 (3) MG/3ML nebulizer solution 3 mL  3 mL Nebulization Q6H Dustin Flock, MD   3 mL at 12/24/16 0718  . levothyroxine (SYNTHROID, LEVOTHROID) tablet 25 mcg  25 mcg Oral QAC breakfast Dustin Flock, MD   25 mcg at 12/24/16 0612  . ondansetron (ZOFRAN) tablet 4 mg  4 mg Oral Q6H PRN Dustin Flock, MD   4 mg at 12/22/16 2203   Or  . ondansetron (ZOFRAN) injection 4 mg  4 mg Intravenous Q6H PRN Dustin Flock, MD   4 mg at 12/24/16 0238  . pantoprazole (PROTONIX) EC tablet 40 mg  40 mg Oral BID Loree Fee, RPH   40 mg at 12/23/16 2236  . predniSONE (DELTASONE) tablet 40 mg  40 mg Oral Q breakfast Laverle Hobby, MD   40 mg at 12/24/16 0745  . promethazine (PHENERGAN) injection 12.5 mg  12.5 mg Intravenous Q6H PRN Awilda Bill, NP   12.5 mg at 12/22/16 1640  . sertraline (ZOLOFT) tablet 50 mg  50 mg Oral Daily Dustin Flock, MD   50 mg at 12/23/16 0923  . sodium chloride flush (NS) 0.9 % injection 3 mL  3 mL Intravenous Q12H Dustin Flock, MD   3 mL at 12/23/16 2237  . sodium chloride flush (NS) 0.9 % injection 3 mL  3 mL Intravenous PRN  Dustin Flock, MD   3 mL at 12/22/16 0248  . vancomycin (VANCOCIN) IVPB 750 mg/150 ml premix  750 mg Intravenous Q12H Napoleon Form, RPH   750 mg at 12/23/16 2315    Allergies as of 12/16/2016  . (No Known Allergies)     Review of Systems:    This is positive for those things mentioned in the HPI All other review of systems are negative.       Physical Exam:  Vital signs in last 24 hours: Temp:  [97.9 F (36.6 C)-98.8 F (37.1 C)] 97.9 F (36.6 C) (01/20 0432) Pulse Rate:  [77-126] 97 (01/20 0800) Resp:  [16-29] 22 (01/20 0800) BP: (84-152)/(60-83) 98/61 (01/20 0800) SpO2:  [81 %-97 %] 96 % (01/20 0800) FiO2 (%):  [50 %-60 %] 50 % (01/20 0720) Last BM Date: 12/20/16  General:  Well-developed, well-nourished and in no acute distress Eyes:  anicteric. ENT:   Mouth and posterior pharynx free of lesions.  Neck:   supple w/o thyromegaly or mass.  Lungs: Clear to auscultation bilaterally. Heart:  S1S2, no rubs, murmurs, gallops. Abdomen:  soft, non-tender, no hepatosplenomegaly, hernia, or mass and BS+.  Rectal: Lymph:  no cervical or supraclavicular adenopathy. Extremities:   no edema Skin   no rash. Neuro:  A&O x 3.  Psych:  appropriate mood and  Affect.   Data Reviewed:   LAB RESULTS:  Recent Labs  12/22/16 1304 12/23/16 0511  WBC 10.6 9.0  HGB 10.9* 10.4*  HCT 32.0* 30.8*  PLT 274 268   BMET  Recent Labs  12/22/16 1304 12/23/16 0511 12/24/16 0426  NA 136 135 137  K 4.0 3.7 3.5  CL 102 103 100*  CO2 26 22 29   GLUCOSE 103* 94 88  BUN 18 17  9  CREATININE 0.85 0.75  0.74 0.83  CALCIUM 7.6* 6.9* 6.8*   LFT  Recent Labs  12/22/16 1304  PROT 5.9*  ALBUMIN 2.7*  AST 24  ALT 22  ALKPHOS 73  BILITOT 0.3   PT/INR No results for input(s): LABPROT, INR in the last 72 hours.  STUDIES: Dg Abd 1 View  Result Date: 12/23/2016 CLINICAL DATA:  Abdominal pain. EXAM: ABDOMEN - 1 VIEW COMPARISON:  CT 12/22/2016. FINDINGS: Mild gastric  prominence. No evidence of small-bowel distention or: Distention. Stool in the colon. Aortoiliac atherosclerotic vascular calcification. Degenerative changes lumbar spine prominent interstitial changes in the lung bases consistent with interstitial fibrosis again noted. IMPRESSION: Mild gastric distention. Electronically Signed   By: Marcello Moores  Register   On: 12/23/2016 15:10   Ct Abdomen Pelvis W Contrast  Result Date: 12/22/2016 CLINICAL DATA:  Periumbilical pain. Nausea, vomiting and diarrhea since yesterday. History of hysterectomy and uterine cancer. EXAM: CT ABDOMEN AND PELVIS WITH CONTRAST TECHNIQUE: Multidetector CT imaging of the abdomen and pelvis was performed using the standard protocol following bolus administration of intravenous contrast. CONTRAST:  117mL ISOVUE-300 IOPAMIDOL (ISOVUE-300) INJECTION 61% COMPARISON:  Chest CT 01/26/2016 FINDINGS: Lower chest: Right lower lobe unchanged 10.4 mm in average pulmonary nodule. Bold scattered areas of interstitial fibrosis with superimposed ground-glass opacities are again seen within both visualized lower lobes. Mild lower lobe bronchiectasis as before. Cardiomegaly with coronary arteriosclerosis. No pericardial effusion. Hepatobiliary: No focal liver abnormality is seen. No gallstones, gallbladder wall thickening, or biliary dilatation. Pancreas: Unremarkable. No pancreatic ductal dilatation or surrounding inflammatory changes. Spleen: Normal in size without focal abnormality. Adrenals/Urinary Tract: Normal bilateral adrenal glands. No obstructive uropathy. Bilateral renal cysts are noted the largest in the lower pole of the right kidney measuring approximately 3.1 x 2.6 cm. Urinary bladder is unremarkable. Stomach/Bowel: Nondistended stomach. Normal position of ligament of Treitz and small bowel rotation. Mild fluid-filled distention of small bowel loops without bowel obstruction. Findings may represent an enteritis. Moderate colonic stool burden  throughout large bowel to the level of the splenic flexure. Colonic diverticulosis noted along the distal descending and sigmoid colon without acute inflammation. Normal-appearing appendix. Vascular/Lymphatic: Aortic atherosclerosis without aneurysm or dissection. No lymphadenopathy. Splenic and portal veins are patent. Celiac axis, SMA and both renal arteries are patent. Calcifications at the origins of both renal arteries. Atherosclerotic common iliac arteries and their branches. Reproductive: Hysterectomy.  No adnexal mass. Other: Tiny fat containing umbilical hernia. Musculoskeletal: Mild disc space narrowing T7 through T12 and at L4-5. No acute osseous abnormality. IMPRESSION: 1. Chronic interstitial fibrosis and ground-glass opacities at the lung bases consistent with UIP. Stable right lower lobe pulmonary nodule averaging 10.4 mm. 2. Mild fluid-filled distention of small bowel may represent an enteritis. No bowel obstruction. 3. Small fat containing umbilical hernia. 4. Bilateral renal cysts. 5. Aortic and branch vessel atherosclerosis. Electronically Signed   By: Ashley Royalty M.D.   On: 12/22/2016 15:13   Dg Chest Port 1 View  Result Date: 12/23/2016 CLINICAL DATA:  Respiratory failure. EXAM: PORTABLE CHEST 1 VIEW COMPARISON:  12/22/18, 12/17/2016, 11/26/2016.  CT 12/16/2016. FINDINGS: Stable cardiomegaly. Diffuse bilateral from interstitial prominence noted consistent chronic interstitial lung disease. Again superimposed active interstitial process including interstitial pneumonitis and/or interstitial edema cannot be excluded. No pneumothorax . IMPRESSION: 1.  Stable cardiomegaly. 2. Diffuse chronic interstitial changes again noted. Superimposed active interstitial process such as pneumonitis and/or interstitial edema again cannot be excluded . Electronically Signed   By: Marcello Moores  Register  On: 12/23/2016 09:53   Dg Chest Port 1 View  Result Date: 12/22/2016 CLINICAL DATA:  Acute respiratory failure  EXAM: PORTABLE CHEST 1 VIEW COMPARISON:  12/20/2016 FINDINGS: Cardiomegaly again noted. There is reticular interstitial prominence bilaterally with worsening from prior exam. Findings highly suspicious for edema or pneumonitis superimposed on chronic interstitial lung disease. Peripheral fibrotic changes again noted. IMPRESSION: There is reticular interstitial prominence bilaterally with worsening from prior exam. Findings highly suspicious for edema or pneumonitis superimposed on chronic interstitial lung disease. Peripheral fibrotic changes again noted. Electronically Signed   By: Lahoma Crocker M.D.   On: 12/22/2016 12:27   Dg Duanne Limerick W/o Kub  Result Date: 12/23/2016 CLINICAL DATA:  Abdominal pain. EXAM: UPPER GI SERIES WITHOUT KUB TECHNIQUE: Routine upper GI series was performed with thin barium. FLUOROSCOPY TIME:  Fluoroscopy Time:  No 1 minutes 0 seconds. Radiation Exposure Index (if provided by the fluoroscopic device): 33.5 mGy Number of Acquired Spot Images: 12 COMPARISON:  None. FINDINGS: Limited exam due the patient's clinical condition. This esophagus is widely patent. Stomach appears normal. Stomach emptied normally. Interim improvement of previously identified gastric distention. Duodenum is unremarkable. No significant reflux. IMPRESSION: Normal exam. Electronically Signed   By: Marcello Moores  Register   On: 12/23/2016 15:12     PREVIOUS ENDOSCOPIES:                  Thanks   LOS: 8 days   San Jetty MD @  12/24/2016, 9:09 AM

## 2016-12-25 LAB — GLUCOSE, CAPILLARY
GLUCOSE-CAPILLARY: 180 mg/dL — AB (ref 65–99)
Glucose-Capillary: 79 mg/dL (ref 65–99)
Glucose-Capillary: 134 mg/dL — ABNORMAL HIGH (ref 65–99)
Glucose-Capillary: 154 mg/dL — ABNORMAL HIGH (ref 65–99)

## 2016-12-25 MED ORDER — METOCLOPRAMIDE HCL 5 MG/ML IJ SOLN
5.0000 mg | Freq: Three times a day (TID) | INTRAMUSCULAR | Status: DC
  Administered 2016-12-25 – 2016-12-26 (×3): 5 mg via INTRAVENOUS
  Filled 2016-12-25 (×3): qty 2

## 2016-12-25 MED ORDER — DICYCLOMINE HCL 10 MG PO CAPS
10.0000 mg | ORAL_CAPSULE | Freq: Three times a day (TID) | ORAL | Status: DC
  Administered 2016-12-25 – 2016-12-26 (×3): 10 mg via ORAL
  Filled 2016-12-25 (×5): qty 1

## 2016-12-25 MED ORDER — MORPHINE SULFATE (PF) 4 MG/ML IV SOLN
INTRAVENOUS | Status: AC
  Administered 2016-12-25: 2 mg
  Filled 2016-12-25: qty 1

## 2016-12-25 MED ORDER — MORPHINE SULFATE (PF) 2 MG/ML IV SOLN
2.0000 mg | INTRAVENOUS | Status: DC | PRN
  Administered 2016-12-26: 2 mg via INTRAVENOUS
  Filled 2016-12-25: qty 1

## 2016-12-25 NOTE — Progress Notes (Addendum)
Initial Nutrition Assessment  DOCUMENTATION CODES:   Not applicable  INTERVENTION:  Ensure Enlive po BID, each supplement provides 350 kcal and 20 grams of protein  NUTRITION DIAGNOSIS:   Increased nutrient needs related to catabolic illness as evidenced by increased estimated needs from protein.  GOAL:   Patient will meet greater than or equal to 90% of their needs  MONITOR:   PO intake, Supplement acceptance  REASON FOR ASSESSMENT:   Consult Diet education  ASSESSMENT:   66 year old female with idiopathic pulmonary fibrosis who presents with acute hypoxic respiratory failure due to worsening of underlying pulmonary fibrosis, persistent nausea, anorexia, constipation and abdominal pain   Met with pt in room today. Pt reports eating 50% meals currently. Pt reports intermittent poor appetite for two months pta r/t nausea, vomiting, and abdominal distension. Per chart, pt wt stable. GI consult today; per MD note "EGD 11-2016 showed questionable ulcer but recent UGI were normal.  CT scan showed fluid filled SB may represent enteritis. No SBO seen. Moderate amount of stool in colon." RD received consult for low fiber/ low fructose education today which was provided. Pt possibly to discharge tomorrow with hospice.     Medications reviewed and include: cefepime, loveonx, insulin, reglan, protonix, prednisone, miralax, simethicone, hydrocodone, zofran, phenergran  Labs reviewed: Cl 100(L), Ca 6.8(L), Alb 2.7(L) 1/18  Nutrition-Focused physical exam completed. Findings are no fat depletion, mild muscle depletion, and mild edema.   Diet Order:  DIET SOFT Room service appropriate? Yes; Fluid consistency: Thin  Skin:  Reviewed, no issues  Last BM:  1/18  Height:   Ht Readings from Last 1 Encounters:  12/22/16 '5\' 3"'  (1.6 m)    Weight:   Wt Readings from Last 1 Encounters:  12/22/16 183 lb 6.8 oz (83.2 kg)    Ideal Body Weight:  52.2 kg  BMI:  Body mass index is 32.49  kg/m.  Estimated Nutritional Needs:   Kcal:  1700-200kcal/day   Protein:  108-124g/day   Fluid:  >1.7L/day   EDUCATION NEEDS:   Education needs addressed  Koleen Distance, RD, LDN Pager #- [phone]

## 2016-12-25 NOTE — Progress Notes (Addendum)
[facility] Progess Note    ASSESSMENT/PLAN   Acute exacerbation of pulmonary fibrosis. Have been able to wean FiO2 off of heated high flow down to nasal cannula. Patient with limited reserve and will desaturate with exertion however this is typical for IPF. She looks like she is doing quite well and stable for transfer. Changed to prednisone, de-escalate antibiotics, will follow with you until discharge.  Name: Kelli Morris MRN: [id number] DOB: 1951-08-08    ADMISSION DATE:  12/16/2016   SUBJECTIVE:   Patient still remains on high flow oxygen at 60%. No real significant change in last 24 hours.  Review of Systems:  Constitutional: Feels well. Cardiovascular: No chest pain.  Pulmonary: Denies dyspnea.   The remainder of systems were reviewed and were found to be negative other than what is documented in the HPI.    VITAL SIGNS: Temp:  [98.3 F (36.8 C)] 98.3 F (36.8 C) (01/20 1948) Pulse Rate:  [74-108] 85 (01/21 0700) Resp:  [16-27] 18 (01/21 0700) BP: (88-114)/(58-75) 104/70 (01/21 0700) SpO2:  [88 %-99 %] 93 % (01/21 0700) HEMODYNAMICS:   VENTILATOR SETTINGS:   INTAKE / OUTPUT:  Intake/Output Summary (Last 24 hours) at 12/25/16 0759 Last data filed at 12/25/16 0550  Gross per 24 hour  Intake              150 ml  Output             1950 ml  Net            -1800 ml    PHYSICAL EXAMINATION: Physical Examination:   VS: BP 104/70   Pulse 85   Temp 98.3 F (36.8 C) (Oral)   Resp 18   Ht 5\' 3"  (1.6 m)   Wt 83.2 kg (183 lb 6.8 oz)   SpO2 93%   BMI 32.49 kg/m   General Appearance: No distress  Neuro:without focal findings, mental status normal. HEENT: PERRLA, EOM intact. Pulmonary: Basilar crackles appreciated bilaterally   CardiovascularNormal S1,S2.  No m/r/g.   Abdomen: Benign, Soft, non-tender. Extremities: Edema noted  LABORATORY PANEL:   CBC  Recent Labs Lab 12/23/16 0511  WBC 9.0  HGB 10.4*  HCT 30.8*  PLT 268     Chemistries   Recent Labs Lab 12/22/16 1304  12/24/16 0426  NA 136  < > 137  K 4.0  < > 3.5  CL 102  < > 100*  CO2 26  < > 29  GLUCOSE 103*  < > 88  BUN 18  < > 9  CREATININE 0.85  < > 0.83  CALCIUM 7.6*  < > 6.8*  AST 24  --   --   ALT 22  --   --   ALKPHOS 73  --   --   BILITOT 0.3  --   --   < > = values in this interval not displayed.   Recent Labs Lab 12/23/16 2051 12/24/16 0736 12/24/16 1212 12/24/16 1637 12/24/16 2058 12/25/16 0750  GLUCAP 104* 75 169* 177* 137* 79   No results for input(s): PHART, PCO2ART, PO2ART in the last 168 hours.  Recent Labs Lab 12/22/16 1304  AST 24  ALT 22  ALKPHOS 73  BILITOT 0.3  ALBUMIN 2.7*    Cardiac Enzymes No results for input(s): TROPONINI in the last 168 hours.  RADIOLOGY:  Dg Abd 1 View  Result Date: 12/23/2016 CLINICAL DATA:  Abdominal pain. EXAM: ABDOMEN - 1 VIEW COMPARISON:  CT  12/22/2016. FINDINGS: Mild gastric prominence. No evidence of small-bowel distention or: Distention. Stool in the colon. Aortoiliac atherosclerotic vascular calcification. Degenerative changes lumbar spine prominent interstitial changes in the lung bases consistent with interstitial fibrosis again noted. IMPRESSION: Mild gastric distention. Electronically Signed   By: Marcello Moores  Register   On: 12/23/2016 15:10   Dg Chest Port 1 View  Result Date: 12/23/2016 CLINICAL DATA:  Respiratory failure. EXAM: PORTABLE CHEST 1 VIEW COMPARISON:  12/22/18, 12/17/2016, 11/26/2016.  CT 12/16/2016. FINDINGS: Stable cardiomegaly. Diffuse bilateral from interstitial prominence noted consistent chronic interstitial lung disease. Again superimposed active interstitial process including interstitial pneumonitis and/or interstitial edema cannot be excluded. No pneumothorax . IMPRESSION: 1.  Stable cardiomegaly. 2. Diffuse chronic interstitial changes again noted. Superimposed active interstitial process such as pneumonitis and/or interstitial edema again cannot  be excluded . Electronically Signed   By: Marcello Moores  Register   On: 12/23/2016 09:53   Dg Duanne Limerick W/o Kub  Result Date: 12/23/2016 CLINICAL DATA:  Abdominal pain. EXAM: UPPER GI SERIES WITHOUT KUB TECHNIQUE: Routine upper GI series was performed with thin barium. FLUOROSCOPY TIME:  Fluoroscopy Time:  No 1 minutes 0 seconds. Radiation Exposure Index (if provided by the fluoroscopic device): 33.5 mGy Number of Acquired Spot Images: 12 COMPARISON:  None. FINDINGS: Limited exam due the patient's clinical condition. This esophagus is widely patent. Stomach appears normal. Stomach emptied normally. Interim improvement of previously identified gastric distention. Duodenum is unremarkable. No significant reflux. IMPRESSION: Normal exam. Electronically Signed   By: Marcello Moores  Register   On: 12/23/2016 15:12       Hermelinda Dellen, DO [facility] Office Number: [phone]  Patricia Pesa, M.D.  Vilinda Boehringer, M.D.  Merton Border, M.D  12/25/2016

## 2016-12-25 NOTE — Progress Notes (Signed)
Family Meeting Note  Advance Directive:yes  Today a meeting took place with the Patient. Spouse daughter    The following clinical team members were present during this meeting:MD  The following were discussed:Patient's diagnosis:Progressive IPF  Patient's progosis: Unable to determine and Goals for treatment: limited code no ventliation  Additional follow-up to be provided: will go home with hospice services  Time spent during discussion: 18 minutes  Syla Devoss, MD

## 2016-12-25 NOTE — Progress Notes (Signed)
Up to [facility].  Sats dropped to 60's.  Dyspnea with exertion.  Assisted back to bed.  O2 increased to 6L's.  .  Sats slowly returned to normal limits.

## 2016-12-25 NOTE — Progress Notes (Signed)
Nutrition Education Note  RD consulted for nutrition education regarding a low fiber/low fructose diet  RD provided "low fiber therapy" handout from the [employer] of [employer]. Reviewed patient's dietary recall. Provided examples on ways to decrease fiber and fat intake in diet. Discouraged intake of high sugary and fatty foods, caffeine, and spicy foods. Provided examples of healthy meals and snacks. Teach back method used.  Expect good compliance.  Body mass index is 32.49 kg/m. Pt meets criteria for obese based on current BMI.  Current diet order is soft, patient is consuming approximately 50% of meals at this time.   Koleen Distance, RD, LDN Pager #- [phone]

## 2016-12-25 NOTE — Consult Note (Signed)
GI Inpatient Follow-up Note  Patient Identification: Kelli Morris is a 66 y.o. female persistent nausea, anorexia, constipation and abdominal pain. Respiratory condition improved. UGI and CT scan of abdomen normal. Started on simethicone yesterday.  Subjective:  Scheduled Inpatient Medications:  . atorvastatin  40 mg Oral q1800  . budesonide (PULMICORT) nebulizer solution  0.5 mg Nebulization BID  . ceFEPime (MAXIPIME) IV  2 g Intravenous Q12H  . enoxaparin (LOVENOX) injection  40 mg Subcutaneous Q24H  . feeding supplement (ENSURE ENLIVE)  237 mL Oral BID BM  . insulin aspart  0-15 Units Subcutaneous TID WC  . insulin aspart  0-5 Units Subcutaneous QHS  . ipratropium-albuterol  3 mL Nebulization Q6H  . levothyroxine  25 mcg Oral QAC breakfast  . metoCLOPramide (REGLAN) injection  5 mg Intravenous Q8H  . pantoprazole  40 mg Oral BID  . polyethylene glycol  17 g Oral Daily  . predniSONE  40 mg Oral Q breakfast  . sertraline  50 mg Oral Daily  . simethicone  80 mg Oral TID  . sodium chloride flush  3 mL Intravenous Q12H    Continuous Inpatient Infusions:    PRN Inpatient Medications:  sodium chloride, acetaminophen, ALPRAZolam, HYDROcodone-acetaminophen, ibuprofen, ondansetron **OR** ondansetron (ZOFRAN) IV, promethazine, sodium chloride flush  Review of Systems:  Constitutional: Weight is stable.  Eyes: No changes in vision. ENT: No oral lesions, sore throat.  GI: see HPI.  Heme/Lymph: No easy bruising.  CV: No chest pain.  GU: No hematuria.  Integumentary: No rashes.  Neuro: No headaches.  Psych: No depression/anxiety.  Endocrine: No heat/cold intolerance.  Allergic/Immunologic: No urticaria.  Resp: No cough, SOB.  Musculoskeletal: No joint swelling.    Physical Examination: BP 107/64   Pulse 91   Temp (!) 96.6 F (35.9 C) (Axillary)   Resp (!) 21   Ht 5\' 3"  (1.6 m)   Wt 83.2 kg (183 lb 6.8 oz)   SpO2 93%   BMI 32.49 kg/m  Gen: NAD, alert and oriented x  4 HEENT: PEERLA, EOMI, Neck: supple, no JVD or thyromegaly Chest: CTA bilaterally, no wheezes, crackles, or other adventitious sounds CV: RRR, no m/g/c/r Abd: soft, NT, ND, +BS in all four quadrants; no HSM, guarding, ridigity, or rebound tenderness Ext: no edema Skin: no rash or lesions noted   Data: Lab Results  Component Value Date   WBC 9.0 12/23/2016   HGB 10.4 (L) 12/23/2016   HCT 30.8 (L) 12/23/2016   MCV 93.3 12/23/2016   PLT 268 12/23/2016    Recent Labs Lab 12/19/16 0640 12/22/16 1304 12/23/16 0511  HGB 11.0* 10.9* 10.4*   Lab Results  Component Value Date   NA 137 12/24/2016   K 3.5 12/24/2016   CL 100 (L) 12/24/2016   CO2 29 12/24/2016   BUN 9 12/24/2016   CREATININE 0.83 12/24/2016   Lab Results  Component Value Date   ALT 22 12/22/2016   AST 24 12/22/2016   ALKPHOS 73 12/22/2016   BILITOT 0.3 12/22/2016   No results for input(s): APTT, INR, PTT in the last 168 hours.  Assessment/Plan: Kelli Morris is a 66 y.o. female complains nausea, anorexia, constipatiion and abdominal pain.  Recommendations:  Nausea/anorexia: Start Reglan 5 mg q 8 IV. Abdominal pain: Start on dicyclomine 10mg  tid . H/x of IBS Constipation: Continue Miralax.  Advance diet to soft.  Please call with questions or concerns.  Kelli Jetty, MD

## 2016-12-25 NOTE — Progress Notes (Signed)
[place] at [place] NAME: Kelli Morris    MR#:  [id number]  DATE OF BIRTH:  1951-08-09  SUBJECTIVE:   patient much better no n/v/or sob bck on [place]  REVIEW OF SYSTEMS:    Review of Systems  Constitutional: Negative.  Negative for chills, fever and malaise/fatigue.  HENT: Negative.  Negative for ear discharge, ear pain, hearing loss, nosebleeds and sore throat.   Eyes: Negative.  Negative for blurred vision and pain.  Respiratory: Negative.  Negative for cough, hemoptysis, shortness of breath and wheezing.   Cardiovascular: Negative.  Negative for chest pain, palpitations and leg swelling.  Gastrointestinal: Positive for abdominal pain and constipation. Negative for blood in stool, diarrhea, nausea and vomiting.  Genitourinary: Negative.  Negative for dysuria.  Musculoskeletal: Negative.  Negative for back pain.  Skin: Negative.   Neurological: Negative for dizziness, tremors, speech change, focal weakness, seizures and headaches.  Endo/Heme/Allergies: Negative.  Does not bruise/bleed easily.  Psychiatric/Behavioral: Negative.  Negative for depression, hallucinations and suicidal ideas.    Tolerating Diet: yes      DRUG ALLERGIES:  No Known Allergies  VITALS:  Blood pressure 107/64, pulse 91, temperature (!) 96.6 F (35.9 C), temperature source Axillary, resp. rate (!) 21, height 5\' 3"  (1.6 m), weight 83.2 kg (183 lb 6.8 oz), SpO2 93 %.  PHYSICAL EXAMINATION:   Physical Exam  Constitutional: She is oriented to person, place, and time and well-developed, well-nourished, and in no distress. No distress.  HENT:  Head: Normocephalic.  Eyes: No scleral icterus.  Neck: Normal range of motion. Neck supple. No JVD present. No tracheal deviation present.  Cardiovascular: Normal rate, regular rhythm and normal heart sounds.  Exam reveals no gallop and no friction rub.   No murmur heard. Pulmonary/Chest: Effort normal. No respiratory  distress. She has no wheezes. She has no rales. She exhibits no tenderness.  Ins crackles  Abdominal: Soft. Bowel sounds are normal. She exhibits no distension and no mass. There is no tenderness. There is no rebound and no guarding.  Musculoskeletal: Normal range of motion. She exhibits no edema.  Neurological: She is alert and oriented to person, place, and time.  Skin: Skin is warm. No rash noted. No erythema.  Psychiatric: Affect and judgment normal.      LABORATORY PANEL:   CBC  Recent Labs Lab 12/23/16 0511  WBC 9.0  HGB 10.4*  HCT 30.8*  PLT 268   ------------------------------------------------------------------------------------------------------------------  Chemistries   Recent Labs Lab 12/22/16 1304  12/24/16 0426  NA 136  < > 137  K 4.0  < > 3.5  CL 102  < > 100*  CO2 26  < > 29  GLUCOSE 103*  < > 88  BUN 18  < > 9  CREATININE 0.85  < > 0.83  CALCIUM 7.6*  < > 6.8*  AST 24  --   --   ALT 22  --   --   ALKPHOS 73  --   --   BILITOT 0.3  --   --   < > = values in this interval not displayed. ------------------------------------------------------------------------------------------------------------------  Cardiac Enzymes No results for input(s): TROPONINI in the last 168 hours. ------------------------------------------------------------------------------------------------------------------  RADIOLOGY:  Dg Abd 1 View  Result Date: 12/23/2016 CLINICAL DATA:  Abdominal pain. EXAM: ABDOMEN - 1 VIEW COMPARISON:  CT 12/22/2016. FINDINGS: Mild gastric prominence. No evidence of small-bowel distention or: Distention. Stool in the colon. Aortoiliac atherosclerotic vascular calcification. Degenerative changes lumbar  spine prominent interstitial changes in the lung bases consistent with interstitial fibrosis again noted. IMPRESSION: Mild gastric distention. Electronically Signed   By: Marcello Moores  Register   On: 12/23/2016 15:10   Dg Duanne Limerick W/o Kub  Result Date:  12/23/2016 CLINICAL DATA:  Abdominal pain. EXAM: UPPER GI SERIES WITHOUT KUB TECHNIQUE: Routine upper GI series was performed with thin barium. FLUOROSCOPY TIME:  Fluoroscopy Time:  No 1 minutes 0 seconds. Radiation Exposure Index (if provided by the fluoroscopic device): 33.5 mGy Number of Acquired Spot Images: 12 COMPARISON:  None. FINDINGS: Limited exam due the patient's clinical condition. This esophagus is widely patent. Stomach appears normal. Stomach emptied normally. Interim improvement of previously identified gastric distention. Duodenum is unremarkable. No significant reflux. IMPRESSION: Normal exam. Electronically Signed   By: Marcello Moores  Register   On: 12/23/2016 15:12     ASSESSMENT AND PLAN:    66 year old female with idiopathic pulmonary fibrosis who presents with acute hypoxic respiratory failure due to worsening of underlying pulmonary fibrosis.  1. Acute hypoxic respiratory failure- in the setting of progressive pulmonary fibrosis with superimposed interstitial pneumonia: Doing well on [place] Continue cefepime today then stop tomorrow MRSA negative vancomycin was discontinued. Patient has an appointment to follow-up at [facility] transplant team in February Patient being changed to [facility] per Dr Alva Garnet.  2. Acute lower abd pain with nause and vomitting: UGI without stenosis/obsturction Continue Phenergan/Reglan Start dicyclomine 10mg  tid per Gi consult. Soft diet   3. Hypothyroidism: Continue Synthroid  4. Depression: Continue Zoloft  5Hyperlipidemia: Continue atorvastatin   Management plans discussed with the patient and daughter and husband and she is in agreement.  CODE STATUS: partial  TOTAL TIME TAKING CARE OF THIS PATIENT:  24 minutes.     POSSIBLE D/C tomorrow with hospice, DEPENDING ON CLINICAL CONDITION.   Kerrie Latour M.D on 12/25/2016 at 11:45 AM  Between 7am to 6pm - Pager - [phone] After 6pm go to [email or web address] - password [email or web address] [place] [facility]  Office  [phone]  CC: Primary care physician; Tommi Rumps, MD  Note: This dictation was prepared with Dragon dictation along with smaller phrase technology. Any transcriptional errors that result from this process are unintentional.

## 2016-12-25 NOTE — Progress Notes (Signed)
Pharmacy Antibiotic Note  Kelli Morris is a 66 y.o. female admitted on 12/16/2016 with pneumonia.  Pharmacy has been consulted for cefepime and vancomcyin dosing. Vancomycin d/c 12/24/16.  Plan: Day 4- Cefepime 2g IV Q12hr.        Height: 5\' 3"  (160 cm) Weight: 183 lb 6.8 oz (83.2 kg) IBW/kg (Calculated) : 52.4  Temp (24hrs), Avg:97.5 F (36.4 C), Min:96.6 F (35.9 C), Max:98.3 F (36.8 C)   Recent Labs Lab 12/19/16 0640 12/22/16 0647 12/22/16 1304 12/22/16 1510 12/23/16 0511 12/24/16 0426 12/24/16 1030  WBC 10.9  --  10.6  --  9.0  --   --   CREATININE 0.76 0.74 0.85  --  0.75  0.74 0.83  --   LATICACIDVEN  --   --  0.8 0.7  --   --   --   VANCOTROUGH  --   --   --   --   --   --  27*    Estimated Creatinine Clearance: 69 mL/min (by C-G formula based on SCr of 0.83 mg/dL).    No Known Allergies  Antimicrobials this admission: Cefepime 1/18 >>  Vancomycin 1/12 >>1/13, 1/18 >> 1/20 Zosyn 1/12 >> 1/16    Microbiology results: 1/18 BCx: pending  1/12 Sputum: normal flora  1/12 MRSA PCR: negative   Pharmacy will continue to monitor and adjust per consult.    Noralee Space, PharmD 12:48 PM 12/25/2016

## 2016-12-26 ENCOUNTER — Telehealth: Payer: Self-pay | Admitting: *Deleted

## 2016-12-26 ENCOUNTER — Other Ambulatory Visit: Payer: Self-pay | Admitting: Family Medicine

## 2016-12-26 ENCOUNTER — Telehealth: Payer: Self-pay | Admitting: Family Medicine

## 2016-12-26 LAB — CBC
HEMATOCRIT: 30.4 % — AB (ref 35.0–47.0)
HEMOGLOBIN: 10.2 g/dL — AB (ref 12.0–16.0)
MCH: 31.2 pg (ref 26.0–34.0)
MCHC: 33.6 g/dL (ref 32.0–36.0)
MCV: 92.7 fL (ref 80.0–100.0)
Platelets: 310 10*3/uL (ref 150–440)
RBC: 3.27 MIL/uL — ABNORMAL LOW (ref 3.80–5.20)
RDW: 15 % — ABNORMAL HIGH (ref 11.5–14.5)
WBC: 13 10*3/uL — ABNORMAL HIGH (ref 3.6–11.0)

## 2016-12-26 LAB — BASIC METABOLIC PANEL
Anion gap: 8 (ref 5–15)
BUN: 10 mg/dL (ref 6–20)
CHLORIDE: 101 mmol/L (ref 101–111)
CO2: 28 mmol/L (ref 22–32)
CREATININE: 0.63 mg/dL (ref 0.44–1.00)
Calcium: 6.8 mg/dL — ABNORMAL LOW (ref 8.9–10.3)
GFR calc Af Amer: >60 mL/min (ref >60)
GFR calc non Af Amer: >60 mL/min (ref >60)
Glucose, Bld: 84 mg/dL (ref 65–99)
Potassium: 4.4 mmol/L (ref 3.5–5.1)
Sodium: 137 mmol/L (ref 135–145)

## 2016-12-26 LAB — PHOSPHORUS: Phosphorus: 1.8 mg/dL — ABNORMAL LOW (ref 2.5–4.6)

## 2016-12-26 LAB — GLUCOSE, CAPILLARY: Glucose-Capillary: 83 mg/dL (ref 65–99)

## 2016-12-26 LAB — MAGNESIUM: Magnesium: 2.2 mg/dL (ref 1.7–2.4)

## 2016-12-26 MED ORDER — ALPRAZOLAM 0.5 MG PO TABS: 0.5000 mg | ORAL_TABLET | Freq: Three times a day (TID) | ORAL | 0 refills | Status: AC | PRN

## 2016-12-26 MED ORDER — K PHOS MONO-SOD PHOS DI & MONO 155-852-130 MG PO TABS
500.0000 mg | ORAL_TABLET | ORAL | Status: DC
  Administered 2016-12-26: 500 mg via ORAL
  Filled 2016-12-26: qty 2

## 2016-12-26 MED ORDER — ENSURE ENLIVE PO LIQD: 237.0000 mL | Freq: Two times a day (BID) | ORAL | 12 refills | Status: AC

## 2016-12-26 MED ORDER — ALBUTEROL SULFATE (2.5 MG/3ML) 0.083% IN NEBU: 2.5000 mg | INHALATION_SOLUTION | Freq: Four times a day (QID) | RESPIRATORY_TRACT | 1 refills | Status: AC | PRN

## 2016-12-26 MED ORDER — CALCIUM CARBONATE ANTACID 500 MG PO CHEW
500.0000 mg | CHEWABLE_TABLET | Freq: Two times a day (BID) | ORAL | Status: DC
  Filled 2016-12-26: qty 1

## 2016-12-26 MED ORDER — PREDNISONE 10 MG PO TABS: 10.0000 mg | ORAL_TABLET | Freq: Every day | ORAL | 0 refills | Status: AC

## 2016-12-26 MED ORDER — POLYETHYLENE GLYCOL 3350 17 G PO PACK: 17.0000 g | PACK | Freq: Every day | ORAL | 0 refills | Status: AC

## 2016-12-26 MED ORDER — MORPHINE SULFATE (CONCENTRATE) 10 MG /0.5 ML PO SOLN: 5.0000 mg | ORAL | 0 refills | Status: AC | PRN

## 2016-12-26 MED ORDER — DICYCLOMINE HCL 10 MG PO CAPS: 10.0000 mg | ORAL_CAPSULE | Freq: Three times a day (TID) | ORAL | 0 refills | Status: DC

## 2016-12-26 MED ORDER — METOCLOPRAMIDE HCL 5 MG PO TABS: 5.0000 mg | ORAL_TABLET | Freq: Three times a day (TID) | ORAL | 0 refills | Status: AC

## 2016-12-26 MED ORDER — PROMETHAZINE HCL 12.5 MG PO TABS: 12.5000 mg | ORAL_TABLET | Freq: Four times a day (QID) | ORAL | 0 refills | Status: AC | PRN

## 2016-12-26 MED ORDER — SIMETHICONE 80 MG PO CHEW: 80.0000 mg | CHEWABLE_TABLET | Freq: Three times a day (TID) | ORAL | 0 refills | Status: AC

## 2016-12-26 MED ORDER — DICYCLOMINE HCL 10 MG PO CAPS: 10.0000 mg | ORAL_CAPSULE | Freq: Three times a day (TID) | ORAL | 0 refills | Status: AC

## 2016-12-26 NOTE — Discharge Summary (Addendum)
[place] at [place] NAME: Kelli Morris    MR#:  [id number]  DATE OF BIRTH:  1951-09-28  DATE OF ADMISSION:  12/16/2016 ADMITTING PHYSICIAN: Dustin Flock, MD  DATE OF DISCHARGE: 12/26/2016  PRIMARY CARE PHYSICIAN: Tommi Rumps, MD    ADMISSION DIAGNOSIS:  Acute respiratory failure (HCC) [J96.00] Acute respiratory distress [R06.03] Pulmonary fibrosis (HCC) [J84.10]  DISCHARGE DIAGNOSIS:  Active Problems:   Acute respiratory failure (HCC)   Pulmonary fibrosis ([place])   Palliative care by specialist   DNR (do not resuscitate) discussion   Goals of care, counseling/discussion   SECONDARY DIAGNOSIS:   Past Medical History:  Diagnosis Date  . Arrhythmia    afib  . Cancer ([place])    uterine  . Fibromyalgia    Chronic fatigue  . GERD (gastroesophageal reflux disease)   . Headache(784.0)   . History of headache   . History of low back pain   . Hyperlipidemia   . Hypertension   . Hypothyroidism   . Idiopathic pulmonary fibrosis ([place])   . Lyme disease   . Pulmonary fibrosis ([place])   . PVD (peripheral vascular disease) ([place])   . Strain, cervical   . Thrombosis   . Tobacco use     HOSPITAL COURSE:  66 year old female with idiopathic pulmonary fibrosis who presents with acute hypoxic respiratory failure due to worsening of underlying pulmonary fibrosis.  1. Acute hypoxic respiratory failure-in the setting of progressive pulmonary fibrosis with superimposed interstitial pneumonia: Patient was placed on high flow nasal cannula and now has been weaned to her baseline nasal cannula. When she exerts herself her oxygen level needs to be adjusted on her oxygen taking.  She does not need antibiotics as she has completed course of treatment for pneumonia.  She will continue steroid taper and is on 10 mg of prednisone daily at home.   Patient has an appointment to follow-up at [facility] transplant team in February  2. Acute lower abd pain  with nause and vomitting: UGI series did not show stenosis/obsturction She is started on Phenergan PRN and /Reglan as per [employer] consultants which has helped. She will continue with low residual and low fructose Soft diet   3. Hypothyroidism: Continue Synthroid  4. Depression: Continue Zoloft and Xanax when necessary for anxiety  5Hyperlipidemia: Continue atorvastatin   DISCHARGE CONDITIONS AND DIET:   Guarded condition Low residual soft diet  CONSULTS OBTAINED:  Treatment Team:  Lollie Sails, MD  DRUG ALLERGIES:  No Known Allergies  DISCHARGE MEDICATIONS:   Current Discharge Medication List    START taking these medications   Details  dicyclomine (BENTYL) 10 MG capsule Take 1 capsule (10 mg total) by mouth 3 (three) times daily before meals. Qty: 90 capsule, Refills: 0    feeding supplement, ENSURE ENLIVE, (ENSURE ENLIVE) LIQD Take 237 mLs by mouth 2 (two) times daily between meals. Qty: 237 mL, Refills: 12    metoCLOPramide (REGLAN) 5 MG tablet Take 1 tablet (5 mg total) by mouth 4 (four) times daily -  before meals and at bedtime. Qty: 90 tablet, Refills: 0    Morphine Sulfate (MORPHINE CONCENTRATE) 10 mg / 0.5 ml concentrated solution Take 0.25 mLs (5 mg total) by mouth every 2 (two) hours as needed for severe pain or shortness of breath. Qty: 30 mL, Refills: 0    polyethylene glycol (MIRALAX / GLYCOLAX) packet Take 17 g by mouth daily. Qty: 14 each, Refills: 0    promethazine (PHENERGAN) 12.5  MG tablet Take 1 tablet (12.5 mg total) by mouth every 6 (six) hours as needed for nausea or vomiting. Qty: 30 tablet, Refills: 0    simethicone (MYLICON) 80 MG chewable tablet Chew 1 tablet (80 mg total) by mouth 3 (three) times daily. Qty: 30 tablet, Refills: 0      CONTINUE these medications which have CHANGED   Details  ALPRAZolam (XANAX) 0.5 MG tablet Take 1 tablet (0.5 mg total) by mouth 3 (three) times daily as needed for anxiety. Qty: 30 tablet, Refills: 0     predniSONE (DELTASONE) 10 MG tablet Take 1 tablet (10 mg total) by mouth daily with breakfast. 40 mg PO (ORAL)  x 2 days 30 mg PO  (ORAL)  x 2 days 20 mg PO  (ORAL) x 2 days 10 mg PO  (ORAL) daily Qty: 42 tablet, Refills: 0      CONTINUE these medications which have NOT CHANGED   Details  acetaminophen (TYLENOL) 325 MG tablet Take 650 mg by mouth every 6 (six) hours as needed.    albuterol (PROVENTIL HFA;VENTOLIN HFA) 108 (90 Base) MCG/ACT inhaler Inhale 1-2 puffs into the lungs every 6 (six) hours as needed for wheezing or shortness of breath.    Aspirin-Caffeine (BC FAST PAIN RELIEF ARTHRITIS) 1000-65 MG PACK Take 800 mg by mouth 4 (four) times daily.     atorvastatin (LIPITOR) 40 MG tablet TAKE 1 TABLET (40 MG TOTAL) BY MOUTH DAILY. Qty: 30 tablet, Refills: 11    levothyroxine (SYNTHROID, LEVOTHROID) 25 MCG tablet Take 1 tablet (25 mcg total) by mouth daily before breakfast. Qty: 90 tablet, Refills: 1    pantoprazole (PROTONIX) 40 MG tablet Take 40 mg by mouth 2 (two) times daily.    sertraline (ZOLOFT) 50 MG tablet TAKE ONE TABLET BY MOUTH DAILY Qty: 90 tablet, Refills: 1      STOP taking these medications     azithromycin (ZITHROMAX) 250 MG tablet      ibuprofen (ADVIL,MOTRIN) 200 MG tablet               Today   CHIEF COMPLAINT:   No acute issues overnight. Patient would like to go home today with hospice. Patient is tolerating a diet   VITAL SIGNS:  Blood pressure (!) 101/54, pulse (!) 103, temperature 98.6 F (37 C), temperature source Oral, resp. rate 20, height 5\' 3"  (1.6 m), weight 82.4 kg (181 lb 11.2 oz), SpO2 (!) 88 %.   REVIEW OF SYSTEMS:  Review of Systems  Constitutional: Negative.  Negative for chills, fever and malaise/fatigue.  HENT: Negative.  Negative for ear discharge, ear pain, hearing loss, nosebleeds and sore throat.   Eyes: Negative.  Negative for blurred vision and pain.  Respiratory: Positive for shortness of breath.  Negative for cough, hemoptysis and wheezing.   Cardiovascular: Negative.  Negative for chest pain, palpitations and leg swelling.  Gastrointestinal: Negative.  Negative for abdominal pain, blood in stool, diarrhea, nausea and vomiting.  Genitourinary: Negative.  Negative for dysuria.  Musculoskeletal: Negative.  Negative for back pain.  Skin: Negative.   Neurological: Negative for dizziness, tremors, speech change, focal weakness, seizures and headaches.  Endo/Heme/Allergies: Negative.  Does not bruise/bleed easily.  Psychiatric/Behavioral: Negative.  Negative for depression, hallucinations and suicidal ideas.     PHYSICAL EXAMINATION:  GENERAL:  66 y.o.-year-old patient lying in the bed with no acute distress.  NECK:  Supple, no jugular venous distention. No thyroid enlargement, no tenderness.  LUNGS: Fine bilateral and symmetric crackles  no wheezing, rales,rhonchi  No use of accessory muscles of respiration.  CARDIOVASCULAR: S1, S2 normal. No murmurs, rubs, or gallops.  ABDOMEN: Soft, non-tender, non-distended. Bowel sounds present. No organomegaly or mass.  EXTREMITIES: No pedal edema, cyanosis, or clubbing.  PSYCHIATRIC: The patient is alert and oriented x 3.  SKIN: No obvious rash, lesion, or ulcer.   DATA REVIEW:   CBC  Recent Labs Lab 12/26/16 0454  WBC 13.0*  HGB 10.2*  HCT 30.4*  PLT 310    Chemistries   Recent Labs Lab 12/22/16 1304  12/26/16 0454  NA 136  < > 137  K 4.0  < > 4.4  CL 102  < > 101  CO2 26  < > 28  GLUCOSE 103*  < > 84  BUN 18  < > 10  CREATININE 0.85  < > 0.63  CALCIUM 7.6*  < > 6.8*  MG  --   --  2.2  AST 24  --   --   ALT 22  --   --   ALKPHOS 73  --   --   BILITOT 0.3  --   --   < > = values in this interval not displayed.  Cardiac Enzymes No results for input(s): TROPONINI in the last 168 hours.  Microbiology Results  @[email or web address] @  RADIOLOGY:  No results found.    Management plans discussed with the patient and she is  in agreement. Stable for discharge home with hospice  Patient should follow up with hospice  CODE STATUS:     Code Status Orders        Start     Ordered   12/17/16 1518  Limited resuscitation (code)  Continuous    Question Answer Comment  In the event of cardiac or respiratory ARREST: Initiate Code Blue, Call Rapid Response Yes   In the event of cardiac or respiratory ARREST: Perform CPR Yes   In the event of cardiac or respiratory ARREST: Perform Intubation/Mechanical Ventilation No   In the event of cardiac or respiratory ARREST: Use NIPPV/BiPAp only if indicated Yes   In the event of cardiac or respiratory ARREST: Administer ACLS medications if indicated Yes   In the event of cardiac or respiratory ARREST: Perform Defibrillation or Cardioversion if indicated Yes      12/17/16 1517    Code Status History    Date Active Date Inactive Code Status Order ID Comments User Context   12/16/2016  4:01 PM 12/17/2016  3:17 PM Full Code [id number]  Dustin Flock, MD Inpatient   12/16/2016  1:29 PM 12/16/2016  4:01 PM Partial Code [id number]  Dustin Flock, MD ED   11/25/2016  5:12 PM 11/27/2016  1:23 PM Partial Code [id number]  Bettey Costa, MD Inpatient    Advance Directive Documentation   Flowsheet Row Most Recent Value  Type of Advance Directive  Living will  Pre-existing out of facility DNR order (yellow form or pink MOST form)  No data  "MOST" Form in Place?  No data      TOTAL TIME TAKING CARE OF THIS PATIENT: 38 minutes.    Note: This dictation was prepared with Dragon dictation along with smaller phrase technology. Any transcriptional errors that result from this process are unintentional.  Clotee Schlicker M.D on 12/26/2016 at 9:13 AM  Between 7am to 6pm - Pager - [phone] After 6pm go to [email or web address] - password [email or web address] [place] [facility]  Office  [phone]  CC: Primary care physician; Tommi Rumps,  MD

## 2016-12-26 NOTE — Care Management Important Message (Signed)
Important Message  Patient Details  Name: Kelli Morris MRN: [id number] Date of Birth: 24-Sep-1951   Medicare Important Message Given:  Yes    Shelbie Ammons, RN 12/26/2016, 8:41 AM

## 2016-12-26 NOTE — Progress Notes (Signed)
Discharge instructions given and went over with patient and daughter at bedside. Prescriptions given. All questions answered. Patient discharged home on 6L-O2 with hospice services in place and family via wheelchair by nursing staff. Madlyn Frankel, RN

## 2016-12-26 NOTE — Progress Notes (Signed)
MEDICATION RELATED CONSULT NOTE - INITIAL   Pharmacy Consult for electrolyte replacement Indication: hypophosphatemia  No Known Allergies  Patient Measurements: Height: 5\' 3"  (160 cm) Weight: 181 lb 11.2 oz (82.4 kg) IBW/kg (Calculated) : 52.4 Adjusted Body Weight:   Vital Signs: Temp: 98.3 F (36.8 C) (01/22 0435) Temp Source: Oral (01/22 0435) BP: 129/76 (01/22 0435) Pulse Rate: 95 (01/22 0435) Intake/Output from previous day: 01/21 0701 - 01/22 0700 In: 240 [P.O.:240] Out: 1100 [Urine:1100] Intake/Output from this shift: Total I/O In: -  Out: 650 [Urine:650]  Labs:  Recent Labs  12/24/16 0426 12/26/16 0454  WBC  --  13.0*  HGB  --  10.2*  HCT  --  30.4*  PLT  --  310  CREATININE 0.83 0.63  MG  --  2.2  PHOS  --  1.8*   Estimated Creatinine Clearance: 71.3 mL/min (by C-G formula based on SCr of 0.63 mg/dL).   Microbiology: Recent Results (from the past 720 hour(s))  Culture, expectorated sputum-assessment     Status: None   Collection Time: 12/16/16  1:09 PM  Result Value Ref Range Status   Specimen Description SPUTUM  Final   Special Requests NONE  Final   Sputum evaluation THIS SPECIMEN IS ACCEPTABLE FOR SPUTUM CULTURE  Final   Report Status 12/16/2016 FINAL  Final  Culture, respiratory (NON-Expectorated)     Status: None   Collection Time: 12/16/16  1:09 PM  Result Value Ref Range Status   Specimen Description SPUTUM  Final   Special Requests NONE Reflexed from [id number]  Final   Gram Stain   Final    RARE WBC PRESENT, PREDOMINANTLY PMN MODERATE GRAM NEGATIVE RODS MODERATE GRAM POSITIVE COCCI IN PAIRS RARE SQUAMOUS EPITHELIAL CELLS PRESENT    Culture   Final    Consistent with normal respiratory flora. Performed at [facility]    Report Status 12/19/2016 FINAL  Final  MRSA PCR Screening     Status: None   Collection Time: 12/16/16  1:54 PM  Result Value Ref Range Status   MRSA by PCR NEGATIVE NEGATIVE Final    Comment:        The  GeneXpert MRSA Assay (FDA approved for NASAL specimens only), is one component of a comprehensive MRSA colonization surveillance program. It is not intended to diagnose MRSA infection nor to guide or monitor treatment for MRSA infections.   CULTURE, BLOOD (ROUTINE X 2) w Reflex to ID Panel     Status: None (Preliminary result)   Collection Time: 12/22/16  1:04 PM  Result Value Ref Range Status   Specimen Description BLOOD RIGHT HAND  Final   Special Requests   Final    BOTTLES DRAWN AEROBIC AND ANAEROBIC AER 6ML ANA 6ML   Culture NO GROWTH 3 DAYS  Final   Report Status PENDING  Incomplete  CULTURE, BLOOD (ROUTINE X 2) w Reflex to ID Panel     Status: None (Preliminary result)   Collection Time: 12/22/16  1:30 PM  Result Value Ref Range Status   Specimen Description BLOOD LEFT HAND  Final   Special Requests   Final    BOTTLES DRAWN AEROBIC AND ANAEROBIC AER 9ML ANA 7ML   Culture NO GROWTH 3 DAYS  Final   Report Status PENDING  Incomplete  Urine culture     Status: None   Collection Time: 12/22/16 10:10 PM  Result Value Ref Range Status   Specimen Description URINE, CLEAN CATCH  Final   Special Requests NONE  Final   Culture   Final    NO GROWTH Performed at [facility], [place] [address], [place], [place] [address]    Report Status 12/24/2016 FINAL  Final    Medical History: Past Medical History:  Diagnosis Date  . Arrhythmia    afib  . Cancer ([place])    uterine  . Fibromyalgia    Chronic fatigue  . GERD (gastroesophageal reflux disease)   . Headache(784.0)   . History of headache   . History of low back pain   . Hyperlipidemia   . Hypertension   . Hypothyroidism   . 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 Lyme disease   . Pulmonary fibrosis ([place])   . PVD (peripheral vascular disease) ([place])   . Strain, cervical   . Thrombosis   . Tobacco use     Medications:  Infusions:    Assessment: 56 yof admitted with respiratory failure/progressive pulmonary  fibrosis with interstitial pneumonia. Electrolytes noted to be low this AM, pharmacy consulted to dose electrolyte replacement.  Goal of Therapy:  Electrolytes WNL  Plan:  1. K Phos Neutral tablets, 2 tabs po Q4H x 4 doses 2. Calcium carbonate, 500 mg elemental calcium po BID with meals x 2 doses. 3. Recheck electrolytes with AM labs.   Laural Benes, Pharm.D., BCPS Clinical Pharmacist 12/26/2016,6:49 AM

## 2016-12-26 NOTE — Care Management (Signed)
Discharge to home today per Dr. Benjie Karvonen. Will be followed by [facility] in the home. Equipment was delivered over the weekend. Telephone call to Doreatha Lew, RN representative for [facility].   Spoke with Ms. Garza and daughter at the bedside. Would like to transport to home per private car. Discussed if she changes her mind just to let Care Management know. Discussed that family would need to bring oxygen tank to hospital                                                                                                              Shelbie Ammons RN MSN CCM Care Management

## 2016-12-26 NOTE — Telephone Encounter (Signed)
Received a call from team health regarding my patient. They were unable to contact the on call provider after several attempts. This patient is under hospice and the hospice nurse wanted to discuss the patients albuterol nebulizer medication. She was not discharged from the hospital with a prescription for albuterol nebulizer. They wanted to see if the patient could get her nebulizer sent to a pharmacy. The nurse reported that the [facility] in [place] on [address] was open until 10 pm and asked that I send a prescription there. This was done and the hospice nurse was going to call the patient to let them know to go pick this medication up tonight if possible.

## 2016-12-26 NOTE — Telephone Encounter (Signed)
Pt will discharge from [facility] on 01/22 for acute raspatory failure. Pt has been scheduled for a HFU

## 2016-12-26 NOTE — Progress Notes (Signed)
Follow up on new hospice referral.  Patient is for discharge home today.  Oxygen was delivered over the weekend.  Patient/family feel she can discharge via private vehicle.  Husband will bring portable tank to [facility].  Family request same day admission.  Hospice team notified and updated information faxed to referral intake.  Thank you for allowing participation in this patient's care.

## 2016-12-27 ENCOUNTER — Telehealth: Payer: Self-pay | Admitting: *Deleted

## 2016-12-27 ENCOUNTER — Other Ambulatory Visit: Payer: Self-pay | Admitting: *Deleted

## 2016-12-27 LAB — CULTURE, BLOOD (ROUTINE X 2)
Culture: NO GROWTH
Culture: NO GROWTH

## 2016-12-27 MED ORDER — MORPHINE SULFATE 20 MG/5ML PO SOLN: 10.0000 mg | ORAL | 0 refills | Status: DC | PRN

## 2016-12-27 NOTE — Telephone Encounter (Signed)
Spoke with Lattie Haw from hospice and she states pt was sent home on Morphine 5mg  po q2hrs and states it isn't helping the pt. Per DK give pt Morphine solution 20mg /34ml and order as 10mg  every 1 hour as needed for pain. Will print for Dr. Mortimer Fries to sign.   Called Lisa at [facility] and informed her. Will fax per Hospice to [facility].

## 2016-12-27 NOTE — Telephone Encounter (Signed)
Medication Management   Flora Lipps, MD  You 8 minutes ago (4:11 PM)       I advise NOT to check 02 sats anymore.  Use morphine as needed as prescribed      Documentation     You routed conversation to You 16 minutes ago (4:03 PM)    Lattie Haw [phone]  You 18 minutes ago (4:01 PM)    You routed conversation to Flora Lipps, MD 22 minutes ago (3:57 PM)    (Name not recorded)   You 24 minutes ago (3:55 PM)    You 29 minutes ago (3:50 PM)      Spoke with Lattie Haw from hospice and she states pt was sent home on Morphine 5mg  po q2hrs and states it isn't helping the pt. Per DK give pt Morphine solution 20mg /59ml and order as 10mg  every 1 hour as needed for pain. Will print for Dr. Mortimer Fries to sign.   Called Lisa at [facility] and informed her. Will fax per Hospice to [facility].      Documentation     Spoke with Lattie Haw and informed her per DK not to check sats any longer. Nothing further needed.

## 2016-12-27 NOTE — Telephone Encounter (Signed)
I advise NOT to check 02 sats anymore.  Use morphine as needed as prescribed

## 2016-12-28 ENCOUNTER — Other Ambulatory Visit: Payer: Self-pay | Admitting: *Deleted

## 2016-12-28 MED ORDER — MORPHINE SULFATE (CONCENTRATE) 10 MG /0.5 ML PO SOLN: 10.0000 mg | ORAL | 0 refills | Status: AC | PRN

## 2016-12-28 NOTE — Telephone Encounter (Signed)
Noted. Agree that she does not need to see me.

## 2016-12-28 NOTE — Telephone Encounter (Signed)
Called to speak with patient concerning HFU spoke with patient DPR , patient is bed ridden on morphine and has been admitted to Hospice care no TCM available and canceled follow up with PCP.

## 2016-12-29 ENCOUNTER — Telehealth: Payer: Self-pay | Admitting: Family Medicine

## 2016-12-29 NOTE — Telephone Encounter (Signed)
Tyrone Sage from Hospice called and stated that pt passed away on 2017/01/18 @ 9:45 pm.

## 2016-12-29 NOTE — Telephone Encounter (Signed)
noted 

## 2016-12-30 ENCOUNTER — Ambulatory Visit: Payer: Self-pay | Admitting: Pulmonary Disease

## 2016-12-30 ENCOUNTER — Telehealth: Payer: Self-pay | Admitting: Family Medicine

## 2016-12-30 NOTE — Telephone Encounter (Signed)
noted 

## 2016-12-30 NOTE — Telephone Encounter (Signed)
PT's death certificate has been dropped off to be signed by Dr. Caryl Bis. Certificate is up front in Dr. Ellen Henri color folder.

## 2017-01-02 ENCOUNTER — Ambulatory Visit: Payer: Medicare Other | Admitting: Family Medicine

## 2017-01-02 ENCOUNTER — Ambulatory Visit: Payer: Self-pay | Admitting: Family Medicine

## 2017-01-02 ENCOUNTER — Encounter: Payer: Self-pay | Admitting: Respiratory Therapy

## 2017-01-02 DIAGNOSIS — J8410 Pulmonary fibrosis, unspecified: Secondary | ICD-10-CM

## 2017-01-02 NOTE — Progress Notes (Signed)
Pulmonary Individual Treatment Plan  Patient Details  Name: Kelli Morris MRN: [id number] Date of Birth: October 21, 1951 Referring Provider:   Flowsheet Row Pulmonary Rehab from 10/04/2016 in [facility] Cardiac and Pulmonary Rehab  Referring Provider  [place]      Initial Encounter Date:  Flowsheet Row Pulmonary Rehab from 10/04/2016 in [facility] Cardiac and Pulmonary Rehab  Date  10/04/16  Referring Provider  Simonds      Visit Diagnosis: Pulmonary fibrosis ([place])  Patient's Home Medications on Admission:  Current Outpatient Prescriptions:    acetaminophen (TYLENOL) 325 MG tablet, Take 650 mg by mouth every 6 (six) hours as needed., Disp: , Rfl:    albuterol (PROVENTIL HFA;VENTOLIN HFA) 108 (90 Base) MCG/ACT inhaler, Inhale 1-2 puffs into the lungs every 6 (six) hours as needed for wheezing or shortness of breath., Disp: , Rfl:    albuterol (PROVENTIL) (2.5 MG/3ML) 0.083% nebulizer solution, Take 3 mLs (2.5 mg total) by nebulization every 6 (six) hours as needed for wheezing or shortness of breath., Disp: 150 mL, Rfl: 1   ALPRAZolam (XANAX) 0.5 MG tablet, Take 1 tablet (0.5 mg total) by mouth 3 (three) times daily as needed for anxiety., Disp: 30 tablet, Rfl: 0   Aspirin-Caffeine (BC FAST PAIN RELIEF ARTHRITIS) 1000-65 MG PACK, Take 800 mg by mouth 4 (four) times daily. , Disp: , Rfl:    atorvastatin (LIPITOR) 40 MG tablet, TAKE 1 TABLET (40 MG TOTAL) BY MOUTH DAILY., Disp: 30 tablet, Rfl: 11   dicyclomine (BENTYL) 10 MG capsule, Take 1 capsule (10 mg total) by mouth 3 (three) times daily before meals., Disp: 90 capsule, Rfl: 0   feeding supplement, ENSURE ENLIVE, (ENSURE ENLIVE) LIQD, Take 237 mLs by mouth 2 (two) times daily between meals., Disp: 237 mL, Rfl: 12   levothyroxine (SYNTHROID, LEVOTHROID) 25 MCG tablet, Take 1 tablet (25 mcg total) by mouth daily before breakfast., Disp: 90 tablet, Rfl: 1   metoCLOPramide (REGLAN) 5 MG tablet, Take 1 tablet (5 mg total) by mouth 4 (four) times  daily -  before meals and at bedtime., Disp: 90 tablet, Rfl: 0   Morphine Sulfate (MORPHINE CONCENTRATE) 10 mg / 0.5 ml concentrated solution, Take 0.25 mLs (5 mg total) by mouth every 2 (two) hours as needed for severe pain or shortness of breath., Disp: 30 mL, Rfl: 0   Morphine Sulfate (MORPHINE CONCENTRATE) 10 mg / 0.5 ml concentrated solution, Take 0.5 mLs (10 mg total) by mouth every hour as needed for severe pain., Disp: 120 mL, Rfl: 0   pantoprazole (PROTONIX) 40 MG tablet, Take 40 mg by mouth 2 (two) times daily., Disp: , Rfl:    polyethylene glycol (MIRALAX / GLYCOLAX) packet, Take 17 g by mouth daily., Disp: 14 each, Rfl: 0   predniSONE (DELTASONE) 10 MG tablet, Take 1 tablet (10 mg total) by mouth daily with breakfast. 40 mg PO (ORAL)  x 2 days 30 mg PO  (ORAL)  x 2 days 20 mg PO  (ORAL) x 2 days 10 mg PO  (ORAL) daily, Disp: 42 tablet, Rfl: 0   promethazine (PHENERGAN) 12.5 MG tablet, Take 1 tablet (12.5 mg total) by mouth every 6 (six) hours as needed for nausea or vomiting., Disp: 30 tablet, Rfl: 0   sertraline (ZOLOFT) 50 MG tablet, TAKE ONE TABLET BY MOUTH DAILY, Disp: 90 tablet, Rfl: 1   simethicone (MYLICON) 80 MG chewable tablet, Chew 1 tablet (80 mg total) by mouth 3 (three) times daily., Disp: 30 tablet, Rfl: 0  Past  Medical History: Past Medical History:  Diagnosis Date   Arrhythmia    afib   Cancer ([place])    uterine   Fibromyalgia    Chronic fatigue   GERD (gastroesophageal reflux disease)    Headache(784.0)    History of headache    History of low back pain    Hyperlipidemia    Hypertension    Hypothyroidism    Idiopathic pulmonary fibrosis (HCC)    Lyme disease    Pulmonary fibrosis (HCC)    PVD (peripheral vascular disease) (HCC)    Strain, cervical    Thrombosis    Tobacco use     Tobacco Use: History  Smoking Status   Former Smoker   Packs/day: 0.50   Years: 45.00   Types: Cigarettes  Smokeless Tobacco   Never Used     Labs: Recent [employer] for ITP Cardiac and Pulmonary Rehab Latest Ref Rng & Units 12/15/2015 02/04/2016 02/10/2016   Cholestrol 100 - 199 mg/dL 207(H) 172 -   LDLCALC 0 - 99 mg/dL 149(H) 115(H) -   HDL >39 mg/dL 44 45 -   Trlycerides 0 - 149 mg/dL 70 62 -   Hemoglobin A1c 4.6 - 6.5 % - - 6.1       ADL UCSD:     Pulmonary Assessment Scores    Row Name 10/04/16 1213         ADL UCSD   SOB Score total 24     Rest 0     Walk 1     Stairs 3     Bath 0     Dress 1     Shop 1        Pulmonary Function Assessment:     Pulmonary Function Assessment - 10/04/16 1210      Pulmonary Function Tests   FVC% 72 %   FEV1% 79 %   FEV1/FVC Ratio 86   RV% 45 %   DLCO% 52 %     Breath   Bilateral Breath Sounds Decreased;Rales;Basilar   Shortness of Breath Yes;Limiting activity      Exercise Target Goals:    Exercise Program Goal: Individual exercise prescription set with THRR, safety & activity barriers. Participant demonstrates ability to understand and report RPE using BORG scale, to self-measure pulse accurately, and to acknowledge the importance of the exercise prescription.  Exercise Prescription Goal: Starting with aerobic activity 30 plus minutes a day, 3 days per week for initial exercise prescription. Provide home exercise prescription and guidelines that participant acknowledges understanding prior to discharge.  Activity Barriers & Risk Stratification:     Activity Barriers & Cardiac Risk Stratification - 10/04/16 1209      Activity Barriers & Cardiac Risk Stratification   Activity Barriers Back Problems;Deconditioning;Joint Problems   Cardiac Risk Stratification Moderate      6 Minute Walk:     6 Minute Walk    Row Name 10/04/16 1220         6 Minute Walk   Distance 1450 feet     Walk Time 6 minutes     # of Rest Breaks 0     MPH 2.75     METS 3.3     RPE 8     Perceived Dyspnea  2     VO2 Peak 11.55     Symptoms No      Resting HR 73 bpm     Resting BP 112/62  Max Ex. HR 106 bpm     Max Ex. BP 142/74       Interval HR   Baseline HR 73     1 Minute HR 101     2 Minute HR 105     3 Minute HR 101     4 Minute HR 106     5 Minute HR 102     6 Minute HR 101     2 Minute Post HR 80     Interval Heart Rate? Yes       Interval Oxygen   Interval Oxygen? Yes     Baseline Oxygen Saturation % 97 %     1 Minute Oxygen Saturation % 94 %     2 Minute Oxygen Saturation % 92 %     3 Minute Oxygen Saturation % 95 %     4 Minute Oxygen Saturation % 92 %     5 Minute Oxygen Saturation % 91 %     6 Minute Oxygen Saturation % 94 %     2 Minute Post Oxygen Saturation % 97 %        Initial Exercise Prescription:     Initial Exercise Prescription - 10/04/16 1200      Date of Initial Exercise RX and Referring Provider   Date 10/04/16   Referring Provider Simonds     Treadmill   MPH 2   Grade 2.5   Minutes 15   METs 3     Elliptical   Level 1   Speed 2.5   Minutes 15   METs 3     Prescription Details   Frequency (times per week) 3   Duration Progress to 45 minutes of aerobic exercise without signs/symptoms of physical distress     Intensity   THRR 40-80% of Max Heartrate 106-139   Ratings of Perceived Exertion 11-13   Perceived Dyspnea 0-4     Progression   Progression Continue to progress workloads to maintain intensity without signs/symptoms of physical distress.     Resistance Training   Training Prescription Yes   Weight 3   Reps 10-15      Perform Capillary Blood Glucose checks as needed.  Exercise Prescription Changes:     Exercise Prescription Changes    Row Name 10/10/16 1200 10/25/16 1500           Exercise Review   Progression  -- Yes        Response to Exercise   Blood Pressure (Admit) 130/70 132/78      Blood Pressure (Exercise)  -- 152/82      Blood Pressure (Exit) 124/72 118/80      Heart Rate (Admit) 72 bpm 82 bpm      Heart Rate (Exercise) 122 bpm 155  bpm      Heart Rate (Exit) 81 bpm 80 bpm      Oxygen Saturation (Admit) 98 % 96 %      Oxygen Saturation (Exercise) 92 % 90 %      Oxygen Saturation (Exit) 96 % 97 %      Rating of Perceived Exertion (Exercise) 13 12      Perceived Dyspnea (Exercise) 3 3      Duration  -- Progress to 45 minutes of aerobic exercise without signs/symptoms of physical distress      Intensity  -- THRR unchanged        Resistance Training   Training Prescription Yes Yes  Weight 3 3      Reps 10-15 10-15        Interval Training   Interval Training  -- No        Treadmill   MPH 2 2.5      Grade 2.5 2.5      Minutes 15 15      METs 3 3.78        Elliptical   Level 1 1      Speed 2.5 3.1      Minutes 15 15      METs 3  --         Exercise Comments:     Exercise Comments    Row Name 10/10/16 1237 10/25/16 1558         Exercise Comments First full day of exercise!  Patient was oriented to gym and equipment including functions, settings, policies, and procedures.  Patient's individual exercise prescription and treatment plan were reviewed.  All starting workloads were established based on the results of the 6 minute walk test done at initial orientation visit.  The plan for exercise progression was also introduced and progression will be customized based on patient's performance and goals Kollins has done well in her first weeks of exercise.         Discharge Exercise Prescription (Final Exercise Prescription Changes):     Exercise Prescription Changes - 10/25/16 1500      Exercise Review   Progression Yes     Response to Exercise   Blood Pressure (Admit) 132/78   Blood Pressure (Exercise) 152/82   Blood Pressure (Exit) 118/80   Heart Rate (Admit) 82 bpm   Heart Rate (Exercise) 155 bpm   Heart Rate (Exit) 80 bpm   Oxygen Saturation (Admit) 96 %   Oxygen Saturation (Exercise) 90 %   Oxygen Saturation (Exit) 97 %   Rating of Perceived Exertion (Exercise) 12   Perceived Dyspnea  (Exercise) 3   Duration Progress to 45 minutes of aerobic exercise without signs/symptoms of physical distress   Intensity THRR unchanged     Resistance Training   Training Prescription Yes   Weight 3   Reps 10-15     Interval Training   Interval Training No     Treadmill   MPH 2.5   Grade 2.5   Minutes 15   METs 3.78     Elliptical   Level 1   Speed 3.1   Minutes 15       Nutrition:  Target Goals: Understanding of nutrition guidelines, daily intake of sodium <1595m, cholesterol <2038m calories 30% from fat and 7% or less from saturated fats, daily to have 5 or more servings of fruits and vegetables.  Biometrics:     Pre Biometrics - 10/04/16 1213      Pre Biometrics   Height 5' 3.5" (1.613 m)   Weight 180 lb 12.8 oz (82 kg)   Waist Circumference 38.75 inches   Hip Circumference 43.5 inches   Waist to Hip Ratio 0.89 %   BMI (Calculated) 31.6       Nutrition Therapy Plan and Nutrition Goals:   Nutrition Discharge: Rate Your Plate Scores:   Psychosocial: Target Goals: Acknowledge presence or absence of depression, maximize coping skills, provide positive support system. Participant is able to verbalize types and ability to use techniques and skills needed for reducing stress and depression.  Initial Review & Psychosocial Screening:     Initial Psych Review & Screening - 10/04/16 1227  Family Dynamics   Good Support System? Yes   Comments Ms Shawhan is excited to start [place]. She has been recently diagnoised with Pulmonary Fibrosis and wants to learn all she can about the disease. She has been sent to [facility] and will have excellent support from her husband and two children.excellent support from from her      Barriers   Psychosocial barriers to participate in program The patient should benefit from training in stress management and relaxation.     Screening Interventions   Interventions Encouraged to exercise;Other (comment)    Comments Ms Zeiders is required to meet with a psychologist by the Transplant Team.      Quality of Life Scores:     Quality of Life - 10/04/16 1233      Quality of Life Scores   Health/Function Pre 20.56 %   Socioeconomic Pre 19.63 %   Psych/Spiritual Pre 21 %   Family Pre 21 %   GLOBAL Pre 20.5 %      PHQ-9: Recent Review Flowsheet Data    Depression screen [facility] 2/9 10/04/2016 08/15/2016   Decreased Interest 0 1   Down, Depressed, Hopeless 0 1   PHQ - 2 Score 0 2   Altered sleeping 2 3   Tired, decreased energy 2 2   Change in appetite 0 1   Feeling bad or failure about yourself  0 1   Trouble concentrating 0 1   Moving slowly or fidgety/restless 0 1   Suicidal thoughts 0 0   PHQ-9 Score 4 11   Difficult doing work/chores Not difficult at all Somewhat difficult      Psychosocial Evaluation and Intervention:     Psychosocial Evaluation - 10/19/16 1150      Psychosocial Evaluation & Interventions   Comments Counselor met with Ms. Schwalbe today for initial psychosocial evaluation.  She is a 66 year old diagnosed with IPF about 18 months ago.  She has a spouse of 30 years; an adult son and daughter who live close by; as well as a sister and "lots of cousins."  She has some difficulty sleeping with negative side effects of a medication that results in diarrhea.  Her Dr. is changing this to hopefully a new medication to help with these negative side effects.  She reports a good appetite and no history of depression or anxiety or any current symptoms.  She stated her mood is typically positive; but her Dr. mentioned her "seeing a psychologist" to help with coping with IPF and the impact it has on her and her family.  These are also her primary stressors; as well as finances at this time.  Ms. Dost desires to be stronger and more prepared to live with this diagnosis in every way.  She wants to increase her activity level and is [facility] of [facility].  Staff wil be following with Ms. M throughout the course of this program.       Psychosocial Re-Evaluation:     Psychosocial Re-Evaluation    Row Name 10/19/16 1151 10/24/16 1112           Psychosocial Re-Evaluation   Comments Follow up with Ms. Jerilynn Mages stating she is happy to finally be "moving and doing something" compared to her life before this class.  She is sleeping more soundly now; but still gets up and down with the medication side effects of diarrhea.  She states  the holidays will be full of losses with her parents both dying over the past few years; but she chooses to focus on the good memories and building a house at the [place] to move into next Spring.  She plans to head to [place] over the Thanksgiving holidays and is looking forward to this time with family.   Counselor follow up with Ms. Jerilynn Mages stating she is doing much better now that the Dr. has allowed her to temporarily stop the medication that was causing severe diarrhea.  She has been instructed to start back in a few days taking only 1/2 dose at that time.  She reports having more energy and sleeping better as a result.  She has increased her stamina since coming into this program and reports more energy recently. She continues to be in a positive mood and reports minimal stress at this time. Counselor will continue to follow with Ms. Jerilynn Mages.        Education: Education Goals: Education classes will be provided on a weekly basis, covering required topics. Participant will state understanding/return demonstration of topics presented.  Learning Barriers/Preferences:     Learning Barriers/Preferences - 10/04/16 1210      Learning Barriers/Preferences   Learning Barriers None   Learning Preferences None      Education Topics: Initial Evaluation Education: - Verbal, written and demonstration of respiratory meds, RPE/PD scales, oximetry and breathing techniques. Instruction on use of nebulizers  and MDIs: cleaning and proper use, rinsing mouth with steroid doses and importance of monitoring MDI activations. Flowsheet Row Pulmonary Rehab from 10/24/2016 in [facility] Cardiac and Pulmonary Rehab  Date  10/04/16  Educator  LB  Instruction Review Code  2- meets goals/outcomes      General Nutrition Guidelines/Fats and Fiber: -Group instruction provided by verbal, written material, models and posters to present the general guidelines for heart healthy nutrition. Gives an explanation and review of dietary fats and fiber. Flowsheet Row Pulmonary Rehab from 10/24/2016 in [facility] Cardiac and Pulmonary Rehab  Date  10/24/16  Educator  Cr  Instruction Review Code  2- meets goals/outcomes      Controlling Sodium/Reading Food Labels: -Group verbal and written material supporting the discussion of sodium use in heart healthy nutrition. Review and explanation with models, verbal and written materials for utilization of the food label.   Exercise Physiology & Risk Factors: - Group verbal and written instruction with models to review the exercise physiology of the cardiovascular system and associated critical values. Details cardiovascular disease risk factors and the goals associated with each risk factor. Flowsheet Row Pulmonary Rehab from 10/24/2016 in [facility] Cardiac and Pulmonary Rehab  Date  10/19/16  Educator  AS  Instruction Review Code  2- meets goals/outcomes      Aerobic Exercise & Resistance Training: - Gives group verbal and written discussion on the health impact of inactivity. On the components of aerobic and resistive training programs and the benefits of this training and how to safely progress through these programs.   Flexibility, Balance, General Exercise Guidelines: - Provides group verbal and written instruction on the benefits of flexibility and balance training programs. Provides general exercise guidelines with specific guidelines to those with heart or lung disease.  Demonstration and skill practice provided.   Stress Management: - Provides group verbal and written instruction about the health risks of elevated stress, cause of high stress, and healthy ways to reduce stress.   Depression: - Provides group verbal and written instruction on the correlation between heart/lung  disease and depressed mood, treatment options, and the stigmas associated with seeking treatment.   Exercise & Equipment Safety: - Individual verbal instruction and demonstration of equipment use and safety with use of the equipment. Flowsheet Row Pulmonary Rehab from 10/24/2016 in [facility] Cardiac and Pulmonary Rehab  Date  10/10/16  Educator  AS  Instruction Review Code  2- meets goals/outcomes      Infection Prevention: - Provides verbal and written material to individual with discussion of infection control including proper hand washing and proper equipment cleaning during exercise session. Flowsheet Row Pulmonary Rehab from 10/24/2016 in [facility] Cardiac and Pulmonary Rehab  Date  10/10/16  Educator  AS  Instruction Review Code  2- meets goals/outcomes      Falls Prevention: - Provides verbal and written material to individual with discussion of falls prevention and safety. Flowsheet Row Pulmonary Rehab from 10/24/2016 in [facility] Cardiac and Pulmonary Rehab  Date  10/04/16  Educator  LB  Instruction Review Code  2- meets goals/outcomes      Diabetes: - Individual verbal and written instruction to review signs/symptoms of diabetes, desired ranges of glucose level fasting, after meals and with exercise. Advice that pre and post exercise glucose checks will be done for 3 sessions at entry of program.   Chronic Lung Diseases: - Group verbal and written instruction to review new updates, new respiratory medications, new advancements in procedures and treatments. Provide informative websites and "800" numbers of self-education.   Lung Procedures: - Group verbal and written  instruction to describe testing methods done to diagnose lung disease. Review the outcome of test results. Describe the treatment choices: Pulmonary Function Tests, ABGs and oximetry.   Energy Conservation: - Provide group verbal and written instruction for methods to conserve energy, plan and organize activities. Instruct on pacing techniques, use of adaptive equipment and posture/positioning to relieve shortness of breath.   Triggers: - Group verbal and written instruction to review types of environmental controls: home humidity, furnaces, filters, dust mite/pet prevention, HEPA vacuums. To discuss weather changes, air quality and the benefits of nasal washing.   Exacerbations: - Group verbal and written instruction to provide: warning signs, infection symptoms, calling MD promptly, preventive modes, and value of vaccinations. Review: effective airway clearance, coughing and/or vibration techniques. Create an Sports administrator.   Oxygen: - Individual and group verbal and written instruction on oxygen therapy. Includes supplement oxygen, available portable oxygen systems, continuous and intermittent flow rates, oxygen safety, concentrators, and Medicare reimbursement for oxygen.   Respiratory Medications: - Group verbal and written instruction to review medications for lung disease. Drug class, frequency, complications, importance of spacers, rinsing mouth after steroid MDI's, and proper cleaning methods for nebulizers.   AED/CPR: - Group verbal and written instruction with the use of models to demonstrate the basic use of the AED with the basic ABC's of resuscitation.   Breathing Retraining: - Provides individuals verbal and written instruction on purpose, frequency, and proper technique of diaphragmatic breathing and pursed-lipped breathing. Applies individual practice skills. Flowsheet Row Pulmonary Rehab from 10/24/2016 in [facility] Cardiac and Pulmonary Rehab  Date  10/10/16  Educator  AS   Instruction Review Code  2- meets goals/outcomes      Anatomy and Physiology of the Lungs: - Group verbal and written instruction with the use of models to provide basic lung anatomy and physiology related to function, structure and complications of lung disease.   Heart Failure: - Group verbal and written instruction on the basics of heart failure: signs/symptoms,  treatments, explanation of ejection fraction, enlarged heart and cardiomyopathy.   Sleep Apnea: - Individual verbal and written instruction to review Obstructive Sleep Apnea. Review of risk factors, methods for diagnosing and types of masks and machines for OSA.   Anxiety: - Provides group, verbal and written instruction on the correlation between heart/lung disease and anxiety, treatment options, and management of anxiety.   Relaxation: - Provides group, verbal and written instruction about the benefits of relaxation for patients with heart/lung disease. Also provides patients with examples of relaxation techniques.   Knowledge Questionnaire Score:     Knowledge Questionnaire Score - 10/04/16 1210      Knowledge Questionnaire Score   Pre Score 4/10       Core Components/Risk Factors/Patient Goals at Admission:     Personal Goals and Risk Factors at Admission - 10/04/16 1218      Core Components/Risk Factors/Patient Goals on Admission    Weight Management Yes;Weight Loss   Intervention Weight Management: Develop a combined nutrition and exercise program designed to reach desired caloric intake, while maintaining appropriate intake of nutrient and fiber, sodium and fats, and appropriate energy expenditure required for the weight goal.;Weight Management: Provide education and appropriate resources to help participant work on and attain dietary goals.;Weight Management/Obesity: Establish reasonable short term and long term weight goals.  Ms Willison will meet with the [facility] Transplant Dietitian every 6 months. She is  working on drinking more water and decrease soda intake.   Admit Weight 180 lb 12.8 oz (82 kg)   Goal Weight: Short Term 170 lb (77.1 kg)   Goal Weight: Long Term 140 lb (63.5 kg)   Expected Outcomes Short Term: Continue to assess and modify interventions until short term weight is achieved;Long Term: Adherence to nutrition and physical activity/exercise program aimed toward attainment of established weight goal;Weight Loss: Understanding of general recommendations for a balanced deficit meal plan, which promotes 1-2 lb weight loss per week and includes a negative energy balance of [phone] kcal/d;Understanding recommendations for meals to include 15-35% energy as protein, 25-35% energy from fat, 35-60% energy from carbohydrates, less than 281m of dietary cholesterol, 20-35 gm of total fiber daily;Understanding of distribution of calorie intake throughout the day with the consumption of 4-5 meals/snacks;Weight Maintenance: Understanding of the daily nutrition guidelines, which includes 25-35% calories from fat, 7% or less cal from saturated fats, less than 2048mcholesterol, less than 1.5gm of sodium, & 5 or more servings of fruits and vegetables daily   Sedentary Yes   Intervention Provide advice, education, support and counseling about physical activity/exercise needs.;Develop an individualized exercise prescription for aerobic and resistive training based on initial evaluation findings, risk stratification, comorbidities and participant's personal goals.   Expected Outcomes Achievement of increased cardiorespiratory fitness and enhanced flexibility, muscular endurance and strength shown through measurements of functional capacity and personal statement of participant.   Increase Strength and Stamina Yes   Intervention Provide advice, education, support and counseling about physical activity/exercise needs.;Develop an individualized exercise prescription for aerobic and resistive training based on  initial evaluation findings, risk stratification, comorbidities and participant's personal goals.   Expected Outcomes Achievement of increased cardiorespiratory fitness and enhanced flexibility, muscular endurance and strength shown through measurements of functional capacity and personal statement of participant.   Improve shortness of breath with ADL's Yes   Intervention Provide education, individualized exercise plan and daily activity instruction to help decrease symptoms of SOB with activities of daily living.   Expected Outcomes Short Term: Achieves a reduction of  symptoms when performing activities of daily living.   Develop more efficient breathing techniques such as purse lipped breathing and diaphragmatic breathing; and practicing self-pacing with activity Yes   Intervention Provide education, demonstration and support about specific breathing techniuqes utilized for more efficient breathing. Include techniques such as pursed lipped breathing, diaphragmatic breathing and self-pacing activity.   Expected Outcomes Short Term: Participant will be able to demonstrate and use breathing techniques as needed throughout daily activities.   Increase knowledge of respiratory medications and ability to use respiratory devices properly  Yes  Ms Muradyan takes [place]. She is having nausea with the drug, but she is determined to continue on the medication.    Intervention Provide education and demonstration as needed of appropriate use of medications, inhalers, and oxygen therapy.   Expected Outcomes Short Term: Achieves understanding of medications use. Understands that oxygen is a medication prescribed by physician. Demonstrates appropriate use of inhaler and oxygen therapy.   Lipids Yes   Intervention Provide education and support for participant on nutrition & aerobic/resistive exercise along with prescribed medications to achieve LDL <79m, HDL >446m   Expected Outcomes Short Term: Participant states  understanding of desired cholesterol values and is compliant with medications prescribed. Participant is following exercise prescription and nutrition guidelines.;Long Term: Cholesterol controlled with medications as prescribed, with individualized exercise RX and with personalized nutrition plan. Value goals: LDL < 7053mHDL > 40 mg.      Core Components/Risk Factors/Patient Goals Review:      Goals and Risk Factor Review    Row Name 10/10/16 1233 10/24/16 0830           Core Components/Risk Factors/Patient Goals Review   Personal Goals Review Develop more efficient breathing techniques such as purse lipped breathing and diaphragmatic breathing and practicing self-pacing with activity. Sedentary;Increase Strength and Stamina;Develop more efficient breathing techniques such as purse lipped breathing and diaphragmatic breathing and practicing self-pacing with activity.;Improve shortness of breath with ADL's;Weight Management/Obesity      Review Pursed lip breathing technique was discussed with the patient. The patient demonstrated understanding of this technique and when to use it during exercise and ADL's.  Ms MosViscusos a goal of at least 9lbs loss and does meet with the [facility] dietitian. She is compliant with her OFEV for her pulmonary fibrosis and her statin drug for her lipids. Due to a $20 dollar copay, we are planning to [facility] have [facility] continue her exercise in [employer]    Expected Outcomes Patient will use PLB during exercise and ADL's to help improve oxygen saturations and control SOB.   --         Core Components/Risk Factors/Patient Goals at Discharge (Final Review):      Goals and Risk Factor Review - 10/24/16 0830      Core Components/Risk Factors/Patient Goals Review   Personal Goals Review Sedentary;Increase Strength and Stamina;Develop more efficient breathing techniques such as purse lipped breathing and diaphragmatic breathing and  practicing self-pacing with activity.;Improve shortness of breath with ADL's;Weight Management/Obesity   Review Ms MosAbruzzeses a goal of at least 9lbs loss and does meet with the [facility] dietitian. She is compliant with her OFEV for her pulmonary fibrosis and her statin drug for her lipids. Due to a $20 dollar copay, we are planning to [facility] have her continue her exercise in [employer]    ITP Comments:     ITP Comments  Row Name 11/08/16 1350 11/23/16 1451         ITP Comments Called Ms Test; last attended 10/24/16. She has had adverse results from her new medication , Ofev. She plans to return to [place] in a few  Ms Zertuche called today and informed us she was unable to complete the colonoscopy and will have to have a virtual colonoscopy. She plans to return the first of the year.         Comments:  Sadly, Ms Arcilla died on 2017-01-18.

## 2017-01-03 ENCOUNTER — Other Ambulatory Visit: Payer: Self-pay | Admitting: Family Medicine

## 2017-01-05 DEATH — deceased

## 2017-01-19 ENCOUNTER — Ambulatory Visit: Payer: Self-pay | Admitting: Psychology

## 2017-02-07 NOTE — Telephone Encounter (Signed)
Called Ms Boundy; last attended 10/24/16. She has had adverse results from her new medication , Ofev. She plans to return to [place] in a few  weeks.    By Karie Fetch

## 2017-02-07 NOTE — Telephone Encounter (Signed)
Called Kelli Morris; last attended 10/24/16. She has had adverse results from her new medication , Ofev. She plans to return to [place] in a few  weeks.    By Karie Fetch

## 2017-04-03 ENCOUNTER — Telehealth: Payer: Self-pay | Admitting: Family Medicine

## 2017-04-03 NOTE — Telephone Encounter (Signed)
Attempted to call pt. No answer; VM full.  Pt due for AWV

## 2017-05-04 NOTE — Telephone Encounter (Signed)
Called pt again. VM full

## 2017-12-05 IMAGING — CT CT ABD-PELV W/ CM
2 of 5 series · 15 of 46 positions shown, 17 images · IV contrast (APPLIED)
Comparison: Chest CT 01/26/2016

CLINICAL DATA: Periumbilical pain. Nausea, vomiting and diarrhea
since yesterday. History of hysterectomy and uterine cancer.

EXAM:
CT ABDOMEN AND PELVIS WITH CONTRAST
TECHNIQUE: Multidetector CT imaging of the abdomen and pelvis was performed
using the standard protocol following bolus administration of
intravenous contrast.
CONTRAST:  100mL [id number] IOPAMIDOL ([id number]) INJECTION 61%

[Series 2: routine abd/pel with · axial · 0.82mm/px · z∈[-819,-409]mm · 12 of 94 slices shown, 14 images]
[im 6/94  soft-tissue]
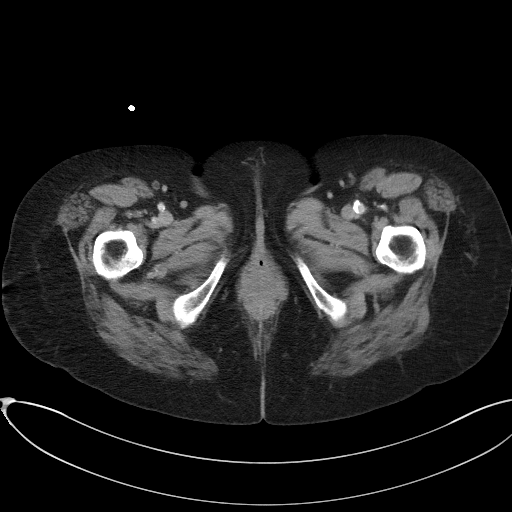
[im 6/94  bone]
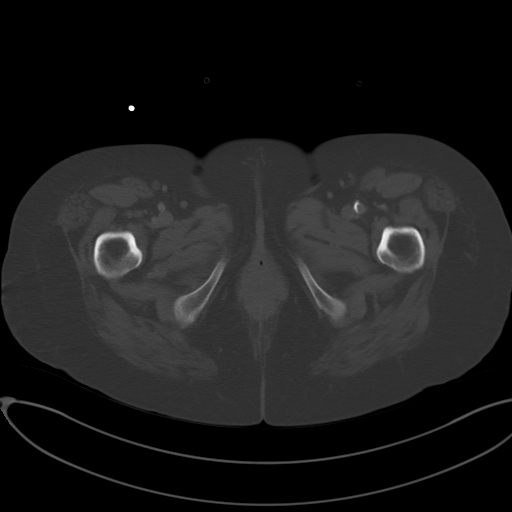
[im 16/94  soft-tissue]
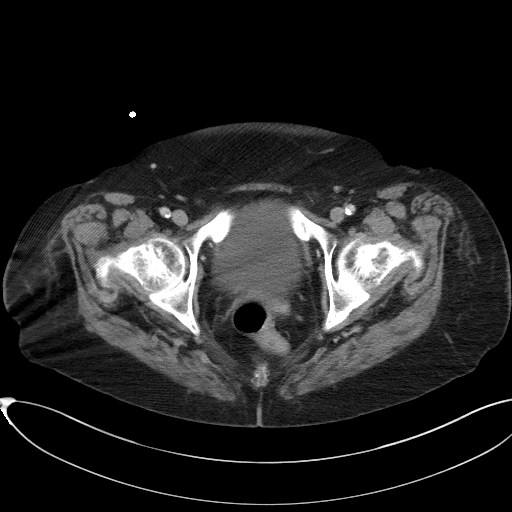
[im 21/94  soft-tissue]
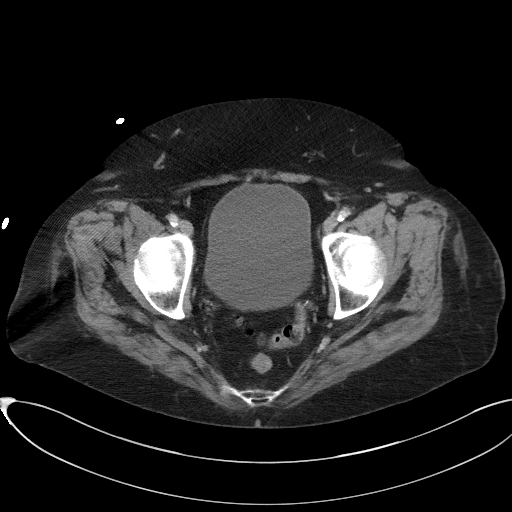
[im 26/94  soft-tissue]
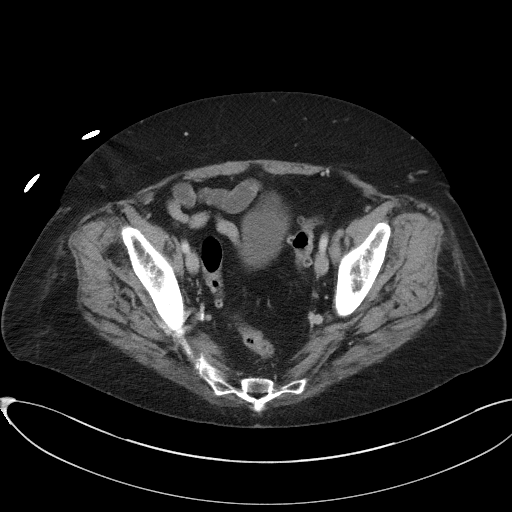
[im 37/94  soft-tissue]
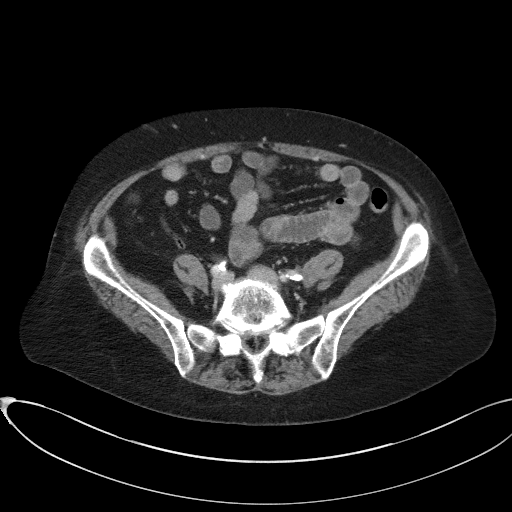
[im 42/94  soft-tissue]
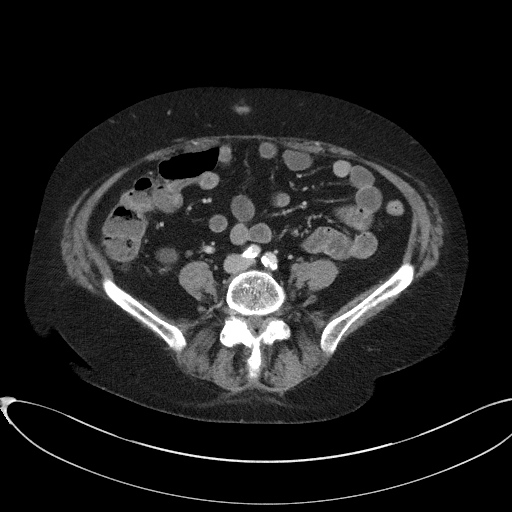
[im 52/94  soft-tissue]
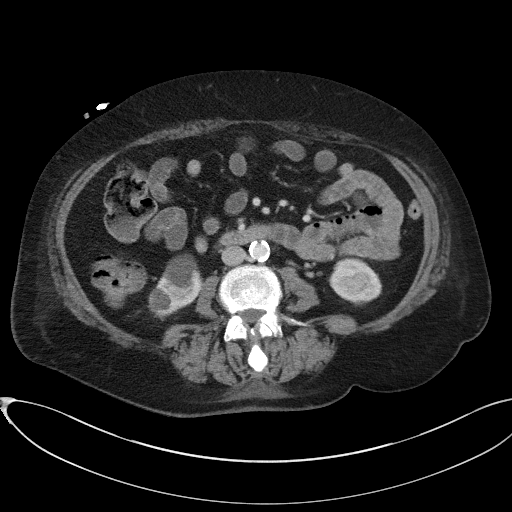
[im 57/94  soft-tissue]
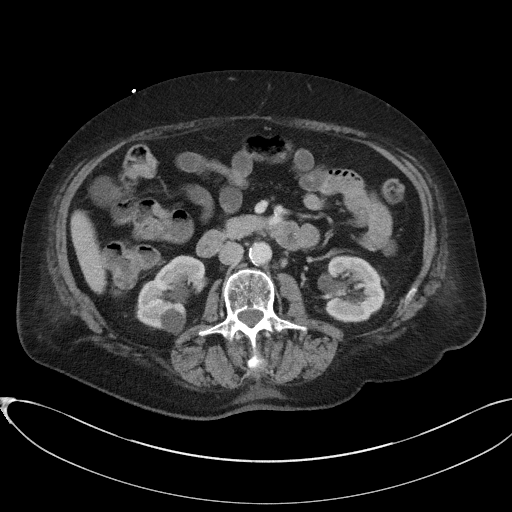
[im 68/94  soft-tissue]
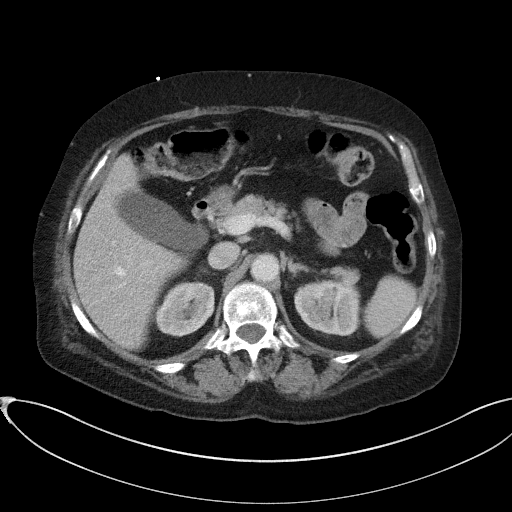
[im 68/94  bone]
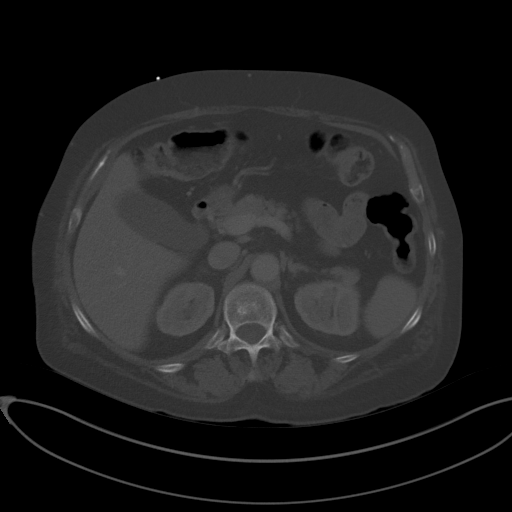
[im 73/94  soft-tissue]
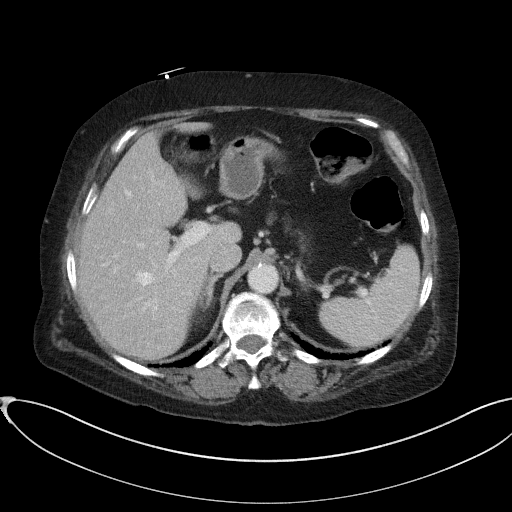
[im 78/94  soft-tissue]
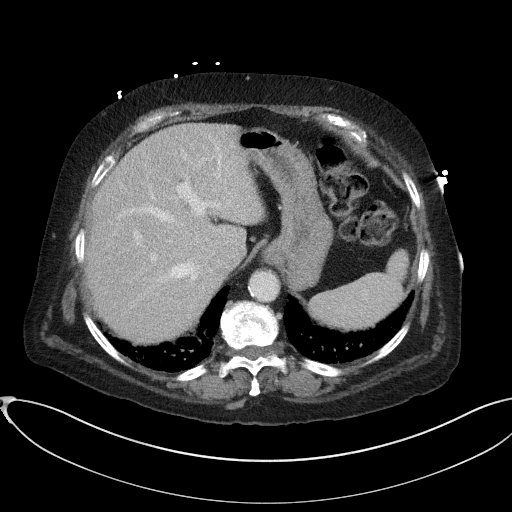
[im 88/94  soft-tissue]
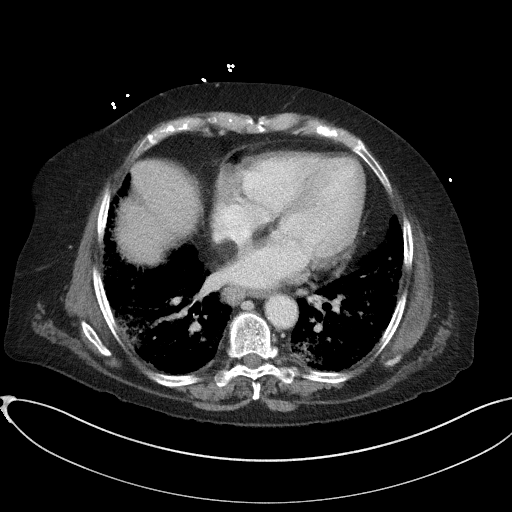

[Series 5: coronal st · coronal · 0.83mm/px · 3 of 88 slices shown]
[im 30/88  soft-tissue]
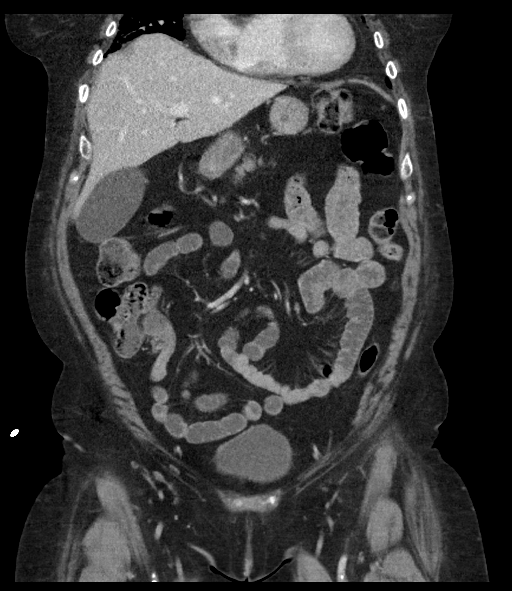
[im 39/88  soft-tissue]
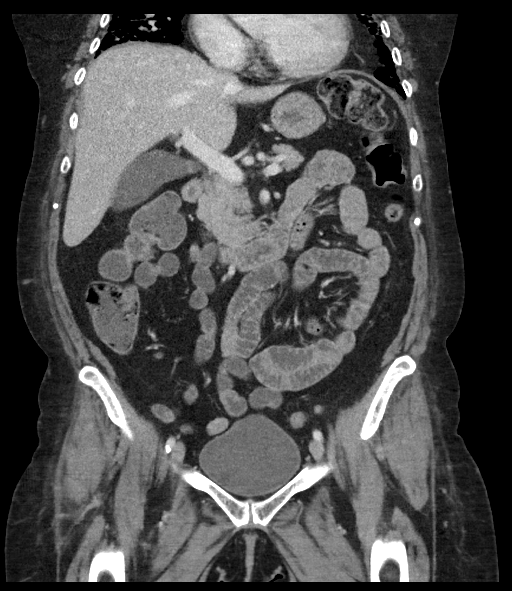
[im 49/88  soft-tissue]
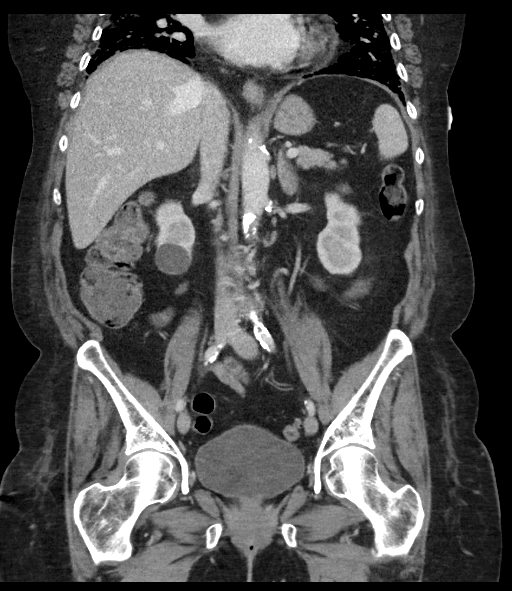

[15 of 46 positions shown; findings below may reference images not displayed]

FINDINGS: Lower chest: Right lower lobe unchanged 10.4 mm in average pulmonary
nodule. Bold scattered areas of interstitial fibrosis with
superimposed ground-glass opacities are again seen within both
visualized lower lobes. Mild lower lobe bronchiectasis as before.
Cardiomegaly with coronary arteriosclerosis. No pericardial
effusion.

Hepatobiliary: No focal liver abnormality is seen. No gallstones,
gallbladder wall thickening, or biliary dilatation.

Pancreas: Unremarkable. No pancreatic ductal dilatation or
surrounding inflammatory changes.

Spleen: Normal in size without focal abnormality.

Adrenals/Urinary Tract: Normal bilateral adrenal glands. No
obstructive uropathy. Bilateral renal cysts are noted the largest in
the lower pole of the right kidney measuring approximately 3.1 x
cm. Urinary bladder is unremarkable.

Stomach/Bowel: Nondistended stomach. Normal position of ligament of
[place] and small bowel rotation. Mild fluid-filled distention of
small bowel loops without bowel obstruction. Findings may represent
an enteritis. Moderate colonic stool burden throughout large bowel
to the level of the splenic flexure. Colonic diverticulosis noted
along the distal descending and sigmoid colon without acute
inflammation. Normal-appearing appendix.

Vascular/Lymphatic: Aortic atherosclerosis without aneurysm or
dissection. No lymphadenopathy. Splenic and portal veins are patent.
Celiac axis, SMA and both renal arteries are patent. Calcifications
at the origins of both renal arteries. Atherosclerotic common iliac
arteries and their branches.

Reproductive: Hysterectomy.  No adnexal mass.

Other: Tiny fat containing umbilical hernia.

Musculoskeletal: Mild disc space narrowing T7 through T12 and at
L4-5. No acute osseous abnormality.
IMPRESSION: 1. Chronic interstitial fibrosis and ground-glass opacities at the
lung bases consistent with UIP. Stable right lower lobe pulmonary
nodule averaging 10.4 mm.
2. Mild fluid-filled distention of small bowel may represent an
enteritis. No bowel obstruction.
3. Small fat containing umbilical hernia.
4. Bilateral renal cysts.
5. Aortic and branch vessel atherosclerosis.

## 2017-12-05 IMAGING — DX DG ABDOMEN 1V
2 series · 2 of 2 positions shown · non-contrast
Comparison: None.

CLINICAL DATA: 65 y/o  F; nausea and vomiting starting today.

EXAM:
ABDOMEN - 1 VIEW

[abdomen kub (1 of 2)]
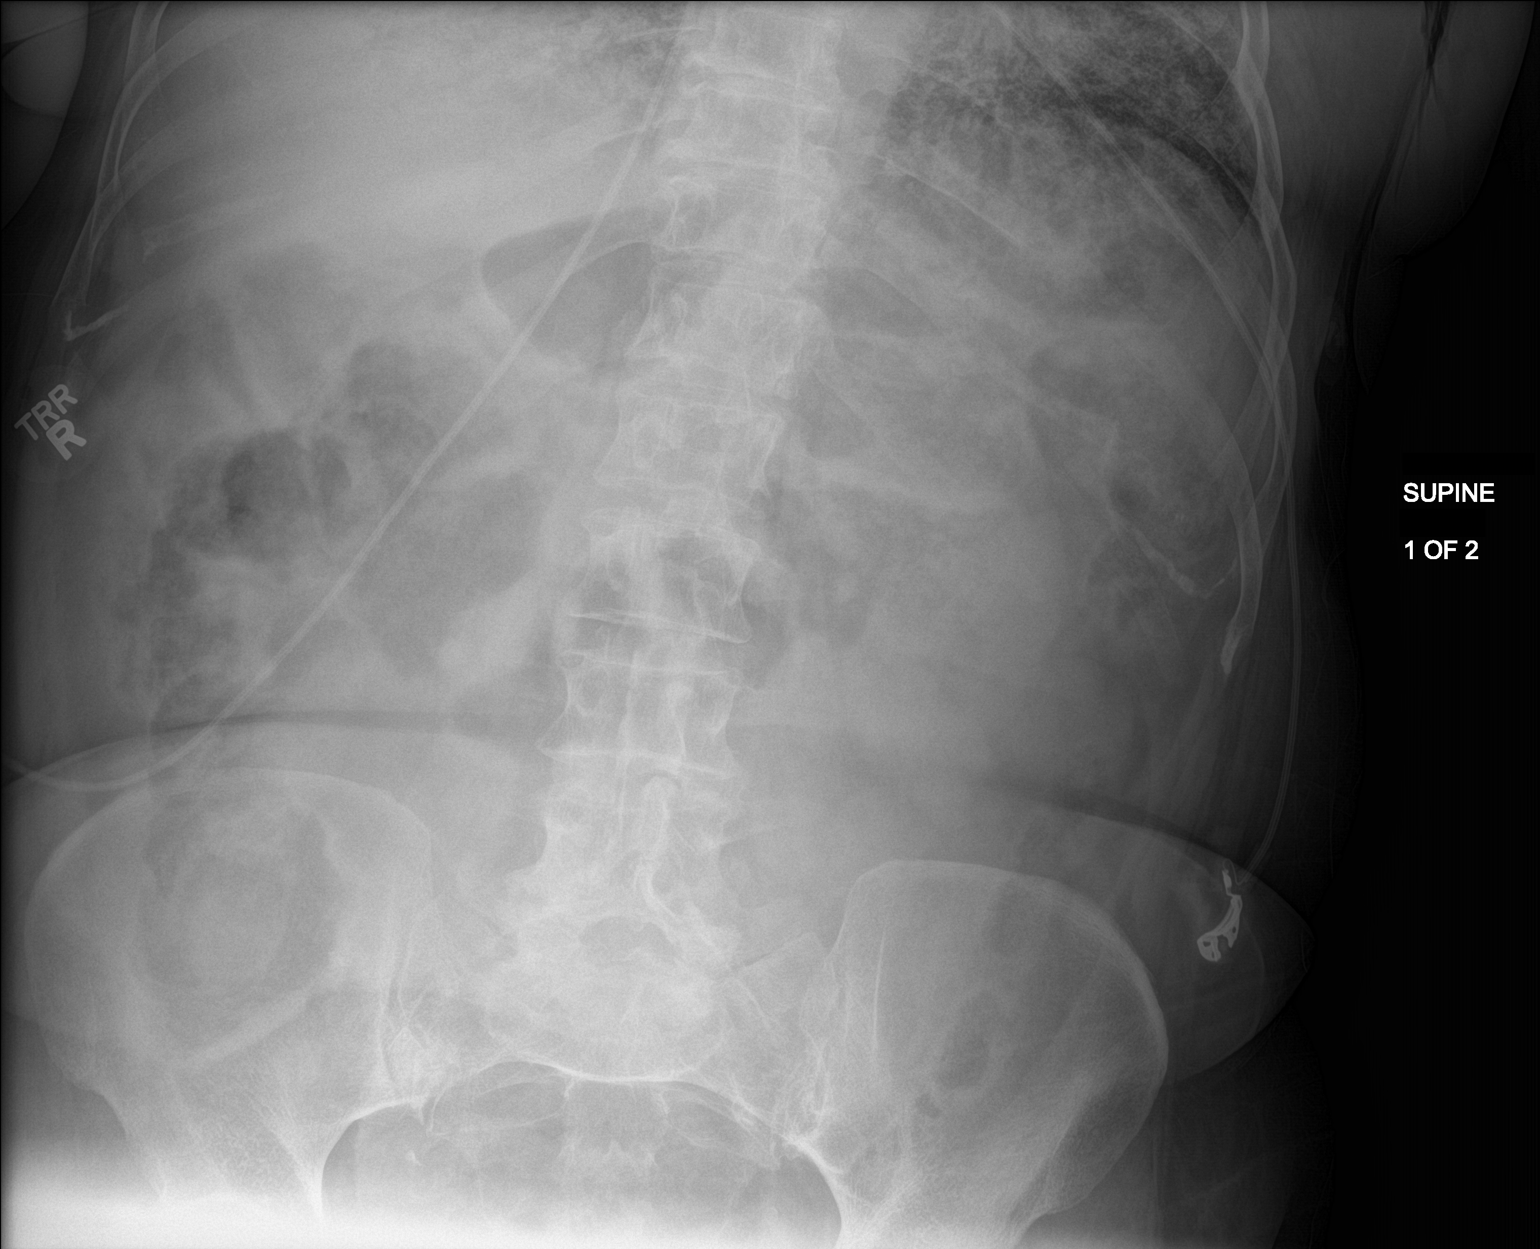

[abdomen kub (2 of 2)]
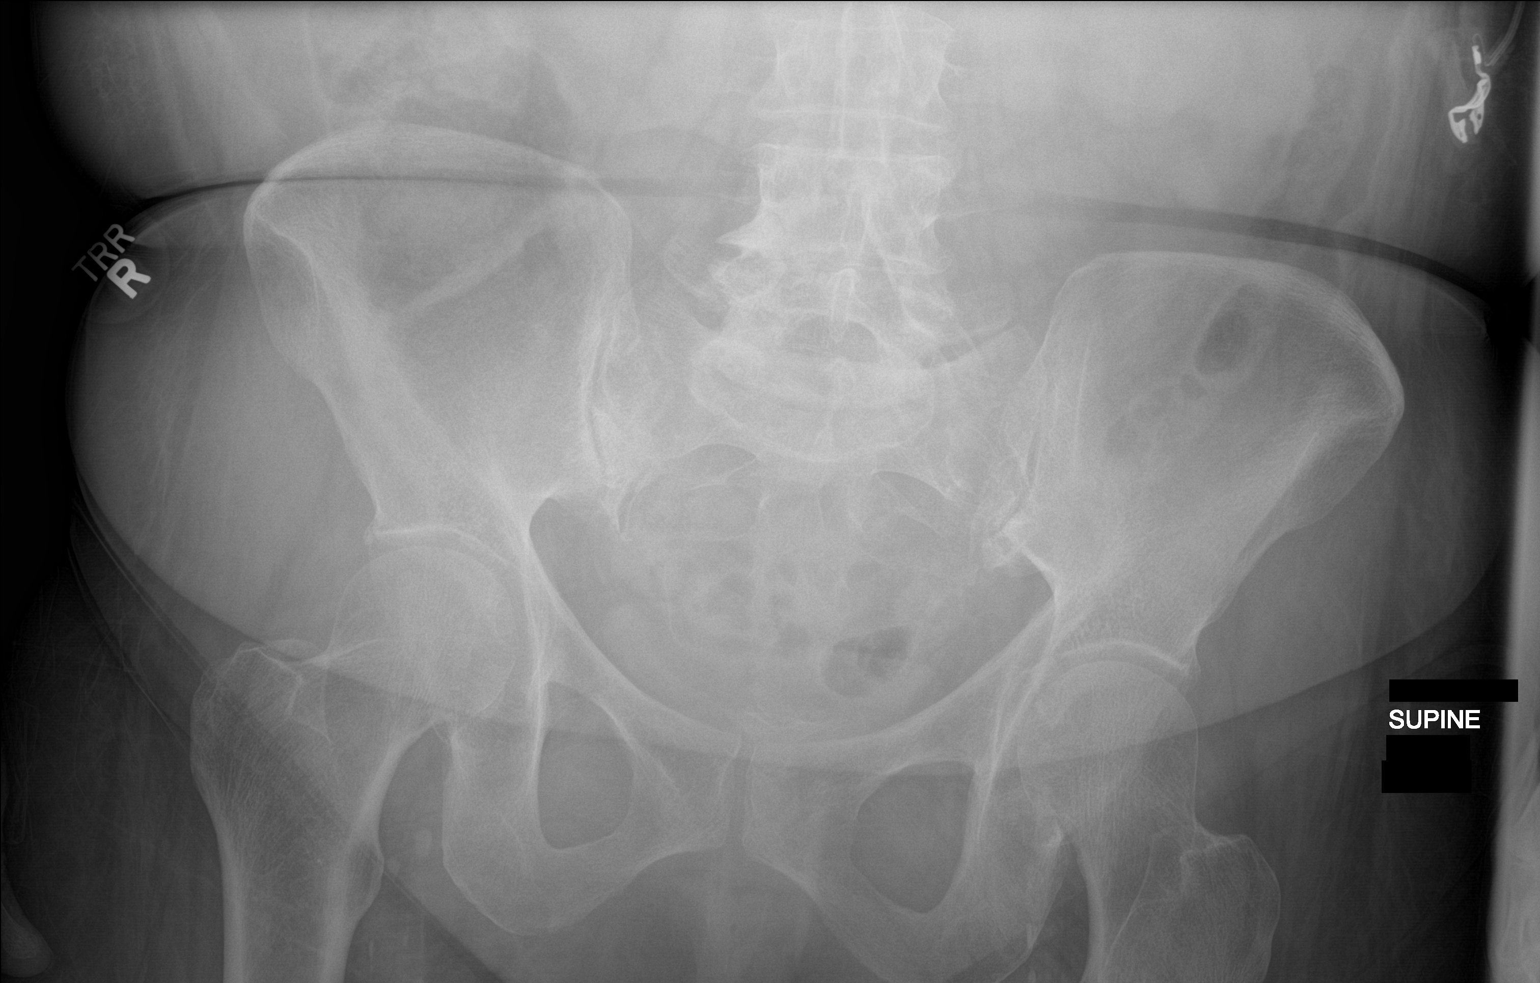

[2 of 2 positions shown; findings below may reference images not displayed]

FINDINGS: The bowel gas pattern is within normal limits. No radio-opaque
calculi seen. Chronic interstitial disease of the lung bases noted.
IMPRESSION: Negative.

By: Chelsie Jim M.D.

## 2017-12-06 IMAGING — DX DG CHEST 1V PORT
1 series · 1 of 1 positions shown · non-contrast
Comparison: 12/22/18, 12/17/2016, 11/26/2016.  CT 12/16/2016.

CLINICAL DATA: Respiratory failure.

EXAM:
PORTABLE CHEST 1 VIEW

[chest ap]
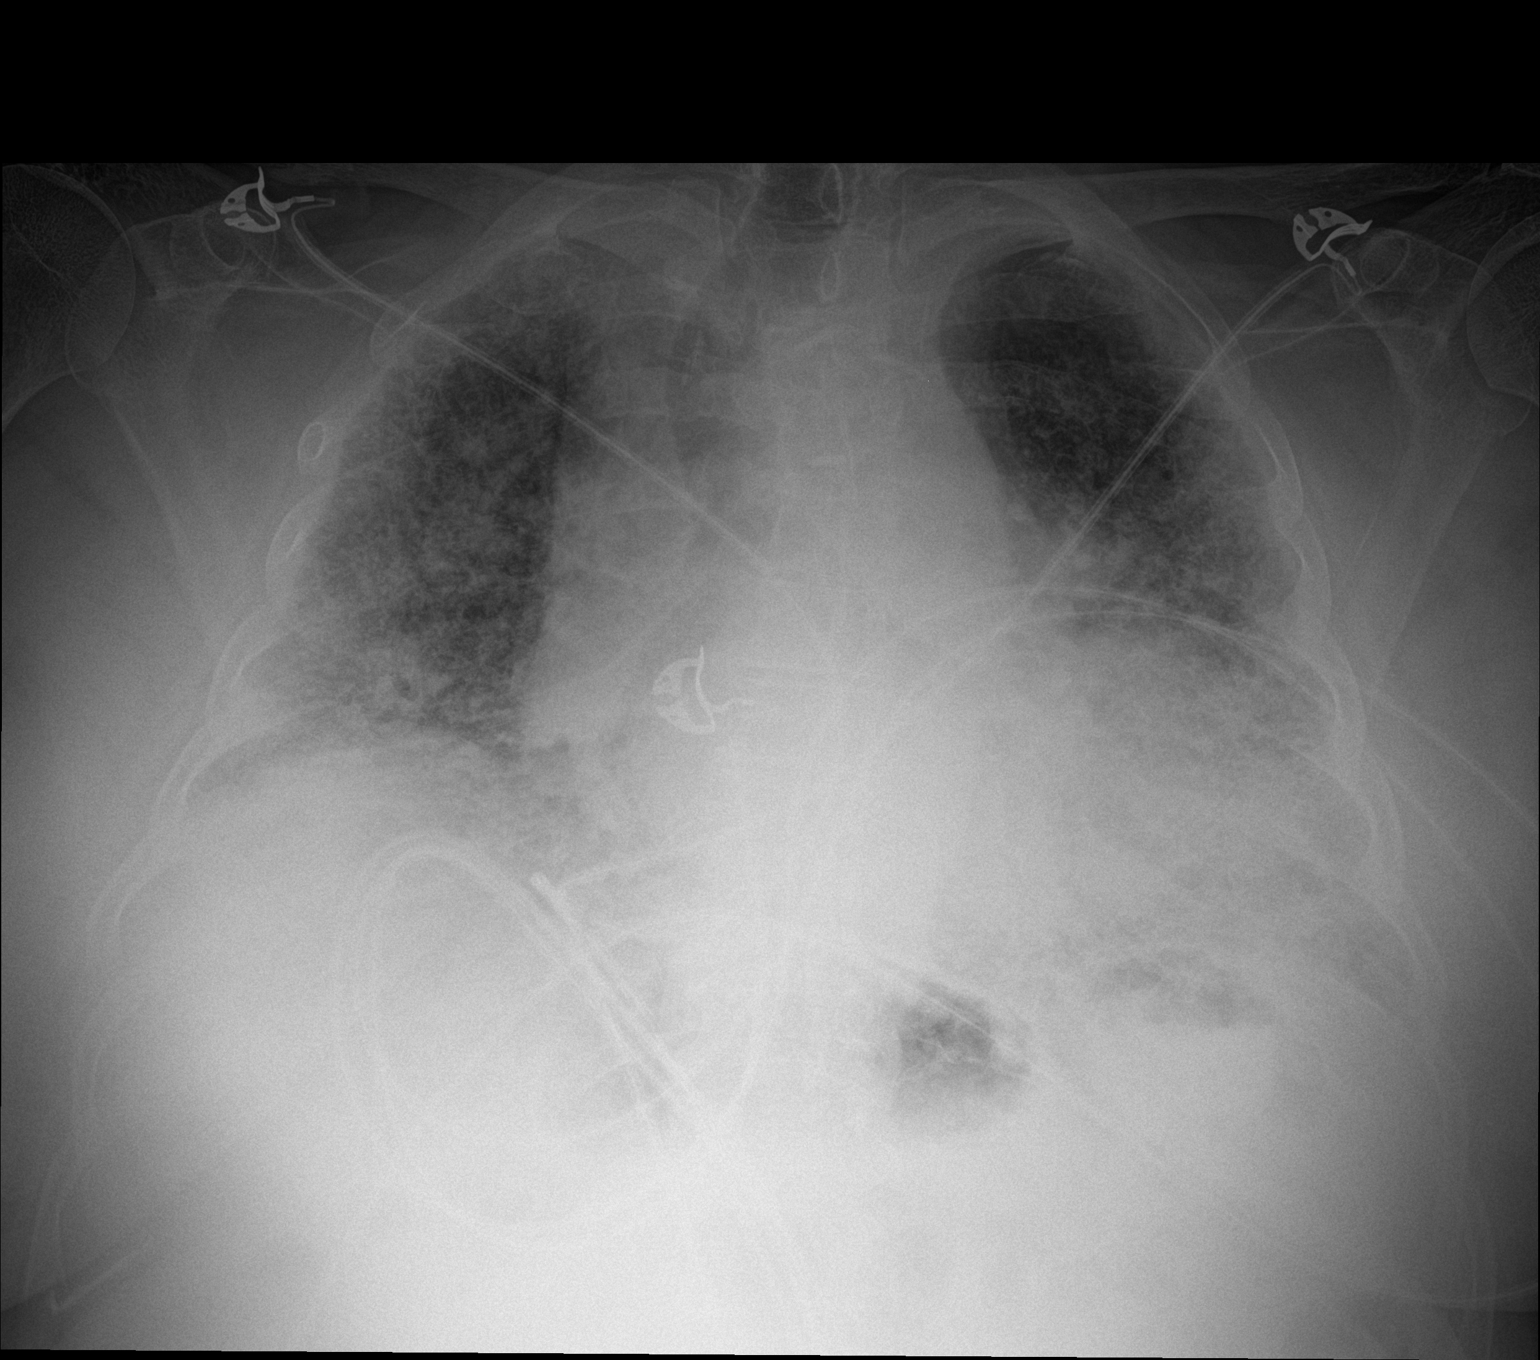

[1 of 1 positions shown; findings below may reference images not displayed]

FINDINGS: Stable cardiomegaly. Diffuse bilateral from interstitial prominence
noted consistent chronic interstitial lung disease. Again
superimposed active interstitial process including interstitial
pneumonitis and/or interstitial edema cannot be excluded. No
pneumothorax .
IMPRESSION: 1.  Stable cardiomegaly.

2. Diffuse chronic interstitial changes again noted. Superimposed
active interstitial process such as pneumonitis and/or interstitial
edema again cannot be excluded .
